# Patient Record
Sex: Female | Born: 1945 | Race: White | Hispanic: No | Marital: Married | Smoking: Former smoker
Health system: Southern US, Community
[De-identification: ages and names within clinical notes are randomized; demographics above are authoritative.]

## PROBLEM LIST (undated history)

## (undated) DIAGNOSIS — R609 Edema, unspecified: Secondary | ICD-10-CM

## (undated) DIAGNOSIS — G629 Polyneuropathy, unspecified: Secondary | ICD-10-CM

## (undated) DIAGNOSIS — T7840XA Allergy, unspecified, initial encounter: Secondary | ICD-10-CM

## (undated) DIAGNOSIS — G600 Hereditary motor and sensory neuropathy: Secondary | ICD-10-CM

## (undated) DIAGNOSIS — R251 Tremor, unspecified: Secondary | ICD-10-CM

## (undated) DIAGNOSIS — R0602 Shortness of breath: Secondary | ICD-10-CM

## (undated) DIAGNOSIS — M1990 Unspecified osteoarthritis, unspecified site: Secondary | ICD-10-CM

## (undated) DIAGNOSIS — S43429A Sprain of unspecified rotator cuff capsule, initial encounter: Principal | ICD-10-CM

## (undated) DIAGNOSIS — E785 Hyperlipidemia, unspecified: Secondary | ICD-10-CM

## (undated) DIAGNOSIS — M549 Dorsalgia, unspecified: Secondary | ICD-10-CM

## (undated) DIAGNOSIS — G8929 Other chronic pain: Secondary | ICD-10-CM

## (undated) DIAGNOSIS — IMO0002 Reserved for concepts with insufficient information to code with codable children: Secondary | ICD-10-CM

## (undated) DIAGNOSIS — K5790 Diverticulosis of intestine, part unspecified, without perforation or abscess without bleeding: Secondary | ICD-10-CM

## (undated) HISTORY — PX: SPINE SURGERY: SHX786

## (undated) HISTORY — PX: ABDOMINAL HYSTERECTOMY: SHX81

## (undated) HISTORY — DX: Hyperlipidemia, unspecified: E78.5

## (undated) HISTORY — PX: KNEE SURGERY: SHX244

## (undated) HISTORY — DX: Unspecified osteoarthritis, unspecified site: M19.90

## (undated) HISTORY — DX: Allergy, unspecified, initial encounter: T78.40XA

## (undated) HISTORY — PX: BACK SURGERY: SHX140

## (undated) HISTORY — DX: Reserved for concepts with insufficient information to code with codable children: IMO0002

## (undated) HISTORY — PX: CHOLECYSTECTOMY: SHX55

## (undated) HISTORY — PX: TONSILLECTOMY: SHX5217

## (undated) HISTORY — PX: FRACTURE SURGERY: SHX138

## (undated) HISTORY — PX: FOOT SURGERY: SHX648

---

## 1999-11-21 ENCOUNTER — Encounter: Payer: Self-pay | Admitting: Orthopedic Surgery

## 1999-11-21 ENCOUNTER — Encounter: Admission: RE | Admit: 1999-11-21 | Discharge: 1999-11-21 | Payer: Self-pay | Admitting: Orthopedic Surgery

## 1999-12-25 ENCOUNTER — Encounter: Payer: Self-pay | Admitting: Orthopedic Surgery

## 1999-12-25 ENCOUNTER — Ambulatory Visit (HOSPITAL_COMMUNITY): Admission: RE | Admit: 1999-12-25 | Discharge: 1999-12-25 | Payer: Self-pay | Admitting: Orthopedic Surgery

## 2000-01-08 ENCOUNTER — Ambulatory Visit (HOSPITAL_COMMUNITY): Admission: RE | Admit: 2000-01-08 | Discharge: 2000-01-08 | Payer: Self-pay | Admitting: Orthopedic Surgery

## 2000-01-08 ENCOUNTER — Encounter: Payer: Self-pay | Admitting: Orthopedic Surgery

## 2000-01-22 ENCOUNTER — Encounter: Payer: Self-pay | Admitting: Orthopedic Surgery

## 2000-01-22 ENCOUNTER — Ambulatory Visit (HOSPITAL_COMMUNITY): Admission: RE | Admit: 2000-01-22 | Discharge: 2000-01-22 | Payer: Self-pay | Admitting: Orthopedic Surgery

## 2000-01-25 ENCOUNTER — Encounter: Payer: Self-pay | Admitting: Specialist

## 2000-01-25 ENCOUNTER — Encounter: Admission: RE | Admit: 2000-01-25 | Discharge: 2000-01-25 | Payer: Self-pay | Admitting: Specialist

## 2000-09-17 ENCOUNTER — Encounter: Payer: Self-pay | Admitting: Orthopedic Surgery

## 2000-09-17 ENCOUNTER — Ambulatory Visit (HOSPITAL_COMMUNITY): Admission: RE | Admit: 2000-09-17 | Discharge: 2000-09-17 | Payer: Self-pay | Admitting: Orthopedic Surgery

## 2001-02-11 ENCOUNTER — Encounter: Admission: RE | Admit: 2001-02-11 | Discharge: 2001-02-11 | Payer: Self-pay | Admitting: Obstetrics and Gynecology

## 2001-02-11 ENCOUNTER — Encounter: Payer: Self-pay | Admitting: Obstetrics and Gynecology

## 2001-05-05 ENCOUNTER — Encounter: Payer: Self-pay | Admitting: Neurosurgery

## 2001-05-07 ENCOUNTER — Inpatient Hospital Stay (HOSPITAL_COMMUNITY): Admission: RE | Admit: 2001-05-07 | Discharge: 2001-05-10 | Payer: Self-pay | Admitting: Neurosurgery

## 2001-05-07 ENCOUNTER — Encounter: Payer: Self-pay | Admitting: Neurosurgery

## 2001-06-06 ENCOUNTER — Encounter: Payer: Self-pay | Admitting: Neurosurgery

## 2001-06-06 ENCOUNTER — Ambulatory Visit (HOSPITAL_COMMUNITY): Admission: RE | Admit: 2001-06-06 | Discharge: 2001-06-06 | Payer: Self-pay | Admitting: Neurosurgery

## 2001-08-06 ENCOUNTER — Encounter: Payer: Self-pay | Admitting: Neurosurgery

## 2001-08-06 ENCOUNTER — Ambulatory Visit (HOSPITAL_COMMUNITY): Admission: RE | Admit: 2001-08-06 | Discharge: 2001-08-06 | Payer: Self-pay | Admitting: Neurosurgery

## 2001-08-11 ENCOUNTER — Encounter: Admission: RE | Admit: 2001-08-11 | Discharge: 2001-11-09 | Payer: Self-pay | Admitting: Neurosurgery

## 2002-02-19 ENCOUNTER — Encounter: Admission: RE | Admit: 2002-02-19 | Discharge: 2002-02-19 | Payer: Self-pay | Admitting: Specialist

## 2002-02-19 ENCOUNTER — Encounter: Payer: Self-pay | Admitting: Specialist

## 2002-03-11 ENCOUNTER — Encounter: Payer: Self-pay | Admitting: Neurosurgery

## 2002-03-11 ENCOUNTER — Encounter: Admission: RE | Admit: 2002-03-11 | Discharge: 2002-03-11 | Payer: Self-pay | Admitting: Neurosurgery

## 2002-11-17 ENCOUNTER — Encounter: Payer: Self-pay | Admitting: Family Medicine

## 2002-11-17 ENCOUNTER — Ambulatory Visit (HOSPITAL_COMMUNITY): Admission: RE | Admit: 2002-11-17 | Discharge: 2002-11-17 | Payer: Self-pay | Admitting: Family Medicine

## 2003-04-23 ENCOUNTER — Encounter: Admission: RE | Admit: 2003-04-23 | Discharge: 2003-04-23 | Payer: Self-pay | Admitting: Specialist

## 2003-04-23 ENCOUNTER — Encounter: Payer: Self-pay | Admitting: Specialist

## 2006-06-15 ENCOUNTER — Emergency Department (HOSPITAL_COMMUNITY): Admission: EM | Admit: 2006-06-15 | Discharge: 2006-06-15 | Payer: Self-pay | Admitting: Emergency Medicine

## 2007-11-26 ENCOUNTER — Encounter: Admission: RE | Admit: 2007-11-26 | Discharge: 2007-11-26 | Payer: Self-pay | Admitting: Neurosurgery

## 2007-12-09 ENCOUNTER — Inpatient Hospital Stay (HOSPITAL_COMMUNITY): Admission: RE | Admit: 2007-12-09 | Discharge: 2007-12-12 | Payer: Self-pay | Admitting: Neurosurgery

## 2008-02-15 ENCOUNTER — Encounter: Admission: RE | Admit: 2008-02-15 | Discharge: 2008-02-15 | Payer: Self-pay | Admitting: Neurosurgery

## 2008-06-10 ENCOUNTER — Encounter: Admission: RE | Admit: 2008-06-10 | Discharge: 2008-06-10 | Payer: Self-pay | Admitting: Neurosurgery

## 2008-07-05 ENCOUNTER — Inpatient Hospital Stay (HOSPITAL_COMMUNITY): Admission: RE | Admit: 2008-07-05 | Discharge: 2008-07-07 | Payer: Self-pay | Admitting: Neurosurgery

## 2008-10-12 ENCOUNTER — Encounter: Admission: RE | Admit: 2008-10-12 | Discharge: 2008-10-12 | Payer: Self-pay | Admitting: Neurosurgery

## 2010-12-25 ENCOUNTER — Ambulatory Visit: Payer: Medicare Other | Attending: Rehabilitation | Admitting: *Deleted

## 2010-12-25 DIAGNOSIS — M6281 Muscle weakness (generalized): Secondary | ICD-10-CM | POA: Insufficient documentation

## 2010-12-25 DIAGNOSIS — IMO0001 Reserved for inherently not codable concepts without codable children: Secondary | ICD-10-CM | POA: Insufficient documentation

## 2010-12-25 DIAGNOSIS — R269 Unspecified abnormalities of gait and mobility: Secondary | ICD-10-CM | POA: Insufficient documentation

## 2010-12-29 ENCOUNTER — Encounter: Payer: Self-pay | Admitting: Occupational Therapy

## 2011-01-03 ENCOUNTER — Ambulatory Visit: Payer: Medicare Other | Attending: Rehabilitation | Admitting: Occupational Therapy

## 2011-01-03 DIAGNOSIS — M6281 Muscle weakness (generalized): Secondary | ICD-10-CM | POA: Insufficient documentation

## 2011-01-03 DIAGNOSIS — R269 Unspecified abnormalities of gait and mobility: Secondary | ICD-10-CM | POA: Insufficient documentation

## 2011-01-03 DIAGNOSIS — IMO0001 Reserved for inherently not codable concepts without codable children: Secondary | ICD-10-CM | POA: Insufficient documentation

## 2011-01-08 ENCOUNTER — Ambulatory Visit: Payer: Medicare Other | Admitting: *Deleted

## 2011-01-09 NOTE — Discharge Summary (Signed)
NAME:  Haley Kennedy, Haley Kennedy                 ACCOUNT NO.:  [id number]   MEDICAL RECORD NO.:  [id number]          PATIENT TYPE:  INP   LOCATION:  3015                         FACILITY:  [facility]   PHYSICIAN:  Sherilyn Cooter A. Pool, M.D.    DATE OF BIRTH:  May 28, 1946   DATE OF ADMISSION:  12/09/2007  DATE OF DISCHARGE:  12/12/2007                               DISCHARGE SUMMARY   FINAL DIAGNOSES:  1. L3-L4 degenerative disk disease with stenosis and persistent back      and lower extremity pain.  2. Unspecified neuromuscular disorder, chronic.   HISTORY OF PRESENT ILLNESS:  Ms. 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  The patient is status post previous L4-L5  fusion with reasonably good results.  She presents now with worsening  back and bilateral lower extremity symptoms.  Workup demonstrates  evidence of breakdown and stenosis at the L3-L4 level.  The patient  presents now for L3-4 decompression and fusion.   OPERATIVE NOTE:  The patient was taken to the operating room where an  uncomplicated L3-L4 decompression and fusion instrumentation was  performed.   HOSPITAL COURSE:  Postoperatively, the patient did well.  Her lower  extremity pain was much improved.  Her back pain was significantly  better.  Her neuromuscular disease did make mobilization somewhat more  problematic.  This was achieved with physical and occupational therapy.  The patient was gradually able to become independent again.  At the time  of discharge, she was ambulating with minimal assistance.  Her wound was  healing well.  Her neurological exam was stable.   CONDITION ON DISCHARGE:  Improved.   DISCHARGE DISPOSITION:  The patient will be discharged home.  She will  follow up in my office in 1 week.           ______________________________  Kathaleen Maser. Pool,  M.D.     HAP/MEDQ  D:  01/27/2008  T:  01/27/2008  Job:  [id number]

## 2011-01-09 NOTE — Op Note (Signed)
NAME:  Haley Kennedy, Haley Kennedy                 ACCOUNT NO.:  [id number]   MEDICAL RECORD NO.:  [id number]          PATIENT TYPE:  INP   LOCATION:  3015                         FACILITY:  [facility]   PHYSICIAN:  Sherilyn Cooter A. Pool, M.D.    DATE OF BIRTH:  [birth year]   DATE OF PROCEDURE:  12/09/2007  DATE OF DISCHARGE:                               OPERATIVE REPORT   PREOPERATIVE DIAGNOSES:  1. L3-L4 stenosis with disk degeneration and facet arthropathy and      evidence of segmental instability.  2. Status post L4-L5 decompression and fusion with instrumentation.   POSTOPERATIVE DIAGNOSES:  1. L3-L4 stenosis with disk degeneration and facet arthropathy and      evidence of segmental instability.  2. Status post L4-L5 decompression and fusion with instrumentation.   PROCEDURE:  1. Redo decompressive laminectomy of L3-L4 nerve roots bilaterally,      more than what we required for a interbody fusion alone.  2. L3-L4 posterior lumbar interbody fusion of the tangent interbody      allograft wedge, interbody PEEK cage, and local autografting.  3. L3-4 posterolateral arthrodesis utilizing nonsegmental pedicle      screw fixation and local autografting.  4. Re-exploration of L4-L5 fusion with removal of hardware.   SURGEON:  Kathaleen Maser. Pool, MD   ASSISTANT:  Reinaldo Meeker, MD   ANESTHESIA:  General.   INDICATIONS FOR PROCEDURE:  Ms. Irby is a 65 year old female with a  history of previous L4-L5 decompression and fusion done for a  degenerative spondylolisthesis.  The patient had progressively worsening  back and bilateral lower extremity pain over the past year.  Workup  demonstrates evidence of segmental breakdown at both the level of her  fusion with marked facet arthropathy and stenosis.  The patient was  counseled as to her options.  She decided proceed with L3-L4  decompression and fusion.  Preop workup suggests solid fusion at L4-L5.   OPERATION:  The patient placed on the operative table in a  supine  position.  After a level of anesthesia was achieved, the patient was  prone on Wilson frame firmly padded.  The patient's lumbar regions  prepped and draped.  A #10 blade was used to make a curvilinear skin  incision overlying the L2, L3, L4, and L5 levels.  This was carried down  sharply in the midline.  A subperiosteal dissection was then performed  exposing the lamina and facet joints of L3 as well as the facet complex  and instrumentation of L4-L5.  Deep self-retaining retractor was placed.  Intraoperative fluoroscopy was used and levels were confirmed.  The  pedicle screw fixation at L4-L5 was disassembled and removed.  The  fusion at L4-L5 was inspected and found to be quite solid.  A 6.75 x 40  mm radius screws were then placed into L4 using the preexisting holes  with good purchase.  The previous laminectomy defect extending up to the  inferior aspect of the lamina of L3 was dissected free.  A complete  laminectomy of L3 was then performed using Leksell rongeurs, Kerrison  rongeurs,  and high-speed drill.  Inferior facetectomies of L3 were  performed bilaterally.  Superior facetectomies of L4 were performed  bilaterally.  Ligamentum flavum and epidural scar were then elevated and  resected in usual fashion using Kerrison rongeurs in the thecal sac and  exiting L3-L4 nerve roots were identified bilaterally and wide  decompressive foraminotomies were then performed along the course of the  exiting nerve roots.  Wound was then irrigated.  Hemostasis was then  achieved with bipolar electrocautery.  Starting first at the patient's  left side, thecal sac nerve was gently mobilized and tracked towards the  midline.  Disk space then incised with #15 blade in a rectangular  fashion.  Wide disk space was achieved using pituitary rongeurs,  up and  downbiting micropituitary rongeurs, and Epstein curettes.  Disk space  then sequentially dilated up to 10 mm with a 10 mm distractor left  in  place.  Thecal sac nerve was reflected on the right side.  Diskectomy  was then performed.  Subsequently, the disk was then reamed and cut with  10 mm tangent instrument.  Soft tissues were then removed from the  interspace.  A 10 x 26 mm tangent wedge was then packed into place and  recessed roughly 1 mm from posterior cortical margin of L3-L4.  Distractors were removed from the patient's left side.  Thecal sac nerve  was reflected on the left side.  Disk space was once again reamed and  then cut with 10 mm tangent instruments.  Soft tissues were removed from  the interspace.  Disk space further curettaged.  Morselized autograft  was then packed into interspace.  A 10 x 26 mm Telamon cage packed with  morselized autograft and DuraGen matrix putty was then packed into place  and recessed roughly 1 mm from the posterior cortical margin of L3-L4.  Pedicles of L3 were then identified using surface landmarks and  intraoperative fluoroscopy bilaterally.  Superficial bone around the  pedicle was then removed using high-speed drill.  Each pedicle was then  probed using pedicle awl.  Pedicle awl tract was then tapped with a 5.25  mm screw tapper.  Each screw tap hole was probed and found to be solid  with bone.  A 6.75 x 45 mm radius screws were placed bilaterally at L3.  Transverse processes of L3-L4 were then decorticated using high-speed  drill.  Morselized autograft was packed posterolaterally for later  fusion.  Short segment titanium rod was placed over the screw heads at  L3-L4.  Locking caps were then placed over the screws.  Locking caps  were then engaged to construct under compression.  Final images revealed  good position of bone grafts and hardware with proper operative level  and normal alignment of spine.  Wound was then irrigated with antibiotic  solution.  Gelfoam was placed topically.  Hemostasis was found to be  good.  Retractor system was removed.  Hemostasis achieved  with  electrocautery.  The wound was then closed in layers of Vicryl sutures.  Steri-Strips and sterile dressing were applied.  There were no  complications.  She tolerated the procedure well and she returns to  recovery room postoperatively.           ______________________________  Kathaleen Maser Pool, M.D.     HAP/MEDQ  D:  12/09/2007  T:  12/10/2007  Job:  [id number]

## 2011-01-09 NOTE — Op Note (Signed)
NAME:  Haley Kennedy, Haley Kennedy                 ACCOUNT NO.:  [id number]   MEDICAL RECORD NO.:  [id number]          PATIENT TYPE:  INP   LOCATION:  3005                         FACILITY:  [facility]   PHYSICIAN:  Sherilyn Cooter A. Pool, M.D.    DATE OF BIRTH:  02/27/1946   DATE OF PROCEDURE:  07/05/2008  DATE OF DISCHARGE:                               OPERATIVE REPORT   PREOPERATIVE DIAGNOSIS:  L2-3 instability with stenosis.   POSTOPERATIVE DIAGNOSIS:  L2-3 instability with stenosis.   PROCEDURE NOTE:  Re-exploration of L3-4 fusion with removal of hardware.  L2-3 redo decompressive laminectomy and bilateral L2 and L3  foraminotomies, more than would be required for simple interbody fusion  alone.  L2-3 posterior lumbar interbody fusion utilizing tangent  interbody allograft wedge, Telamon interbody PEEK cage and local  autografting.  L2, L3, L4 posterolateral arthrodesis utilizing segmental  pedicle screw fixation and local autografting.   SURGEON:  Kathaleen Maser. Pool, MD   ASSISTANT:  Donalee Citrin, MD   ANESTHESIA:  General endotracheal.   INDICATIONS:  Haley Kennedy is a 65 year old female status post previous L3-4  and L4-5 decompression and fusion and instrumentation.  The patient has  remaining hardware in place at L3-4.  The patient has evidence of  instability with stenosis at the L2-3 level secondary to hyperlordosis.  The patient presents now for decompression and fusion at L2-3 in hopes  of improving her symptoms.   OPERATIVE NOTE:  The patient was brought to the operating room and  placed on the operating table in a supine position.  After adequate  level of anesthesia was achieved, the patient was placed prone onto a  Wilson frame.  Appropriately padded, the patient's lumbar regions were  prepped and draped sterilely.  A #10 blade was used to make a  curvilinear skin incision overlying the L2, L3, and L4 levels.  This was  carried down sharply in the midline.  A subperiosteal dissection was  then  performed exposing the lamina and facet joints of L2, L3, and L4 as  well as the pedicle screw fixation at L3-4.  Deep self-retaining  retractor was placed.  Intraoperative fluoroscopy was used and the  levels were confirmed.  Pedicle screw fixation at L3-4 was disassembled  and the rod was removed.  The fusion at L3-4 was explored and found to  be solid but as the fusion was less than 1 year in age it was felt that  it probably have not matured to the point where all instrumentation  could be completely removed.  Attention then placed to L2-3 level.  Decompressive laminectomy at L2 was then performed by first resecting  the epidural scar.  The entire lamina of L2 was then removed.  Inferior  facetectomies of L2 were performed bilaterally.  Superior facetectomies  of L3 performed bilaterally.  Epidural venous plexus was coagulated and  cut.  The ligamentum flavum and epidural scar were resected along the  course of exiting nerve roots.  Wide decompressive foraminotomies were  then performed along the course of exiting L2 and L3 nerve roots  bilaterally.  Bilateral diskectomies were then performed at L2-3.  Disk  space then subsequently distracted up to 12 mm using a 12-mm retractor  from the patient's right side.  Thecal sac and nerve root were protected  on the left side.  Disk space then reamed and then cut with 10-mm  tangent instrument.  Soft tissues then removed from the interspace.  A  10 x 22 mm Telamon cage packed with morselized autograft was then packed  into interspace and then recessed roughly 2 mm from posterior cortical  margin of L2.  Distractor was removed from the patient's right side.  Thecal sac and nerve roots were protected on the right side.  Disk space  once again reamed and then cut with a 12-mm tangent instrument.  Soft  tissues then removed from the interspace.  Soft tissues then further  removed from the interspace.  Morselized autograft was then impacted  into  interspace.  A 12 x 26 mm tangent wedge then impacted into place  and recessed roughly 1-2 mm from posterior cortical margin of L2.  Pedicles of L2 were then identified using surface landmarks using  intraoperative fluoroscopy.  The superficial bone overlying the pedicle  was then removed using high-speed drill.  Each pedicle was then probed  using pedicle awl.  Each pedicle awl track was then tapped with a 5.25  screw tapper.  Each screw tap hole was then probed and found to be solid  bone.  A 6.75 x 45  mm radius screws were placed bilaterally at L2.  Transverse processes of L2 and L3 were then decorticated using high  speed drill.  Morselized autograft was packed posterolaterally for later  fusion.  The short segment titanium rod was then contoured and placed  through the screw heads at L2, L3 and L4.  Locking caps were placed over  the screw heads and the locking caps were then engaged with the  construct under compression.  Final images revealed good position of  bone grafts, hardware with proper operative level and  normalized spine.  Transverse connector was placed.  A medium Hemovac drain was left in  interspace.  The wound was then irrigated one final time and then closed  in a typical fashion.  Steri-Strips and sterile dressings were applied.  There were no complications.  The patient tolerated the procedure well  and she returns to recovery room postoperatively.           ______________________________  Kathaleen Maser Pool, M.D.     HAP/MEDQ  D:  07/05/2008  T:  07/06/2008  Job:  [id number]

## 2011-01-11 ENCOUNTER — Ambulatory Visit: Payer: Medicare Other | Admitting: Occupational Therapy

## 2011-01-12 NOTE — Consult Note (Signed)
NAME:  Haley Kennedy, COLLA NO.:  [id number]   MEDICAL RECORD NO.:  [id number]          PATIENT TYPE:  EMS   LOCATION:  ED                           FACILITY:  [facility]   PHYSICIAN:  Erasmo Leventhal, M.D.DATE OF BIRTH:  February 22, 1946   DATE OF CONSULTATION:  06/15/2006  DATE OF DISCHARGE:                                   CONSULTATION   DATE OF CONSULTATION:  June 15, 2006.   TIME SEEN:  7 p.m.   HISTORY OF PRESENT ILLNESS:  Haley Kennedy is a very pleasant, 65 year old,  Caucasian female patient of Dr. Jeannetta Ellis.  She fell today sustaining an  injury to her left foot.  She presented to [facility] ED, was evaluated and  felt to have a possible fracture.  I was in the area and I was asked to see  the patient.   PAST MEDICAL HISTORY:  1. Polio.  2. She had surgery of her left foot.   PHYSICAL EXAMINATION:  GENERAL:  She is awake and alert.  She is a very  pleasant lady in moderate discomfort.  LEFT LOWER EXTREMITY:  Her left foot shows a congenital shortage.  She has a  2+ dorsal ecchymosis and swelling.  Compartments are soft.  She is tender in  her mid foot, and neurovascular examination is intact as far as color,  sensation, and capillary refill.  The ankle was unremarkable.   Plain x-rays were reviewed, and it looks like she has had the fusion; it  appears be a triple arthrodesis on plain x-ray.  There is no apparent acute  fracture, but again difficult to ascertain secondary to the old fusion.  CT  scan was recommended and obtained.  It shows the old fusion, what appears to  be a triple arthrodesis, and probably an acute fracture off the cuneiform.   IMPRESSION:  Left foot pain.  She has an old triple arthrodesis with an  acute cuneiform fracture.   RECOMMENDATIONS:  I discussed with patient and her husband and recommend  home, elevation to level of heart, ice, Jones dressing, CAM walker, non-  weightbearing.  Follow up in the office in about a week for  compare to our x-  rays in the office.  A prescription was given for Dilaudid.  All questions  were encouraged and answered by the patient and her husband.           ______________________________  Erasmo Leventhal, M.D.     RAC/MEDQ  D:  06/15/2006  T:  06/16/2006  Job:  [id number]

## 2011-01-12 NOTE — Discharge Summary (Signed)
[facility]  Patient:    Haley Kennedy, Haley Kennedy Visit Number: [id number] MRN: [id number]          Service Type: SUR Location: 3000 3004 01 Attending Physician:  Donn Pierini Dictated by:   Julio Sicks, M.D. Admit Date:  05/07/2001 Discharge Date: 05/10/2001                             Discharge Summary  DATE OF PROCEDURE:  May 07, 2001  SERVICE:  Neurosurgery.  PREOPERATIVE DIAGNOSIS:  L4-5 stenosis with instability and chronic pain.  POSTOPERATIVE DIAGNOSIS:  L4-5 stenosis with instability and chronic pain.  OPERATIONS/TREATMENTS: 1. L4-5 decompressive laminectomy with foraminotomy. 2. L4-5 posterior autobody fusion with tangent wedges and local autograft. 3. L4-5 posterior lateral fusion with pedicle screw instrumentation and local    autograft.  HISTORY OF PRESENT ILLNESS:  The patient is a 65 year old female with a history of chronic back and bilateral lower extremity pain.  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  She was walks with somewhat of a Trendelenburg gait and hyperlordotic posture.  MRI scanning demonstrates severe facet arthropathy with moderately severe spinal stenosis at L4-5.  We discussed options now for management.  She has failed conservative management.  She has decided to proceed with an L4-5 decompression and fusion in hopes of improving her symptoms.  HOSPITAL COURSE:  The patient was taken to the operating room where an uncomplicated L4-5 decompression and fusion procedure was performed.  Postoperatively, the patient awakened neurologically intact.  She had moderate severe back pain, but her lower extremity pain was improved.  Neurological function was stable from her preoperative exam.  Her wound was healing well. She was gradually mobilized with the assistance of therapy.  At the time of discharge, the patients pain was  well-controlled on oral medications.  She states that her legs feel improved.  Her wound was healing well.  CONDITION ON DISCHARGE:  Improved.  FOLLOW-UP APPOINTMENT:  Follow up is in one week in my office. Dictated by:   Julio Sicks, M.D. Attending Physician:  Donn Pierini DD:  06/04/01 TD:  06/05/01 Job: [id number]

## 2011-01-12 NOTE — Op Note (Signed)
[facility]  Patient:    Haley Kennedy, Haley Kennedy Visit Number: [id number] MRN: [id number]          Service Type: SUR Location: [id number] Attending Physician:  Donn Pierini Dictated by:   Julio Sicks, M.D. Proc. Date: 05/07/01 Admit Date:  05/07/2001                             Operative Report  PREOPERATIVE DIAGNOSES: 1. 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 2. 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  PROCEDURES: 1. L4-5 decompressive lumbar laminectomy with foraminotomies. 2. L4-5 posterior lumbar interbody fusion using Tangent wedges and local    autograft. 3. L4-5 posterolateral fusion utilizing pedicle screw fixation and local    autograft.  SURGEON:  Julio Sicks, M.D.  ASSISTANT:  Donalee Citrin, Montez Hageman., M.D.  ANESTHESIA:  General endotracheal.  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 She presents with worsening back and bilateral lower extremity pain.  The patient has MRI scan which demonstrates rather marked stenosis at L4-5.  She has some hyperlordosis at this region causing a rather marked facet arthropathy.  We discussed options available for management.  She has failed all efforts at conservative care.  We discussed the possibility of undergoing an L4-5 decompression and fusion procedure for hopes of alleviating some of her symptoms.  The patient is aware of the risks and benefits and wishes to proceed.  DESCRIPTION OF PROCEDURE:  Patient taken to the operating room, placed on the operating table in the supine position.  After an adequate level of anesthesia was achieved, the patient was positioned prone onto a Wilson frame, appropriately padded.  The patients lumbar region was prepped and draped sterilely.  A 10 blade was used to make a linear skin incision overlying L3, L4, and L5.  This was carried down sharply to the midline.  A  subperiosteal dissection was then performed, exposing the laminae and facet joints at L3, L4, and L5, as well as the transverse processes of L4 and L5.  Deep self-retaining retractor was placed.  Intraoperative fluoroscopy was used, and the L4-5 level was confirmed.  A wide decompressive laminectomy was then performed using the high-speed drill, Kerrison rongeurs, and Leksell rongeurs to completely remove the lamina of L4, completely remove the inferior facets of L4, and mostly remove the superior facets of L5.  All bone was cleaned and used for later autografting.  The ligamentum flavum was then elevated and resected in a piecemeal fashion using Kerrison rongeurs.  The underlying thecal sac and exiting L4 and L5 nerve roots were identified and widely decompressed along their course.  Epidural venous plexus was coagulated and cut.  Thecal sac was mobilized toward the midline.  Starting first on the patients left side, the disk space was isolated, incised with a 15 blade in a rectangular fashion.  A wide disk space clean-out was then achieved using pituitary rongeurs, upward-angled pituitary rongeurs, and Epstein curettes. After an aggressive diskectomy was performed on this side, the procedure was repeated on the contralateral side.  The disk space was then sequentially distracted to 10 mm.  Starting first in the patients left side with a distractor on the patients right side, the nerve roots were protected, disk space was entered with a reamer, and the disk space was reamed with a 10 mm reamer.  The disk space was then cleaned.  It was then cut with a 10 mm chisel.  A 10 x 26 mm Tangent wedge was then impacted into place, recessed approximately 2 mm from the posterior cortical surface.  The distractor was removed from the contralateral side.  There was no evidence of injury to thecal sac or nerve root.  The procedure was then repeated on the contralateral side, again without complication.   Prior to installation of the second bone graft, morcellized autograft was packed into the interspace for later use in fusion.  The second wedge was well-positioned and confirmed by fluoroscopy to be in a good place.  Attention was then placed to placing pedicle screw instrumentation.  The pedicles at L4 and L5 were isolated by both surface landmarks and fluoroscopic guidance.  Superficial bone overlying the pedicle was removed using the high-speed drill.  The pedicle was then entered using a pedicle awl under fluoroscopic guidance.  Each pedicle awl track was found to be solidly within bone.  Each pedicle awl track was then tapped with a 5.25 mm tap.  Each pedicle tapped hole was probed and found to be solidly within bone.  STRS variable-angled 6.75 x 40 mm pedicle screws were then placed at L4 and L5 bilaterally.  All screws were found to be well-positioned.  Transverse processes of L4 and L5 were then decorticated using high-speed drill.  A short segment of titanium rod was then placed over the screw heads at L4 and L5 and contoured appropriately.  Locking caps were placed in the screw heads.  The locking caps were then engaged in a sequential fashion to place the construct under compression.  Final images revealed good position of the bone grafts and hardware, with the proper operative level and normal alignment of the spine.  A blunt probe was passed easily along the course of the L4 and L5 nerve roots bilaterally.  There was no evidence of any residual stenosis.  The wound was irrigated with antibiotic solution. Morcellized autograft was packed posterolaterally for fusion.  Gelfoam was left in the epidural space.  A medium Hemovac drain was left in the epidural space.  The wound was then closed in layers with Vicryl sutures.  Steri-Strips and a sterile dressing were applied.  There were no complications.  The patient tolerated the procedure well, and she returns to the recovery  room postop. Dictated by:   Julio Sicks, M.D. Attending Physician:  Donn Pierini DD:  05/07/01 TD:  05/07/01 Job: [id number]

## 2011-01-15 ENCOUNTER — Ambulatory Visit: Payer: Medicare Other | Admitting: *Deleted

## 2011-01-17 ENCOUNTER — Ambulatory Visit: Payer: Medicare Other | Admitting: Occupational Therapy

## 2011-01-17 ENCOUNTER — Ambulatory Visit: Payer: Medicare Other | Admitting: *Deleted

## 2011-01-18 ENCOUNTER — Ambulatory Visit: Payer: Medicare Other | Admitting: Occupational Therapy

## 2011-01-18 ENCOUNTER — Ambulatory Visit: Payer: Medicare Other | Admitting: Physical Therapy

## 2011-01-24 ENCOUNTER — Ambulatory Visit: Payer: Medicare Other | Admitting: Rehabilitative and Restorative Service Providers"

## 2011-01-24 ENCOUNTER — Ambulatory Visit: Payer: Medicare Other | Admitting: Occupational Therapy

## 2011-01-24 ENCOUNTER — Ambulatory Visit: Payer: Medicare Other | Admitting: *Deleted

## 2011-01-25 ENCOUNTER — Ambulatory Visit: Payer: Medicare Other | Admitting: Occupational Therapy

## 2011-01-29 ENCOUNTER — Ambulatory Visit: Payer: Medicare Other | Admitting: Occupational Therapy

## 2011-01-29 ENCOUNTER — Ambulatory Visit: Payer: Medicare Other | Attending: Rehabilitation | Admitting: *Deleted

## 2011-01-29 DIAGNOSIS — R269 Unspecified abnormalities of gait and mobility: Secondary | ICD-10-CM | POA: Insufficient documentation

## 2011-01-29 DIAGNOSIS — M6281 Muscle weakness (generalized): Secondary | ICD-10-CM | POA: Insufficient documentation

## 2011-01-29 DIAGNOSIS — IMO0001 Reserved for inherently not codable concepts without codable children: Secondary | ICD-10-CM | POA: Insufficient documentation

## 2011-01-31 ENCOUNTER — Ambulatory Visit: Payer: Medicare Other | Admitting: Occupational Therapy

## 2011-01-31 ENCOUNTER — Ambulatory Visit: Payer: Medicare Other | Admitting: *Deleted

## 2011-02-05 ENCOUNTER — Ambulatory Visit: Payer: Medicare Other | Admitting: Physical Therapy

## 2011-02-05 ENCOUNTER — Ambulatory Visit: Payer: Medicare Other | Admitting: Occupational Therapy

## 2011-02-07 ENCOUNTER — Ambulatory Visit: Payer: Medicare Other | Admitting: Occupational Therapy

## 2011-02-07 ENCOUNTER — Ambulatory Visit: Payer: Medicare Other | Admitting: Physical Therapy

## 2011-02-12 ENCOUNTER — Ambulatory Visit: Payer: Medicare Other | Admitting: Occupational Therapy

## 2011-02-12 ENCOUNTER — Ambulatory Visit: Payer: Medicare Other | Admitting: Physical Therapy

## 2011-02-15 ENCOUNTER — Ambulatory Visit: Payer: Medicare Other | Admitting: Occupational Therapy

## 2011-02-15 ENCOUNTER — Ambulatory Visit: Payer: Medicare Other | Admitting: Physical Therapy

## 2011-02-19 ENCOUNTER — Encounter: Payer: Medicare Other | Admitting: Occupational Therapy

## 2011-02-21 ENCOUNTER — Ambulatory Visit: Payer: Medicare Other | Admitting: Occupational Therapy

## 2011-02-22 ENCOUNTER — Ambulatory Visit: Payer: Medicare Other | Admitting: Internal Medicine

## 2011-02-26 ENCOUNTER — Encounter: Payer: Medicare Other | Admitting: Occupational Therapy

## 2011-03-01 ENCOUNTER — Encounter: Payer: Medicare Other | Admitting: Occupational Therapy

## 2011-03-15 ENCOUNTER — Encounter: Payer: Self-pay | Admitting: Internal Medicine

## 2011-03-15 ENCOUNTER — Ambulatory Visit (INDEPENDENT_AMBULATORY_CARE_PROVIDER_SITE_OTHER): Payer: Medicare Other | Admitting: Internal Medicine

## 2011-03-15 VITALS — BP 120/80 | HR 88 | Temp 98.1°F | Resp 18 | Ht 66.0 in | Wt 192.0 lb

## 2011-03-15 DIAGNOSIS — M6281 Muscle weakness (generalized): Secondary | ICD-10-CM

## 2011-03-15 DIAGNOSIS — R29898 Other symptoms and signs involving the musculoskeletal system: Secondary | ICD-10-CM

## 2011-03-15 MED ORDER — PROPRANOLOL HCL 10 MG PO TABS: 10.0000 mg | ORAL_TABLET | Freq: Three times a day (TID) | ORAL | Status: DC

## 2011-03-15 MED ORDER — FUROSEMIDE 40 MG PO TABS: 40.0000 mg | ORAL_TABLET | Freq: Two times a day (BID) | ORAL | Status: DC

## 2011-03-15 NOTE — Patient Instructions (Signed)
Limit your sodium (Salt) intake  Return in 3 months for follow-up   

## 2011-03-15 NOTE — Progress Notes (Signed)
Subjective:    Patient ID: Haley Kennedy, female    DOB: May 28, 1946, 65 y.o.   MRN: [id number]  HPI  65 year old patient who is in today for followup. She is from [place] and is here today to establish with our practice. She has a complicated past medical history that includes lumbar disc disease she has done poorly since a third operation in November of 2009. She apparently sustained some nerve damage and now is ambulatory with a 4. walker. She continues to have lower extremity weakness. She also has developed lower extremity edema distal to the knees. She has gained 30 pounds since October of 2010 when she had knee surgery. She has been evaluated at [facility] at the spine Center by Dr. Marikay Alar. She has completed physical therapy at home rehabilitation. She is requesting a referral for additional therapy in [place].  She has been evaluated by Dr. love in the past and has a diagnosis of Charcot-Marie-Tooth with Karrie Meres syndrome. She apparently takes propranolol as needed for this condition.  Surgical procedures have included a cholecystectomy in 1997 tonsillectomy and hysterectomy she has had 3 back surgeries as well as knee surgery and remote ankle surgery in 1963   Review of Systems  Constitutional: Positive for unexpected weight change.  HENT: Negative for hearing loss, congestion, sore throat, rhinorrhea, dental problem, sinus pressure and tinnitus.   Eyes: Negative for pain, discharge and visual disturbance.  Respiratory: Negative for cough and shortness of breath.   Cardiovascular: Positive for leg swelling. Negative for chest pain and palpitations.  Gastrointestinal: Negative for nausea, vomiting, abdominal pain, diarrhea, constipation, blood in stool and abdominal distention.  Genitourinary: Negative for dysuria, urgency, frequency, hematuria, flank pain, vaginal bleeding, vaginal discharge, difficulty urinating, vaginal pain and pelvic pain.  Musculoskeletal: Positive for back  pain and gait problem. Negative for joint swelling and arthralgias.  Skin: Negative for rash.  Neurological: Negative for dizziness, syncope, speech difficulty, weakness, numbness and headaches.  Hematological: Negative for adenopathy.  Psychiatric/Behavioral: Negative for behavioral problems, dysphoric mood and agitation. The patient is not nervous/anxious.        Objective:   Physical Exam  Constitutional: She is oriented to person, place, and time. She appears well-developed and well-nourished.  HENT:  Head: Normocephalic and atraumatic.  Right Ear: External ear normal.  Left Ear: External ear normal.  Mouth/Throat: Oropharynx is clear and moist.  Eyes: Conjunctivae and EOM are normal.  Neck: Normal range of motion. Neck supple. No JVD present. No thyromegaly present.  Cardiovascular: Normal rate, regular rhythm, normal heart sounds and intact distal pulses.   No murmur heard. Pulmonary/Chest: Effort normal and breath sounds normal. She has no wheezes. She has no rales.  Abdominal: Soft. Bowel sounds are normal. She exhibits no distension and no mass. There is no tenderness. There is no rebound and no guarding.  Musculoskeletal: Normal range of motion. She exhibits edema. She exhibits no tenderness.       +2 edema distal to the knees Orthotic devices in place involving the lower extremities bilaterally  Walks with a walker with lower extremity weakness and footdrop  Neurological: She is alert and oriented to person, place, and time. She has normal reflexes. No cranial nerve deficit. She exhibits normal muscle tone. Coordination normal.  Skin: Skin is warm and dry. No rash noted.  Psychiatric: She has a normal mood and affect. Her behavior is normal.          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

## 2011-03-19 ENCOUNTER — Telehealth: Payer: Self-pay | Admitting: *Deleted

## 2011-03-19 NOTE — Telephone Encounter (Signed)
Pt cannot take Lasix 40 mg. Due to numbness in both legs.  Has gone back on HCTZ., and wants Dr. Kirtland Bouchard to know.

## 2011-05-07 ENCOUNTER — Encounter: Payer: Self-pay | Admitting: Internal Medicine

## 2011-05-07 ENCOUNTER — Ambulatory Visit (INDEPENDENT_AMBULATORY_CARE_PROVIDER_SITE_OTHER): Payer: Medicare Other | Admitting: Internal Medicine

## 2011-05-07 VITALS — BP 132/80 | Temp 97.7°F

## 2011-05-07 DIAGNOSIS — M25519 Pain in unspecified shoulder: Secondary | ICD-10-CM

## 2011-05-07 DIAGNOSIS — M25511 Pain in right shoulder: Secondary | ICD-10-CM

## 2011-05-07 NOTE — Progress Notes (Signed)
  Subjective:    Patient ID: Haley Kennedy, female    DOB: 1946-07-03, 65 y.o.   MRN: [id number]  HPI  65 year old patient who is seen today in followup. She has a history of chronic lower extremity weakness and is followed at [facility] neurology. She has also been referred to their MS clinic due to her chronic lower extremity weakness. Approximately 2 weeks ago she fell sustaining trauma to her right shoulder she has had persistent pain and some limitation of motion due to the pain. She feels the shoulder is slowly improving    Review of Systems  HENT: Negative for hearing loss, congestion, sore throat, rhinorrhea, dental problem, sinus pressure and tinnitus.   Eyes: Negative for pain, discharge and visual disturbance.  Respiratory: Negative for cough and shortness of breath.   Cardiovascular: Negative for chest pain, palpitations and leg swelling.  Gastrointestinal: Negative for nausea, vomiting, abdominal pain, diarrhea, constipation, blood in stool and abdominal distention.  Genitourinary: Negative for dysuria, urgency, frequency, hematuria, flank pain, vaginal bleeding, vaginal discharge, difficulty urinating, vaginal pain and pelvic pain.  Musculoskeletal: Positive for gait problem. Negative for joint swelling and arthralgias.       Right shoulder pain  Skin: Negative for rash.  Neurological: Positive for weakness. Negative for dizziness, syncope, speech difficulty, numbness and headaches.  Hematological: Negative for adenopathy.  Psychiatric/Behavioral: Negative for behavioral problems, dysphoric mood and agitation. The patient is not nervous/anxious.        Objective:   Physical Exam  Constitutional: She is oriented to person, place, and time. She appears well-developed and well-nourished. No distress.       Alert and oriented  HENT:  Head: Normocephalic.  Right Ear: External ear normal.  Left Ear: External ear normal.  Mouth/Throat: Oropharynx is clear and moist.  Eyes:  Conjunctivae and EOM are normal. Pupils are equal, round, and reactive to light.  Neck: Normal range of motion. Neck supple. No thyromegaly present.  Cardiovascular: Normal rate, regular rhythm, normal heart sounds and intact distal pulses.   Pulmonary/Chest: Effort normal and breath sounds normal.  Abdominal: Soft. Bowel sounds are normal. She exhibits no mass. There is no tenderness.  Musculoskeletal: Normal range of motion.       Patient could abduct her right arm to 90 only without pain  Lymphadenopathy:    She has no cervical adenopathy.  Neurological: She is alert and oriented to person, place, and time.       Lower extremity weakness. Requires assistance to stand from a sitting position  Skin: Skin is warm and dry. No rash noted.  Psychiatric: She has a normal mood and affect. Her behavior is normal.          Assessment & Plan:   Right shoulder pain- traumatic.  Options were discussed. The patient seems to be improving slowly she wishes to defer orthopedic referral at this time but will call the office if she continues to have pain and limitation of motion

## 2011-05-07 NOTE — Patient Instructions (Signed)
Limit your sodium (Salt) intake  Call for orthopedic referral if your right shoulder pain persists

## 2011-05-22 ENCOUNTER — Telehealth: Payer: Self-pay | Admitting: Internal Medicine

## 2011-05-22 LAB — CBC
HCT: 26.4 — ABNORMAL LOW
Hemoglobin: 13.5
MCHC: 34.1
MCV: 89.3
MCV: 90.5
RBC: 4.42
RBC: 2.92 — ABNORMAL LOW
WBC: 9.5

## 2011-05-22 LAB — BASIC METABOLIC PANEL
CO2: 30
Chloride: 104
Creatinine, Ser: 0.52
GFR calc Af Amer: >60
Sodium: 141

## 2011-05-22 NOTE — Telephone Encounter (Signed)
[facility] is req 2-3 of pts main dx codes re: pts wheel cheel req. Pls call will code info. Order will be faxed to pcp, when dx codes have been rcvd.

## 2011-05-23 NOTE — Telephone Encounter (Signed)
781.2 unsteady gait 716.90 lower exterm. Weakness 728.87 arthritis  Attempt to call [facility] - got ans mach at equipment dept - left msg with all 3 code

## 2011-05-28 ENCOUNTER — Telehealth: Payer: Self-pay | Admitting: *Deleted

## 2011-05-28 DIAGNOSIS — M25511 Pain in right shoulder: Secondary | ICD-10-CM

## 2011-05-28 NOTE — Telephone Encounter (Signed)
Ortho referral  

## 2011-05-28 NOTE — Telephone Encounter (Signed)
Pt would like a referral to a specialist for her right shoulder pain.  Dr. Kirtland Bouchard advised her to call if her arm did not get better, and he would refer her.

## 2011-05-29 ENCOUNTER — Other Ambulatory Visit: Payer: Self-pay | Admitting: *Deleted

## 2011-05-29 DIAGNOSIS — M25511 Pain in right shoulder: Secondary | ICD-10-CM

## 2011-05-29 LAB — CBC
HCT: 39.6
Hemoglobin: 13.4
Hemoglobin: 8.9 — ABNORMAL LOW
MCHC: 33.9
MCHC: 34
RBC: 4.37
RDW: 12.8
RDW: 13

## 2011-05-29 LAB — TYPE AND SCREEN

## 2011-05-29 LAB — PREPARE RBC (CROSSMATCH)

## 2011-05-29 LAB — DIFFERENTIAL
Basophils Absolute: 0
Basophils Relative: 1
Eosinophils Relative: 2
Lymphocytes Relative: 37
Monocytes Absolute: 0.5
Monocytes Relative: 6

## 2011-05-29 NOTE — Telephone Encounter (Signed)
Please schedule orthopedic referral 

## 2011-06-14 ENCOUNTER — Telehealth: Payer: Self-pay | Admitting: Internal Medicine

## 2011-06-14 NOTE — Telephone Encounter (Signed)
Pt would like kim to return her call personally

## 2011-06-14 NOTE — Telephone Encounter (Signed)
Spoke with pt - she is to be eval by ortho for rotator cuff tear in shoulder - really doesn't really want because of her physical condition. I explained to go anyway - ask doctor what could be done to assist her during recovery.  Also would like metoprolol 50mg  rx'd - not taking propranolol - other help tremors in the past.  Also thinks she has a rectal teat that has been there for several months but has not had evaled because we dont have a table she can get up on. She is embarrassed but feels that something needs to be done. What to do?  Please call or advise

## 2011-06-14 NOTE — Telephone Encounter (Signed)
Generic Toprol-XL 50 mg one daily as needed for tremor #60 okay to refill We'll be glad to see her to evaluate possible rectal pathology as needed. Does she need referral for colonoscopy. This can be evaluated at that time if she is due for this procedure

## 2011-06-15 ENCOUNTER — Telehealth: Payer: Self-pay | Admitting: Internal Medicine

## 2011-06-15 MED ORDER — METOPROLOL SUCCINATE ER 50 MG PO TB24: 50.0000 mg | ORAL_TABLET | Freq: Every day | ORAL | Status: DC

## 2011-06-15 NOTE — Telephone Encounter (Signed)
Spoke with pt - saw surgeon today - needs to move forward with cuff surg. , needs clearance letter.

## 2011-06-15 NOTE — Telephone Encounter (Signed)
Spoke with pt- instructions given - med added and ordered. Will call if need to eval rectal irratation

## 2011-06-15 NOTE — Telephone Encounter (Signed)
Requesting a letter of clearance for surgery on her rotator cuff. Please fax letter of clearance to [phone] attn: Natasha Mead @ [facility]. Thanks.

## 2011-06-15 NOTE — Telephone Encounter (Signed)
All okay Office visit next week if patient desires

## 2011-06-18 NOTE — Telephone Encounter (Signed)
dictated

## 2011-06-18 NOTE — Telephone Encounter (Signed)
faxed

## 2011-06-27 ENCOUNTER — Encounter (HOSPITAL_COMMUNITY): Payer: Self-pay | Admitting: Certified Registered"

## 2011-06-27 ENCOUNTER — Encounter (HOSPITAL_COMMUNITY): Payer: Self-pay | Admitting: Pharmacy Technician

## 2011-06-28 ENCOUNTER — Encounter (HOSPITAL_COMMUNITY)
Admission: RE | Admit: 2011-06-28 | Discharge: 2011-06-28 | Disposition: A | Discharge status: Home / Self Care | Payer: Medicare Other | Source: Ambulatory Visit | Attending: Orthopedic Surgery | Admitting: Orthopedic Surgery

## 2011-06-28 ENCOUNTER — Encounter (HOSPITAL_COMMUNITY): Payer: Self-pay

## 2011-06-28 HISTORY — DX: Polyneuropathy, unspecified: G62.9

## 2011-06-28 HISTORY — DX: Shortness of breath: R06.02

## 2011-06-28 HISTORY — DX: Edema, unspecified: R60.9

## 2011-06-28 HISTORY — DX: Tremor, unspecified: R25.1

## 2011-06-28 HISTORY — DX: Diverticulosis of intestine, part unspecified, without perforation or abscess without bleeding: K57.90

## 2011-06-28 HISTORY — DX: Other chronic pain: G89.29

## 2011-06-28 HISTORY — DX: Dorsalgia, unspecified: M54.9

## 2011-06-28 LAB — URINALYSIS, ROUTINE W REFLEX MICROSCOPIC
Ketones, ur: NEGATIVE mg/dL
Nitrite: NEGATIVE
Protein, ur: NEGATIVE mg/dL
Urobilinogen, UA: 1 mg/dL (ref 0.0–1.0)

## 2011-06-28 LAB — DIFFERENTIAL
Basophils Absolute: 0 10*3/uL (ref 0.0–0.1)
Basophils Relative: 0 % (ref 0–1)
Eosinophils Absolute: 0.2 10*3/uL (ref 0.0–0.7)
Eosinophils Relative: 3 % (ref 0–5)
Neutrophils Relative %: 54 % (ref 43–77)

## 2011-06-28 LAB — URINE MICROSCOPIC-ADD ON

## 2011-06-28 LAB — BASIC METABOLIC PANEL
CO2: 28 mEq/L (ref 19–32)
Calcium: 10 mg/dL (ref 8.4–10.5)
Creatinine, Ser: 0.4 mg/dL — ABNORMAL LOW (ref 0.50–1.10)
GFR calc Af Amer: >90 mL/min (ref >90)
Sodium: 142 mEq/L (ref 135–145)

## 2011-06-28 LAB — CBC
Platelets: 218 10*3/uL (ref 150–400)
RBC: 4.5 MIL/uL (ref 3.87–5.11)
RDW: 13.3 % (ref 11.5–15.5)
WBC: 7.1 10*3/uL (ref 4.0–10.5)

## 2011-06-28 LAB — PROTIME-INR: INR: 0.97 (ref 0.00–1.49)

## 2011-06-28 LAB — APTT: aPTT: 27 seconds (ref 24–37)

## 2011-06-28 NOTE — Pre-Procedure Instructions (Signed)
20 Haley Kennedy  06/28/2011   Your procedure is scheduled on: Fri,Nov 9th at 10:45  Report to [facility] at 0845 AM.  Call this number if you have problems the morning of surgery: [phone]   Remember:   Do not eat food:After Midnight.  Do not drink clear liquids: 4 Hours before arrival.  Take these medicines the morning of surgery with A SIP OF WATER: Metoprolol   Do not wear jewelry, make-up or nail polish.  Do not wear lotions, powders, or perfumes. You may wear deodorant.  Do not shave 48 hours prior to surgery.  Do not bring valuables to the hospital.  Contacts, dentures or bridgework may not be worn into surgery.  Leave suitcase in the car. After surgery it may be brought to your room.  For patients admitted to the hospital, checkout time is 11:00 AM the day of discharge.   Patients discharged the day of surgery will not be allowed to drive home.  Name and phone number of your driver: spouse [phone]  Special Instructions: CHG Shower Use Special Wash: 1/2 bottle night before surgery and 1/2 bottle morning of surgery.   Please read over the following fact sheets that you were given: Pain Booklet, Coughing and Deep Breathing, MRSA Information and Surgical Site Infection Prevention

## 2011-06-28 NOTE — Pre-Procedure Instructions (Signed)
20 Haley Kennedy  06/28/2011   Your procedure is scheduled on: Fri,Nov 9th at 1045 Report to [facility] Stay [facility] at 0845  Call this number if you have problems the morning of surgery: [phone]   Remember:   Do not eat food:After Midnight.  Do not drink clear liquids: 4 Hours before arrival.  Take these medicines the morning of surgery with A SIP OF WATER: Metoprolol   Do not wear jewelry, make-up or nail polish.  Do not wear lotions, powders, or perfumes. You may wear deodorant.  Do not shave 48 hours prior to surgery.  Do not bring valuables to the hospital.  Contacts, dentures or bridgework may not be worn into surgery.  Leave suitcase in the car. After surgery it may be brought to your room.  For patients admitted to the hospital, checkout time is 11:00 AM the day of discharge.   Patients discharged the day of surgery will not be allowed to drive home.  Name and phone number of your driver: spouse [phone]  Special Instructions: CHG Shower Use Special Wash: 1/2 bottle night before surgery and 1/2 bottle morning of surgery.   Please read over the following fact sheets that you were given: Pain Booklet, Coughing and Deep Breathing, MRSA Information and Surgical Site Infection Prevention

## 2011-07-05 MED ORDER — POTASSIUM CHLORIDE IN NACL 20-0.9 MEQ/L-% IV SOLN
INTRAVENOUS | Status: DC
  Filled 2011-07-05: qty 1000

## 2011-07-05 MED ORDER — VANCOMYCIN HCL IN DEXTROSE 1-5 GM/200ML-% IV SOLN
1000.0000 mg | INTRAVENOUS | Status: DC
  Filled 2011-07-05: qty 200

## 2011-07-05 NOTE — H&P (Signed)
NAME:  Haley Kennedy, Haley Kennedy                 ACCOUNT NO.:  [id number]  MEDICAL RECORD NO.:  [id number]  LOCATION:                                 FACILITY:  PHYSICIAN:  Almedia Balls. Ranell Patrick, M.D. DATE OF BIRTH:  Sep 17, 1945  DATE OF ADMISSION: DATE OF DISCHARGE:                             HISTORY & PHYSICAL   CHIEF COMPLAINT:  Right shoulder pain.  HISTORY OF PRESENT ILLNESS:  The patient is a 65 year old female with worsening right shoulder pain status post a fall with a rotator cuff injury about 2 months ago.  The patient did notice on MRI to have rotator cuff tear.  She has been consulted in regards to surgical management for that.  The patient states she is still having some increased weakness and pains and elected to have surgery to decrease pain and increase function in right upper extremity.  PAST MEDICAL HISTORY:  Hyperlipidemia, hypertension, and anxiety.  The patient also has known lung nodule on plain chest x-ray but will need workup with chest CT while in the hospital.  FAMILY MEDICAL HISTORY:  Negative.  SOCIAL HISTORY:  Patient of Dr. Amador Cunas.  Does not smoke or use alcohol.  DRUG ALLERGIES:  CIPRO, PENICILLIN, STEROIDS, and "MULTIPLE PAIN MEDICINES."  CURRENT MEDICATIONS: 1. Metoprolol 50 mg 1-1/2 tab daily. 2. Lasix 40 mg 1-1/2 tab daily. 3. Over-the-counter vitamins.  REVIEW OF SYSTEMS:  She has pain on range of motion.  ACTIVITY:  Right upper extremity with moderate weakness.  The patient also uses a walker, wheelchair only for any type of motivation or movement.  PHYSICAL EXAMINATION:  VITAL SIGNS:  Pulse is 60, respirations 16, blood pressure 110/62. GENERAL:  The patient is healthy-appearing 65 year old female, in no acute distress.  Pleasant mood and affect.  Alert and oriented x3. HEAD AND NECK:  Cranial nerves II-XII grossly intact. CHEST:  Active breath sounds bilaterally.  No wheezes, rhonchi, or rales. HEART:  Regular rate and rhythm.  No  murmur. ABDOMEN:  Nontender, nondistended with active bowel sounds. EXTREMITIES:  Right shoulder shows moderate tenderness with range of motion and also weakness with external rotation 3.5 to 5/5. Neurovascularly, she is intact distally.  Capillary refill was 2 seconds. SKIN:  She has no rashes.  X-RAYS: 1. Right shoulder show (supraspinatus rotator cuff tear with some mild     retraction along with AC arthrosis). 2. Chest nodule would need further workup by chest CT.  IMPRESSION:  Right shoulder pain secondary to rotator cuff tear.  PLAN:  To have a rotator cuff repair by Dr. Malon Kindle and also workup of the lung nodule seen in preop x-rays.     Luisantonio Adinolfi B. Adrien Dietzman, P.A.   ______________________________ Almedia Balls. Ranell Patrick, M.D.    TBD/MEDQ  D:  07/04/2011  T:  07/04/2011  Job:  [id number]

## 2011-07-06 ENCOUNTER — Encounter (HOSPITAL_COMMUNITY): Payer: Self-pay | Admitting: *Deleted

## 2011-07-06 ENCOUNTER — Inpatient Hospital Stay (HOSPITAL_COMMUNITY): Payer: Medicare Other | Admitting: Certified Registered Nurse Anesthetist

## 2011-07-06 ENCOUNTER — Encounter (HOSPITAL_COMMUNITY)
Admission: RE | Disposition: A | Discharge status: Rehabilitation Facility | Payer: Self-pay | Source: Ambulatory Visit | Attending: Orthopedic Surgery

## 2011-07-06 ENCOUNTER — Inpatient Hospital Stay (HOSPITAL_COMMUNITY)
Admission: RE | Admit: 2011-07-06 | Discharge: 2011-07-11 | DRG: 512 | Disposition: A | Discharge status: Rehabilitation Facility | Payer: Medicare Other | Source: Ambulatory Visit | Attending: Orthopedic Surgery | Admitting: Orthopedic Surgery

## 2011-07-06 ENCOUNTER — Encounter (HOSPITAL_COMMUNITY): Payer: Self-pay | Admitting: Orthopedic Surgery

## 2011-07-06 ENCOUNTER — Encounter (HOSPITAL_COMMUNITY): Payer: Self-pay | Admitting: Certified Registered Nurse Anesthetist

## 2011-07-06 DIAGNOSIS — W19XXXA Unspecified fall, initial encounter: Secondary | ICD-10-CM | POA: Diagnosis present

## 2011-07-06 DIAGNOSIS — I10 Essential (primary) hypertension: Secondary | ICD-10-CM | POA: Diagnosis present

## 2011-07-06 DIAGNOSIS — R911 Solitary pulmonary nodule: Secondary | ICD-10-CM | POA: Diagnosis present

## 2011-07-06 DIAGNOSIS — E785 Hyperlipidemia, unspecified: Secondary | ICD-10-CM | POA: Diagnosis present

## 2011-07-06 DIAGNOSIS — F411 Generalized anxiety disorder: Secondary | ICD-10-CM | POA: Diagnosis present

## 2011-07-06 DIAGNOSIS — S43429A Sprain of unspecified rotator cuff capsule, initial encounter: Principal | ICD-10-CM | POA: Diagnosis present

## 2011-07-06 HISTORY — DX: Sprain of unspecified rotator cuff capsule, initial encounter: S43.429A

## 2011-07-06 SURGERY — SHOULDER ARTHROSCOPY WITH OPEN ROTATOR CUFF REPAIR AND DISTAL CLAVICLE ACROMINECTOMY
Anesthesia: General | Laterality: Right | Wound class: Clean

## 2011-07-06 MED ORDER — LIDOCAINE HCL 4 % IJ SOLN
INTRAMUSCULAR | Status: DC | PRN
  Administered 2011-07-06: 5 mL

## 2011-07-06 MED ORDER — NEOSTIGMINE METHYLSULFATE 1 MG/ML IJ SOLN
INTRAMUSCULAR | Status: DC | PRN
  Administered 2011-07-06: 2 mg via INTRAVENOUS

## 2011-07-06 MED ORDER — ONDANSETRON HCL 4 MG/2ML IJ SOLN: 4.0000 mg | Freq: Once | INTRAMUSCULAR | Status: DC | PRN

## 2011-07-06 MED ORDER — BUPIVACAINE-EPINEPHRINE PF 0.5-1:200000 % IJ SOLN
INTRAMUSCULAR | Status: DC | PRN
  Administered 2011-07-06: 25 mL

## 2011-07-06 MED ORDER — MENTHOL 3 MG MT LOZG: 1.0000 | LOZENGE | OROMUCOSAL | Status: DC | PRN

## 2011-07-06 MED ORDER — VANCOMYCIN HCL 1000 MG IV SOLR
1000.0000 mg | INTRAVENOUS | Status: DC | PRN
  Administered 2011-07-06: 1 g via INTRAVENOUS

## 2011-07-06 MED ORDER — BISACODYL 10 MG RE SUPP: 10.0000 mg | Freq: Every day | RECTAL | Status: DC | PRN

## 2011-07-06 MED ORDER — ONDANSETRON HCL 4 MG/2ML IJ SOLN
INTRAMUSCULAR | Status: DC | PRN
  Administered 2011-07-06: 4 mg via INTRAVENOUS

## 2011-07-06 MED ORDER — ACETAMINOPHEN 325 MG PO TABS
650.0000 mg | ORAL_TABLET | Freq: Four times a day (QID) | ORAL | Status: DC | PRN
  Administered 2011-07-09: 325 mg via ORAL
  Administered 2011-07-11: 650 mg via ORAL
  Filled 2011-07-06: qty 1
  Filled 2011-07-06: qty 2

## 2011-07-06 MED ORDER — MIDAZOLAM HCL 5 MG/5ML IJ SOLN
INTRAMUSCULAR | Status: DC | PRN
  Administered 2011-07-06: 2 mg via INTRAVENOUS

## 2011-07-06 MED ORDER — PHENOL 1.4 % MT LIQD
1.0000 | OROMUCOSAL | Status: DC | PRN
  Filled 2011-07-06: qty 177

## 2011-07-06 MED ORDER — METHOCARBAMOL 500 MG PO TABS
500.0000 mg | ORAL_TABLET | Freq: Four times a day (QID) | ORAL | Status: DC | PRN
  Administered 2011-07-06 – 2011-07-08 (×2): 500 mg via ORAL
  Filled 2011-07-06 (×2): qty 1

## 2011-07-06 MED ORDER — GRAPE SEED 60 MG PO CAPS: 1.0000 | ORAL_CAPSULE | Freq: Every day | ORAL | Status: DC

## 2011-07-06 MED ORDER — ENOXAPARIN SODIUM 40 MG/0.4ML ~~LOC~~ SOLN
40.0000 mg | SUBCUTANEOUS | Status: DC
  Administered 2011-07-07 – 2011-07-11 (×4): 40 mg via SUBCUTANEOUS
  Filled 2011-07-06 (×5): qty 0.4

## 2011-07-06 MED ORDER — ONDANSETRON HCL 4 MG PO TABS: 4.0000 mg | ORAL_TABLET | Freq: Four times a day (QID) | ORAL | Status: DC | PRN

## 2011-07-06 MED ORDER — LACTATED RINGERS IV SOLN
INTRAVENOUS | Status: DC | PRN
  Administered 2011-07-06 (×2): via INTRAVENOUS

## 2011-07-06 MED ORDER — POLYETHYLENE GLYCOL 3350 17 G PO PACK
17.0000 g | PACK | Freq: Every day | ORAL | Status: DC | PRN
  Filled 2011-07-06: qty 1

## 2011-07-06 MED ORDER — ACETAMINOPHEN 650 MG RE SUPP: 650.0000 mg | Freq: Four times a day (QID) | RECTAL | Status: DC | PRN

## 2011-07-06 MED ORDER — VANCOMYCIN HCL IN DEXTROSE 1-5 GM/200ML-% IV SOLN
1000.0000 mg | Freq: Once | INTRAVENOUS | Status: AC
  Administered 2011-07-07: 1000 mg via INTRAVENOUS
  Filled 2011-07-06: qty 200

## 2011-07-06 MED ORDER — SODIUM CHLORIDE 0.9 % IR SOLN
Status: DC | PRN
  Administered 2011-07-06: 3000 mL

## 2011-07-06 MED ORDER — ROCURONIUM BROMIDE 100 MG/10ML IV SOLN
INTRAVENOUS | Status: DC | PRN
  Administered 2011-07-06: 30 mg via INTRAVENOUS

## 2011-07-06 MED ORDER — MORPHINE SULFATE 2 MG/ML IJ SOLN: 0.0500 mg/kg | INTRAMUSCULAR | Status: DC | PRN

## 2011-07-06 MED ORDER — KETOROLAC TROMETHAMINE 15 MG/ML IJ SOLN
INTRAMUSCULAR | Status: DC | PRN
  Administered 2011-07-06: 15 mg via INTRAVENOUS

## 2011-07-06 MED ORDER — FLEET ENEMA 7-19 GM/118ML RE ENEM: 1.0000 | ENEMA | Freq: Every day | RECTAL | Status: DC | PRN

## 2011-07-06 MED ORDER — MEPERIDINE HCL 25 MG/ML IJ SOLN: 6.2500 mg | INTRAMUSCULAR | Status: DC | PRN

## 2011-07-06 MED ORDER — HYDROMORPHONE HCL PF 1 MG/ML IJ SOLN: 0.2500 mg | INTRAMUSCULAR | Status: DC | PRN

## 2011-07-06 MED ORDER — HYDROCODONE-ACETAMINOPHEN 5-325 MG PO TABS
1.0000 | ORAL_TABLET | ORAL | Status: DC | PRN
  Administered 2011-07-06: 2 via ORAL
  Administered 2011-07-08 – 2011-07-10 (×5): 1 via ORAL
  Filled 2011-07-06: qty 1
  Filled 2011-07-06: qty 2
  Filled 2011-07-06: qty 1
  Filled 2011-07-06: qty 2
  Filled 2011-07-06 (×2): qty 1
  Filled 2011-07-06: qty 2

## 2011-07-06 MED ORDER — ONDANSETRON HCL 4 MG/2ML IJ SOLN
4.0000 mg | Freq: Four times a day (QID) | INTRAMUSCULAR | Status: DC | PRN
  Administered 2011-07-06 – 2011-07-07 (×2): 4 mg via INTRAVENOUS
  Filled 2011-07-06 (×2): qty 2

## 2011-07-06 MED ORDER — METOPROLOL SUCCINATE ER 25 MG PO TB24
25.0000 mg | ORAL_TABLET | Freq: Every day | ORAL | Status: DC
  Administered 2011-07-07 – 2011-07-11 (×5): 25 mg via ORAL
  Filled 2011-07-06 (×5): qty 1

## 2011-07-06 MED ORDER — EPHEDRINE SULFATE 50 MG/ML IJ SOLN
INTRAMUSCULAR | Status: DC | PRN
  Administered 2011-07-06 (×2): 10 mg via INTRAVENOUS
  Administered 2011-07-06: 5 mg via INTRAVENOUS
  Administered 2011-07-06 (×3): 10 mg via INTRAVENOUS
  Administered 2011-07-06: 5 mg via INTRAVENOUS

## 2011-07-06 MED ORDER — CEFAZOLIN SODIUM-DEXTROSE 2-3 GM-% IV SOLR: 2.0000 g | INTRAVENOUS | Status: DC

## 2011-07-06 MED ORDER — DOCUSATE SODIUM 100 MG PO CAPS
100.0000 mg | ORAL_CAPSULE | Freq: Two times a day (BID) | ORAL | Status: DC
  Administered 2011-07-06 – 2011-07-11 (×8): 100 mg via ORAL
  Filled 2011-07-06 (×11): qty 1

## 2011-07-06 MED ORDER — METOCLOPRAMIDE HCL 10 MG PO TABS
5.0000 mg | ORAL_TABLET | Freq: Three times a day (TID) | ORAL | Status: DC | PRN
  Administered 2011-07-07 – 2011-07-08 (×2): 10 mg via ORAL
  Filled 2011-07-06 (×2): qty 1

## 2011-07-06 MED ORDER — PROPOFOL 10 MG/ML IV EMUL
INTRAVENOUS | Status: DC | PRN
  Administered 2011-07-06: 200 mg via INTRAVENOUS

## 2011-07-06 MED ORDER — METOCLOPRAMIDE HCL 5 MG/ML IJ SOLN
5.0000 mg | Freq: Three times a day (TID) | INTRAMUSCULAR | Status: DC | PRN
  Filled 2011-07-06: qty 2

## 2011-07-06 MED ORDER — MAGNESIUM HYDROXIDE 400 MG/5ML PO SUSP: 30.0000 mL | Freq: Two times a day (BID) | ORAL | Status: DC | PRN

## 2011-07-06 MED ORDER — GLYCOPYRROLATE 0.2 MG/ML IJ SOLN
INTRAMUSCULAR | Status: DC | PRN
  Administered 2011-07-06: .2 mg via INTRAVENOUS
  Administered 2011-07-06: .4 mg via INTRAVENOUS

## 2011-07-06 MED ORDER — HYDROMORPHONE HCL PF 1 MG/ML IJ SOLN
0.5000 mg | INTRAMUSCULAR | Status: DC | PRN
  Administered 2011-07-06 – 2011-07-07 (×4): 1 mg via INTRAVENOUS
  Filled 2011-07-06 (×4): qty 1

## 2011-07-06 MED ORDER — METHOCARBAMOL 100 MG/ML IJ SOLN
500.0000 mg | Freq: Four times a day (QID) | INTRAVENOUS | Status: DC | PRN
  Filled 2011-07-06: qty 5

## 2011-07-06 MED ORDER — FENTANYL CITRATE 0.05 MG/ML IJ SOLN
INTRAMUSCULAR | Status: DC | PRN
  Administered 2011-07-06 (×3): 50 ug via INTRAVENOUS

## 2011-07-06 MED ORDER — BUPIVACAINE-EPINEPHRINE 0.25% -1:200000 IJ SOLN
INTRAMUSCULAR | Status: DC | PRN
  Administered 2011-07-06: 8 mL

## 2011-07-06 MED ORDER — BISACODYL 5 MG PO TBEC
10.0000 mg | DELAYED_RELEASE_TABLET | Freq: Every day | ORAL | Status: DC | PRN
  Administered 2011-07-08: 10 mg via ORAL
  Administered 2011-07-10: 5 mg via ORAL
  Filled 2011-07-06: qty 2
  Filled 2011-07-06: qty 1

## 2011-07-06 MED ORDER — FUROSEMIDE 20 MG PO TABS
20.0000 mg | ORAL_TABLET | Freq: Every day | ORAL | Status: DC
  Administered 2011-07-07 – 2011-07-08 (×2): 20 mg via ORAL
  Filled 2011-07-06 (×6): qty 1

## 2011-07-06 SURGICAL SUPPLY — 78 items
ANCH SUT 1.4 1 LD SFT TIS (Anchor) ×1 IMPLANT
ANCH SUT 2 2.9 2 LD BLU (Anchor) ×1 IMPLANT
ANCHOR JUGGERKNOT 2.9 (Anchor) ×1 IMPLANT
ANCHOR JUGGERKNOT SZ1 (Anchor) ×1 IMPLANT
BLADE LONG MED 31X9 (MISCELLANEOUS) IMPLANT
BLADE SURG 11 STRL SS (BLADE) ×2 IMPLANT
BUR OVAL 4.0 (BURR) ×1 IMPLANT
CLOSURE STERI STRIP 1/2 X4 (GAUZE/BANDAGES/DRESSINGS) ×1 IMPLANT
CLOTH BEACON ORANGE TIMEOUT ST (SAFETY) ×2 IMPLANT
COVER SURGICAL LIGHT HANDLE (MISCELLANEOUS) ×2 IMPLANT
CrossFT -BC suture anchor w/ 2 (Anchor) ×1 IMPLANT
DRAPE INCISE IOBAN 66X45 STRL (DRAPES) ×2 IMPLANT
DRAPE STERI 35X30 U-POUCH (DRAPES) ×2 IMPLANT
DRAPE U-SHAPE 47X51 STRL (DRAPES) ×2 IMPLANT
DRILL BIT 5/64 (BIT) ×2 IMPLANT
DRSG EMULSION OIL 3X3 NADH (GAUZE/BANDAGES/DRESSINGS) ×3 IMPLANT
DRSG PAD ABDOMINAL 8X10 ST (GAUZE/BANDAGES/DRESSINGS) ×3 IMPLANT
DURAPREP 26ML APPLICATOR (WOUND CARE) ×2 IMPLANT
ELECT NDL TIP 2.8 STRL (NEEDLE) ×1 IMPLANT
ELECT NEEDLE TIP 2.8 STRL (NEEDLE) ×2 IMPLANT
ELECT REM PT RETURN 9FT ADLT (ELECTROSURGICAL)
ELECTRODE REM PT RTRN 9FT ADLT (ELECTROSURGICAL) IMPLANT
GAUZE SPONGE 4X4 12PLY STRL LF (GAUZE/BANDAGES/DRESSINGS) ×1 IMPLANT
GLOVE BIOGEL PI IND STRL 8.5 (GLOVE) IMPLANT
GLOVE BIOGEL PI INDICATOR 8.5 (GLOVE) ×1
GLOVE BIOGEL PI ORTHO PRO 7.5 (GLOVE) ×1
GLOVE BIOGEL PI ORTHO PRO SZ7 (GLOVE) ×1
GLOVE BIOGEL PI ORTHO PRO SZ8 (GLOVE) ×1
GLOVE ECLIPSE 8.5 STRL (GLOVE) ×1 IMPLANT
GLOVE ORTHO TXT STRL SZ7.5 (GLOVE) ×2 IMPLANT
GLOVE PI ORTHO PRO STRL 7.5 (GLOVE) ×1 IMPLANT
GLOVE PI ORTHO PRO STRL SZ7 (GLOVE) IMPLANT
GLOVE PI ORTHO PRO STRL SZ8 (GLOVE) ×1 IMPLANT
GLOVE SURG ORTHO 8.5 STRL (GLOVE) ×2 IMPLANT
GLOVE SURG SS PI 7.0 STRL IVOR (GLOVE) ×1 IMPLANT
GOWN STRL NON-REIN LRG LVL3 (GOWN DISPOSABLE) ×4 IMPLANT
KIT BASIN OR (CUSTOM PROCEDURE TRAY) ×2 IMPLANT
KIT JUGGERKNOT DISP 2.9MM (KITS) ×1 IMPLANT
KIT ROOM TURNOVER OR (KITS) ×2 IMPLANT
MANIFOLD NEPTUNE II (INSTRUMENTS) ×2 IMPLANT
NDL HYPO 25GX1X1/2 BEV (NEEDLE) ×1 IMPLANT
NDL SPNL 18GX3.5 QUINCKE PK (NEEDLE) ×1 IMPLANT
NDL SUT 6 .5 CRC .975X.05 MAYO (NEEDLE) IMPLANT
NEEDLE HYPO 25GX1X1/2 BEV (NEEDLE) ×2 IMPLANT
NEEDLE MAYO TAPER (NEEDLE) ×2
NEEDLE SPNL 18GX3.5 QUINCKE PK (NEEDLE) ×2 IMPLANT
NS IRRIG 1000ML POUR BTL (IV SOLUTION) ×2 IMPLANT
PACK SHOULDER (CUSTOM PROCEDURE TRAY) ×2 IMPLANT
PAD ARMBOARD 7.5X6 YLW CONV (MISCELLANEOUS) ×2 IMPLANT
PENCIL BUTTON HOLSTER BLD 10FT (ELECTRODE) ×1 IMPLANT
RESECTOR FULL RADIUS 4.2MM (BLADE) ×1 IMPLANT
SET ARTHROSCOPY TUBING (MISCELLANEOUS) ×2
SET ARTHROSCOPY TUBING LN (MISCELLANEOUS) ×1 IMPLANT
SET JUGGERKNOT DISP 1.4MM ×1 IMPLANT
SLEEVE SURGEON STRL (DRAPES) ×1 IMPLANT
SLING ARM FOAM STRAP LRG (SOFTGOODS) ×2 IMPLANT
SLING ARM FOAM STRAP MED (SOFTGOODS) IMPLANT
SPONGE GAUZE 4X4 12PLY (GAUZE/BANDAGES/DRESSINGS) ×2 IMPLANT
SPONGE LAP 4X18 X RAY DECT (DISPOSABLE) ×2 IMPLANT
STRIP CLOSURE SKIN 1/2X4 (GAUZE/BANDAGES/DRESSINGS) ×3 IMPLANT
SUCTION FRAZIER TIP 10 FR DISP (SUCTIONS) ×2 IMPLANT
SUT BONE WAX W31G (SUTURE) ×1 IMPLANT
SUT FIBERWIRE #2 38 T-5 BLUE (SUTURE) ×4
SUT MNCRL AB 4-0 PS2 18 (SUTURE) ×2 IMPLANT
SUT VIC AB 0 CT1 27 (SUTURE) ×2
SUT VIC AB 0 CT1 27XBRD ANBCTR (SUTURE) IMPLANT
SUT VIC AB 0 CT2 27 (SUTURE) ×1 IMPLANT
SUT VIC AB 2-0 CT1 27 (SUTURE) ×2
SUT VIC AB 2-0 CT1 TAPERPNT 27 (SUTURE) IMPLANT
SUT VICRYL 0 CT 1 36IN (SUTURE) ×6 IMPLANT
SUTURE FIBERWR #2 38 T-5 BLUE (SUTURE) IMPLANT
SYR CONTROL 10ML LL (SYRINGE) ×2 IMPLANT
TOWEL OR 17X24 6PK STRL BLUE (TOWEL DISPOSABLE) ×2 IMPLANT
TOWEL OR 17X26 10 PK STRL BLUE (TOWEL DISPOSABLE) ×2 IMPLANT
TUBE CONNECTING 12X1/4 (SUCTIONS) ×1 IMPLANT
WAND 90 DEG TURBOVAC W/CORD (SURGICAL WAND) ×2 IMPLANT
WATER STERILE IRR 1000ML POUR (IV SOLUTION) ×2 IMPLANT
juggerknot disposable kit ×1 IMPLANT

## 2011-07-06 NOTE — Anesthesia Preprocedure Evaluation (Addendum)
Anesthesia Evaluation  Patient identified by MRN, date of birth, ID band Patient awake    Reviewed: Allergy & Precautions, H&P , NPO status , Patient's Chart, lab work & pertinent test results, reviewed documented beta blocker date and time   Airway Mallampati: I TM Distance: >3 FB Neck ROM: Full    Dental  (+) Teeth Intact, Caps and Dental Advisory Given   Pulmonary shortness of breath, asthma ,  Asthma as a child no recent problems as an adult   Pulmonary exam normal       Cardiovascular neg cardio ROS     Neuro/Psych Charcot Marie Tooth Disorder  Neuromuscular disease    GI/Hepatic PUD,   Endo/Other  Negative Endocrine ROS  Renal/GU      Musculoskeletal   Abdominal   Peds  Hematology   Anesthesia Other Findings   Reproductive/Obstetrics                         Anesthesia Physical Anesthesia Plan  ASA: III  Anesthesia Plan: General   Post-op Pain Management:    Induction: Intravenous  Airway Management Planned: Oral ETT  Additional Equipment:   Intra-op Plan:   Post-operative Plan: Extubation in OR  Informed Consent: I have reviewed the patients History and Physical, chart, labs and discussed the procedure including the risks, benefits and alternatives for the proposed anesthesia with the patient or authorized representative who has indicated his/her understanding and acceptance.   Dental advisory given  Plan Discussed with: CRNA and Surgeon  Anesthesia Plan Comments:         Anesthesia Quick Evaluation

## 2011-07-06 NOTE — Anesthesia Postprocedure Evaluation (Signed)
  Anesthesia Post-op Note  Patient: Haley Kennedy  Procedure(s) Performed:  SHOULDER ARTHROSCOPY WITH OPEN ROTATOR CUFF REPAIR AND DISTAL CLAVICLE ACROMINECTOMY - ARTHROSCOPY RIGHT SHOULDER WITH SUBACROMINAL DECOMPRESSION AND MINI OPEN ROTATOR CUFF REPAIR/OPEN DISTAL CLAVICAL RESECTION ; SHOULDER ARTHROSCOPY WITH DISTAL CLAVICLE RESECTION  Patient Location: PACU  Anesthesia Type: GA combined with regional for post-op pain  Level of Consciousness: awake, alert  and oriented  Airway and Oxygen Therapy: Patient Spontanous Breathing  Post-op Pain: none  Post-op Assessment: Post-op Vital signs reviewed, Patient's Cardiovascular Status Stable, Respiratory Function Stable, Patent Airway and No signs of Nausea or vomiting  Post-op Vital Signs: Reviewed and stable  Complications: No apparent anesthesia complications

## 2011-07-06 NOTE — Preoperative (Signed)
Beta Blockers   Reason not to administer Beta Blockers:Not Applicable 

## 2011-07-06 NOTE — Interval H&P Note (Signed)
History and Physical Interval Note:   07/06/2011   7:47 AM   Haley Kennedy  has presented today for surgery, with the diagnosis of RT SHOULDER ROTATOR CUFF TEAR  The various methods of treatment have been discussed with the patient and family. After consideration of risks, benefits and other options for treatment, the patient has consented to  Procedure(s): SHOULDER ARTHROSCOPY WITH OPEN ROTATOR CUFF REPAIR AND DISTAL CLAVICLE ACROMINECTOMY SHOULDER ARTHROSCOPY WITH DISTAL CLAVICLE RESECTION as a surgical intervention .  The patients' history has been reviewed, patient examined, no change in status, stable for surgery.  I have reviewed the patients' chart and labs.  Questions were answered to the patient's satisfaction.     Verlee Rossetti  MD

## 2011-07-06 NOTE — Anesthesia Procedure Notes (Addendum)
Anesthesia Regional Block:  Interscalene brachial plexus blockInterscalene brachial plexus block Narrative:    Anesthesia Regional Block:   Narrative:    Procedure Name: Intubation Performed by: Delbert Harness Pre-anesthesia Checklist: Patient identified, Emergency Drugs available, Suction available and Patient being monitored Patient Re-evaluated:Patient Re-evaluated prior to inductionOxygen Delivery Method: Circle System Utilized Preoxygenation: Pre-oxygenation with 100% oxygen Intubation Type: IV induction Ventilation: Mask ventilation without difficulty Laryngoscope Size: Mac and 3 Grade View: Grade I Tube type: Oral Number of attempts: 1 Placement Confirmation: ETT inserted through vocal cords under direct vision,  breath sounds checked- equal and bilateral and positive ETCO2 Secured at: 22 cm Tube secured with: Tape Dental Injury: Teeth and Oropharynx as per pre-operative assessment  Comments: LTA utilized    Anesthesia Regional Block:  Interscalene brachial plexus block  Pre-Anesthetic Checklist: ,, timeout performed, Correct Patient, Correct Site, Correct Laterality, Correct Procedure, Correct Position, site marked, Risks and benefits discussed,  Surgical consent,  Pre-op evaluation,  At surgeon's request and post-op pain management  Laterality: Right  Prep: chloraprep       Needles:  Injection technique: Single-shot  Needle Type: Echogenic Needle     Needle Length: 5cm 5 cm Needle Gauge: 22 and 22 G    Additional Needles:  Procedures: ultrasound guided  Motor weakness within 3 minutes. Interscalene brachial plexus block Narrative:  Start time: 07/06/2011 7:15 AM End time: 07/06/2011 7:30 AM Injection made incrementally with aspirations every 5 mL.  Performed by: Personally  Anesthesiologist: Sheldon Silvan  Additional Notes: 25 ml of Marcaine 0.5% w/1:200000 EPI used for injection.

## 2011-07-06 NOTE — Op Note (Signed)
NAME:  Haley Kennedy, Haley Kennedy                 ACCOUNT NO.:  [id number]  MEDICAL RECORD NO.:  [id number]  LOCATION:  [facility]                         FACILITY:  [facility]  PHYSICIAN:  Almedia Balls. Ranell Patrick, M.D. DATE OF BIRTH:  01/26/1946  DATE OF PROCEDURE:  07/06/2011 DATE OF DISCHARGE:                              OPERATIVE REPORT   PREOPERATIVE DIAGNOSIS:  Right shoulder rotator cuff tear as well as acromioclavicular joint arthritis.  POSTOPERATIVE DIAGNOSES: 1. Right shoulder rotator cuff tear. 2. Right shoulder superior labral anterior posterior lesion. 3. Right shoulder acromioclavicular joint arthritis, symptomatic.  PROCEDURE PERFORMED:  Right shoulder arthroscopy with extensive intra- articular debridement including debridement of torn superior labrum anterior-posterior, arthroscopic biceps tenotomy, arthroscopic subacromial decompression followed by open distal clavicle resection and mini open rotator cuff repair and biceps tenodesis in the groove.  ATTENDING SURGEON:  Almedia Balls. Ranell Patrick, MD  ASSISTANT:  Donnie Coffin. Dixon, PA-C who scrubbed in entirety of the procedure and necessary for appropriate completion of procedure including visualization, appropriate shoulder positioning for adequate repair.  ANESTHESIA:  General plus interscalene block anesthesia was used.  ESTIMATED BLOOD LOSS:  Minimal.  FLUID REPLACEMENT:  1200 mL of crystalloids.  INSTRUMENT COUNT:  Correct.  COMPLICATIONS:  No complications.  Perioperative antibiotics were given.  INDICATIONS:  The patient is a 65 year old female with worsening right shoulder pain secondary to a torn rotator cuff and advanced AC arthropathy.  The patient has failed all measures of conservative management and presents for operative treatment to restore function and limit pain to her shoulder.  Informed consent was obtained.  DESCRIPTION OF PROCEDURE:  After adequate level of anesthesia was achieved, the patient was positioned in a  modified beach-chair position. Right shoulder was sterilely prepped and draped in usual manner.  Time- out was called.  We then initiated surgery with arthroscopic procedure. We used standard arthroscopic portals including anterior, posterior, and lateral portals created in usual fashion.  Identified a torn superior labrum anterior-posterior with an unstable biceps anchor.  We performed results of tearing of the biceps tendon intra-articularly and performed a biceps tenotomy and labral debridement back to a stable labral rim. We then identified a large retracted rotator cuff tear near the level of the glenoid labrum.  The subscapularis appeared intact anterior and inferiorly.  Good articular cartilage noted and posterior superiorly intact labrum.  The teres minor appeared intact as well posteriorly. Following completion of the intra-articular portion of the procedure, placed scope in subacromial space.  Thorough bursectomy and acromioplasty performed crying type 1 acromial shape with a butcher block technique using a high-speed bur.  We did not release the CA ligament for fear of creating a situation where she could have anterior superior escape, but we did have a nice decompression all the way over to the [facility] joint where there was a large spur at that joint.  At this point, we also identified that large rotator cuff tear again fairly far medially retracted.  At this point, we concluded the arthroscopic portion of surgery and then made a Saber incision overlying the [facility] joint.  Dissection down to subcutaneous tissues using the Bovie.  We identified the deltotrapezial fascia, divided that in  line with distal clavicle.  We excised the distal 3 mm of distal clavicle using an oscillating saw.  We thoroughly irrigated the AC interval.  We applied bone wax to cut into the clavicle.  We omitted the spurs off the dorsal aspect of the acromion at the joint margin and then we went ahead and repaired  deltotrapezial fascia with 0-Vicryl suture followed by 2-0 Vicryl subcutaneous closure and 4-0 Monocryl for skin.  We then made a mini open incision starting at the anterolateral border of the acromion staying distally about 3-4 cm.  Dissection down through subcutaneous tissues using needle-tipped Bovie.  Identified the deltoid raphe between the anterior and lateral heads of the deltoid.  We divided that raphe using the needle-tipped Bovie, placed our Arthrex retractor.  We then identified the bicipital groove.  We divided the tissue over the top of the bicipital groove and then delivered the biceps tendon out of the wound, which was extensively torn, we whip stitched that with #2 hi-fi suture by ConMed to reinforce the tendon and placed a single Biomet juggernaut suture anchor through the floor of the bicipital groove bringing that suture up through the reinforced portion of tendon to tenodese the tendon.  We then approached the rotator cuff tear.  This was a large retracted tear that we felt that we could only treat with margin convergence.  We were able to mobilize posteriorly and anteriorly using a Cobb elevator, and then using hi-fi suture #2 nonabsorbable, we were able to do a margin convergence medial to lateral placing a single 4.5 bio composite anchor by ConMed towards the posterior aspect of the repair and then a single large juggernaut suture anchored that more anteriorly.  We were able to get a good repair back to the footprint and not under tension.  We had reinforced with front to back sutures of #2 hi-fi.  She is going to be placed in abducted position and kept in that position during her rehab and I think she should do extremely well.  We are pleased we were able to get that repair as far retracted as well as the tendon quality looked decent.  We then checked to make sure there is no impingement.  There was none.  After thorough irrigation, subdeltoid interval repair of the  deltoid to itself with 0-Vicryl suture followed by 2-0 Vicryl subcutaneous closure, 4-0 Monocryl for skin.  Steri-Strips applied followed by a sterile dressing.  The patient tolerated the surgery well.     Almedia Balls. Ranell Patrick, M.D.     SRN/MEDQ  D:  07/06/2011  T:  07/06/2011  Job:  [id number]

## 2011-07-06 NOTE — Transfer of Care (Signed)
Immediate Anesthesia Transfer of Care Note  Patient: Haley Kennedy  Procedure(s) Performed:  SHOULDER ARTHROSCOPY WITH OPEN ROTATOR CUFF REPAIR AND DISTAL CLAVICLE ACROMINECTOMY - ARTHROSCOPY RIGHT SHOULDER WITH SUBACROMINAL DECOMPRESSION AND MINI OPEN ROTATOR CUFF REPAIR/OPEN DISTAL CLAVICAL RESECTION ; SHOULDER ARTHROSCOPY WITH DISTAL CLAVICLE RESECTION  Patient Location: PACU  Anesthesia Type: General  Level of Consciousness: awake, alert  and oriented  Airway & Oxygen Therapy: Patient Spontanous Breathing and Patient connected to nasal cannula oxygen  Post-op Assessment: Report given to PACU RN and Post -op Vital signs reviewed and stable  Post vital signs: Reviewed and stable  Complications: No apparent anesthesia complications

## 2011-07-06 NOTE — Brief Op Note (Signed)
07/06/2011  10:25 AM  PATIENT:  Haley Kennedy  65 y.o. female  PRE-OPERATIVE DIAGNOSIS:  RT SHOULDER ROTATOR CUFF TEAR, SLAP lesion, AC joint arthritis  POST-OPERATIVE DIAGNOSIS:  RT SHOULDER ROTATOR CUFF TEAR,SLAP lesion, AC joint arthritis  PROCEDURE:  Procedure(s): SHOULDER ARTHROSCOPY WITH EXTENSIVE INTRA-ARTICULAR DEBRIDEMENT OF TORN SUPERIOR LABRUM, ANTERIOR TO POSTERIOR, A-SAD, MINI-OPEN ROTATOR CUFF REPAIR AND BICEPS TENODESIS,  DISTAL CLAVICLE RESECTION  SURGEON:  Surgeon(s): Verlee Rossetti  PHYSICIAN ASSISTANT:   ASSISTANTS: Donnie Coffin. Dixon, PA-C   ANESTHESIA:   regional and general  EBL:  Total I/O In: 1500 [I.V.:1300; IV Piggyback:200] Out: 20 [Blood:20]  BLOOD ADMINISTERED:none  DRAINS: none   LOCAL MEDICATIONS USED:  MARCAINE 10 CC  SPECIMEN:  No Specimen  DISPOSITION OF SPECIMEN:  N/A  COUNTS:  YES  TOURNIQUET:  * No tourniquets in log *  DICTATION: .Other Dictation: Dictation Number   PLAN OF CARE: Admit to inpatient   PATIENT DISPOSITION:  PACU - hemodynamically stable.   Delay start of Pharmacological VTE agent (>24hrs) due to surgical blood loss or risk of bleeding:  {YES/NO/NOT APPLICABLE:20182

## 2011-07-07 ENCOUNTER — Inpatient Hospital Stay (HOSPITAL_COMMUNITY): Payer: Medicare Other

## 2011-07-07 MED ORDER — HYDROMORPHONE HCL 2 MG PO TABS
2.0000 mg | ORAL_TABLET | ORAL | Status: DC | PRN
  Administered 2011-07-07 – 2011-07-08 (×3): 2 mg via ORAL
  Filled 2011-07-07 (×3): qty 1

## 2011-07-07 NOTE — Progress Notes (Signed)
Occupational Therapy Evaluation Patient Details Name: Haley Kennedy MRN: [id number] DOB: 03/30/1946 Today's Date: 07/07/2011  Problem List: There is no problem list on file for this patient.   Past Medical History:  Past Medical History  Diagnosis Date  . Ulcer   . Hyperlipidemia   . 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 Hyperlipidemia   . Shortness of breath     with exertion;pt states its bc shes not in shape   . Asthma     as a child  . Peripheral neuropathy   . Swelling     from knee down;takes furosemide daily  . Chronic back pain   . Arthritis     low back and neck  . Gastric ulcer   . Diverticulosis    Past Surgical History:  Past Surgical History  Procedure Date  . Tonsillectomy   . Knee surgery   . Abdominal hysterectomy     8yrs ago  . Cholecystectomy     1997  . Foot surgery     1963  . Back surgery     after back surgery had to have iron infusion  . Back surgery     2009    OT Assessment/Plan/Recommendation OT Assessment Clinical Impression Statement: This 65 y.o. female presents to OT s/p rotator cuff repair and h/o multiple medical problems including Charcot-Marie Tooth, s/p multiple back surgeries.  Pt. demonstrates a significant decline in functional mobility and independence with BADLs dut to inabliity to use Rt. UE which she used to compensate for other areas of weakness.  Pt. is very motivated, has very good family support, and has aquired most DME to assist her during this time of recovery.   However, pt. now unable to perform basic transfers, and is max-total A with all BADLS.  Recommend OT to maximize safety and indepdence with ADLs, and recommend CIR consult to allow pt. to achieve mod A - min A level to allow her to return home with family  OT Recommendation/Assessment: Patient will need skilled OT in the acute care venue OT Problem List: Decreased strength;Impaired balance (sitting and/or standing);Decreased knowledge of  precautions;Impaired UE functional use;Pain Barriers to Discharge: None OT Therapy Diagnosis : Generalized weakness;Acute pain OT Plan OT Frequency: Min 2X/week OT Treatment/Interventions: Self-care/ADL training;Therapeutic exercise;DME and/or AE instruction;Therapeutic activities;Patient/family education;Balance training OT Recommendation Recommendations for Other Services: Rehab consult;PT consult Follow Up Recommendations: Inpatient Rehab Equipment Recommended: Defer to next venue Individuals Consulted Consulted and Agree with Results and Recommendations: Patient OT Goals Acute Rehab OT Goals OT Goal Formulation: With patient Time For Goal Achievement: 7 days ADL Goals Pt Will Perform Upper Body Bathing: with mod assist;Sitting, edge of bed (with assist from family and abiding by prec. without cues) Pt Will Perform Lower Body Bathing: with mod assist;Sit to stand from bed Pt Will Perform Upper Body Dressing: with max assist;Sitting, bed;Unsupported (Family will safely assist) Pt Will Perform Lower Body Dressing: with max assist;Sit to stand from bed (with family safely assisting) Pt Will Transfer to Toilet: with mod assist;3-in-1;Stand pivot transfer Pt Will Perform Toileting - Clothing Manipulation: with mod assist;with caregiver independent in assisting;Standing Pt Will Perform Toileting - Hygiene: with caregiver independent in assisting;with mod assist;Sit to stand from 3-in-1/toilet Additional ADL Goal #1: Pt/family will be independent with all precautions, and sling wear and care. Arm Goals Pt Will Perform AROM: with minimal assist (from family.  IR/ER with dowel, AROM elbow, wist, hand)  OT Evaluation  Precautions/Restrictions  Precautions Precautions: Shoulder Type of Shoulder Precautions: maintain Rt. shoulder in abducted position Precaution Comments: Upon therapist's enterance, pt. with sling on, but waist strap not fastened, and abduction pillow not in correct place  subsequently allowing Rt. UE to rest against her chest in adduction.  Sling and UE were repositioned correctly,  RN notified of correct position for sling. Required Braces or Orthoses: Yes Other Brace/Splint: Irena Cords sling Prior Functioning Home Living Lives With: Spouse Receives Help From: Family Type of Home: House Home Layout: One level Home Access: Ramped entrance Bathroom Shower/Tub: Walk-in shower (barrier free shower, accessible to w/c) Bathroom Toilet: Handicapped height Bathroom Accessibility: Yes How Accessible: Accessible via wheelchair Home Adaptive Equipment: Wheelchair - manual;Other (comment);Walker - rolling (lift chair) Prior Function Level of Independence: Needs assistance with ADLs;Needs assistance with homemaking Bath: Moderate (Mod I prior to fall 9/12) Toileting: Minimal (modified I prior to fall 9/12) Dressing: Moderate (Modified I prior to fall 9/12) Meal Prep: Maximal (Modified I prior to fall 9/12) Light Housekeeping: Maximal (Modified I prior to fall 9/12) Driving: Yes (prior to fall 9/12) Vocation: Retired Comments: Pt. has an Mining engineer w/c with elevating seat on order, and will be getting a loaner chair next week, to aid in mobility due to inability to use RW;  She plans to acquire a hospital bed, and shower seat.  Pt reports she was  modified independent prior to fall  9/12 when she injursed her shoulder.  Pt.'s husband is very supportive and able to assist at D/C  as is pt''s dtr.  Pt. Has bil. AFOs  ADL ADL Eating/Feeding: Set up Where Assessed - Eating/Feeding: Bed level Grooming: Teeth care;Brushing hair;Minimal assistance Where Assessed - Grooming: Sitting, bed;Unsupported Upper Body Bathing: Simulated;Maximal assistance Where Assessed - Upper Body Bathing: Sitting, bed;Unsupported Lower Body Bathing: Maximal assistance Lower Body Bathing Details (indicate cue type and reason): Pt. best able to access feet in supine or longsitting Where Assessed -  Lower Body Bathing: Supine, head of bed up;Sit to stand from bed Upper Body Dressing: Maximal assistance Where Assessed - Upper Body Dressing: Sitting, bed;Unsupported Lower Body Dressing: +1 Total assistance;Performed;Simulated Lower Body Dressing Details (indicate cue type and reason): Total assist to don bil. AFOs, socks, and shoes Where Assessed - Lower Body Dressing: Sit to stand from bed Toilet Transfer: Performed (Attempted, unable to perform safely) Toilet Transfer Equipment: Bedside commode Toileting - Clothing Manipulation: Maximal assistance Where Assessed - Toileting Clothing Manipulation: Supine, head of bed flat Toileting - Hygiene: Maximal assistance Where Assessed - Toileting Hygiene: Supine, head of bed flat;Rolling right and/or left Tub/Shower Transfer: Not assessed Equipment Used:  (Steady assist; BSC) ADL Comments: Attempted to transfer to [facility], however, despite multiple attempts, and problem solving through options, pt. unable to perform with max A.  Using steady A lift, was able to move pt. from sit to stand with max A, with bed height significantly elevated.  Pt. With significant edema bil. LE's - RN aware.  Vision/Perception    Cognition Cognition Arousal/Alertness: Awake/alert Overall Cognitive Status: Appears within functional limits for tasks assessed Sensation/Coordination   Extremity Assessment RUE Assessment RUE Assessment: Exceptions to [facility] RUE PROM (degrees) Right Elbow Flexion/Extension 0-135-150: 150  Right Forearm Pronation  0-80-90: 80 Degrees Right Wrist Extension 0-70:  (WFL) Right Composite Finger Extension:  ( hyperextension of MCPS and flexion of PIPS) Right Composite Finger Flexion:  (WFL) LUE Assessment LUE Assessment:  (intrinsic weakness noted) Mobility  Bed Mobility Bed Mobility: Yes Rolling Right: 4: Min  assist Rolling Left: 4: Min assist Supine to Sit: 4: Min assist Sitting - Scoot to Edge of Bed: 4: Min assist Sit to Supine -  Right: 3: Mod assist Exercises Shoulder Exercises Elbow Flexion: AAROM;Right;10 reps;Supine Elbow Extension: AAROM;Right;10 reps;Supine Hand Exercises Forearm Supination: AAROM;Right;10 reps;Supine Forearm Pronation: AAROM;Right;Supine;10 reps Wrist Flexion: AAROM;Right;10 reps;Supine Wrist Extension: AAROM;Right;10 reps;Supine Digit Composite Flexion: AAROM;Right;10 reps;Supine Composite Extension: AAROM;Right;10 reps;Supine End of Session OT - End of Session Equipment Utilized During Treatment:  (Steady lift) Activity Tolerance: Patient tolerated treatment well Patient left: in bed;with family/visitor present Nurse Communication: Mobility status for transfers;Other (comment) (proper position for sling and Rt. UE) General Behavior During Session:  (Anxious and tearful at times) Cognition:  (mildly delayed processing likely due to meds)   Porchia Sinkler, Ursula Alert M 07/07/2011, 3:01 PM

## 2011-07-07 NOTE — Plan of Care (Signed)
Problem: Phase II Progression Outcomes Goal: Activity appropriate for discharge plan Outcome: Progressing Pt currently has mobility issues, due to spinal cord compression previous to admission

## 2011-07-07 NOTE — Progress Notes (Signed)
Haley Kennedy  MRN: [id number] DOB/Age: [birth year] 65 y.o. Physician: Jacquelyne Balint Procedure: Procedure(s) (LRB): SHOULDER ARTHROSCOPY WITH OPEN ROTATOR CUFF REPAIR AND DISTAL CLAVICLE ACROMINECTOMY (Right) SHOULDER ARTHROSCOPY WITH DISTAL CLAVICLE RESECTION (Right)     Subjective: Legs are very weak, more weak than usual making it difficult to transfer to commode. Also unable to use right arm because of surgery, this combined with her neuromuscular disorder and urinary retention is making bathroom issues difficult. Also nausea and vomiting with iv pain meds.   Vital Signs Temp:  [97.5 F (36.4 C)-97.9 F (36.6 C)] 97.5 F (36.4 C) (11/10 0702) Pulse Rate:  [70-84] 70  (11/10 0702) Resp:  [18] 18  (11/10 0702) BP: (122-128)/(69-78) 122/78 mmHg (11/10 0702) SpO2:  [99 %-100 %] 99 % (11/10 0702)  Lab Results No results found for this basename: WBC:2,HGB:2,HCT:2,PLT:2 in the last 72 hours BMET No results found for this basename: NA:2,K:2,CL:2,CO2:2,GLUCOSE:2,BUN:2,CREATININE:2,CALCIUM:2 in the last 72 hours INR  Date Value Range Status  06/28/2011 0.97  0.00-1.49 (no units) Final     Exam Right shoulder dressing is dry and RUE NVI Having a local reaction to ted hose on skin of thighs.        Plan Foley placement until legs get a little stronger. Case mgr for disposition options.  Starlynn Klinkner for Dr.Kevin Supple 07/07/2011, 1:07 PM

## 2011-07-07 NOTE — Plan of Care (Signed)
Problem: Phase I Progression Outcomes Goal: OOB as tolerated unless otherwise ordered Outcome: Not Progressing Pt. Unable to transfer safely today with lift equipment.  Was able to move sit to stand with steady A lift, and max A from therapist with bed height significantly elevated

## 2011-07-07 NOTE — Patient Instructions (Signed)
Pt. And daughter were instructed in proper positioning for Rt. UE, sling wear and position - no adduction of UE.  Also discussed safe technique for bathing and dressing UE, but unable to practice this date due to pt. With increased pain, and focused on wanting/needing to use BSC.

## 2011-07-08 MED ORDER — DIPHENHYDRAMINE HCL 25 MG PO CAPS
25.0000 mg | ORAL_CAPSULE | Freq: Four times a day (QID) | ORAL | Status: DC | PRN
  Administered 2011-07-09 – 2011-07-10 (×2): 25 mg via ORAL
  Filled 2011-07-08 (×2): qty 1

## 2011-07-08 MED ORDER — DIPHENHYDRAMINE HCL 50 MG PO CAPS
50.0000 mg | ORAL_CAPSULE | Freq: Once | ORAL | Status: AC
  Administered 2011-07-08: 50 mg via ORAL
  Filled 2011-07-08: qty 1

## 2011-07-08 NOTE — Progress Notes (Addendum)
Haley Kennedy 65 y.o. 07/06/2011   Lab. Results: No results found for this basename: WBC:2,HGB:2,HCT:2,PLT:2 in the last 72 hours BMET No results found for this basename: NA:2,K:2,CL:2,CO2:2,GLUCOSE:2,BUN:2,CREATININE:2,CALCIUM:2,CBG:2 in the last 72 hours  INR  Date Value Range Status  06/28/2011 0.97  0.00-1.49 (no units) Final   VITALS Filed Vitals:   07/08/11 0603  BP: 91/36  Pulse: 77  Temp: 98.4 F (36.9 C)  Resp: 18     Subjective Patient has no significant complaints this morning she still has some difficulty with walking because of the generalized weakness. There Objective Dressings are clean dry and intact.  Neurologically intact.  No shortness of breath or chest pain.  Abdomen is soft and nontender.   Assessment/ Plan  patient is stable.  We'll continue the Foley.  Per Dr. Alda Berthold we will order a rehabilitation consult.  Continue current treatment plan.   Haley Kennedy D 11/11/201210:56 AM    CT scan of chest:  IMPRESSION:  1. No abnormality on the CT to correspond to the plain film  nodularity.  2. There is however, a small 3 mm nodule in the left upper lobe.  If the patient is at high risk for bronchogenic carcinoma, follow-  up chest CT at 1 year is recommended. If the patient is at low  risk, no follow-up is needed. This recommendation follows the  consensus statement: Guidelines for Management of Small Pulmonary  Nodules Detected on CT Scans:

## 2011-07-08 NOTE — Progress Notes (Addendum)
Physical Therapy Evaluation Patient Details Name: Haley Kennedy MRN: [id number] DOB: 07/09/1946 Today's Date: 07/08/2011  Problem List: There is no problem list on file for this patient.   Past Medical History:  Past Medical History  Diagnosis Date  . Ulcer   . Hyperlipidemia   . 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 Hyperlipidemia   . Shortness of breath     with exertion;pt states its bc shes not in shape   . Asthma     as a child  . Peripheral neuropathy   . Swelling     from knee down;takes furosemide daily  . Chronic back pain   . Arthritis     low back and neck  . Gastric ulcer   . Diverticulosis    Past Surgical History:  Past Surgical History  Procedure Date  . Tonsillectomy   . Knee surgery   . Abdominal hysterectomy     52yrs ago  . Cholecystectomy     1997  . Foot surgery     1963  . Back surgery     after back surgery had to have iron infusion  . Back surgery     2009    PT Assessment/Plan/Recommendation PT Assessment Clinical Impression Statement: 65yo female s/p Rotator cuff surgery presents with decreased I and safety with functional mobility secondary to decr. use of R UE and gross weakness due to premorbid status; will benefit from acute PT to maximize I and safety with mobility, transfers, activity tol, in prep for dc to next rehab venue PT Recommendation/Assessment: Patient will need skilled PT in the acute care venue PT Problem List: Decreased strength;Decreased range of motion;Decreased activity tolerance;Decreased balance;Decreased mobility;Decreased coordination;Decreased knowledge of use of DME Barriers to Discharge: None PT Therapy Diagnosis : Generalized weakness;Acute pain;Abnormality of gait;Difficulty walking PT Plan PT Frequency: Min 4X/week PT Treatment/Interventions: DME instruction;Gait training;Functional mobility training;Therapeutic exercise;Balance training;Neuromuscular re-education;Patient/family  education PT Recommendation Recommendations for Other Services: Rehab consult (PA made aware of request fro rehab consult) Follow Up Recommendations: Inpatient Rehab Equipment Recommended: Defer to next venue PT Goals  Acute Rehab PT Goals PT Goal Formulation: With patient Time For Goal Achievement: 2 weeks Pt will go Supine/Side to Sit: with modified independence;with HOB 0 degrees PT Goal: Supine/Side to Sit - Progress: Other (comment) Pt will go Sit to Supine/Side: with modified independence;with HOB 0 degrees PT Goal: Sit to Supine/Side - Progress: Other (comment) Pt will Transfer Bed to Chair/Chair to Bed: with min assist PT Transfer Goal: Bed to Chair/Chair to Bed - Progress: Other (comment) Pt will Ambulate: 1 - 15 feet;with min assist;with least restrictive assistive device;Other (comment) (possibly also with handheld A and no knee buckling) PT Goal: Ambulate - Progress: Other (comment) Pt will Perform Home Exercise Program: with supervision, verbal cues required/provided PT Goal: Perform Home Exercise Program - Progress: Other (comment)  PT Evaluation Precautions/Restrictions  Precautions Precautions: Shoulder Type of Shoulder Precautions: maintain Rt. shoulder in abducted position Required Braces or Orthoses: Yes Other Brace/Splint: Don Joy sling (Bilateral AFOs) NWB RUE Prior Functioning  Home Living Lives With: Spouse Receives Help From: Family Type of Home: House Home Layout: One level Home Access: Ramped entrance Bathroom Shower/Tub: Health visitor: Handicapped height How Accessible: Accessible via wheelchair Home Adaptive Equipment: Wheelchair - manual;Other (comment);Walker - rolling Prior Function Level of Independence: Needs assistance with ADLs;Needs assistance with homemaking Driving: Yes (prior to fall) Vocation: Retired Comments: see last filed by  OT Cognition Cognition Arousal/Alertness: Awake/alert Overall Cognitive Status:  Appears within functional limits for tasks assessed Orientation Level: Oriented X4 Sensation/Coordination Coordination Gross Motor Movements are Fluid and Coordinated: No (weakness, dependent on momentum) Coordination and Movement Description: dependent on momentum for transfers/gross movement Extremity Assessment RUE Assessment RUE Assessment: Not tested (In don joy sling) LUE Assessment LUE Assessment:  (weak) RLE Assessment RLE Assessment: Exceptions to [facility] RLE Strength RLE Overall Strength: Deficits;Due to premorbid status RLE Overall Strength Comments: grossly 3/5 quad; negligible ankle active rom; trace hip flexion LLE Assessment LLE Assessment: Exceptions to [facility] LLE Strength LLE Overall Strength: Deficits;Due to premorbid status LLE Overall Strength Comments: grossly 3/5 quad; negligible ankle active rom; trace hip flexion Mobility (including Balance) Bed Mobility Supine to Sit: 4: Min assist;HOB elevated (Comment degrees);With rails Supine to Sit Details (indicate cue type and reason): cues to scoot fully to eob; short reciprocal scoots Sitting - Scoot to Edge of Bed:  (cues to scoot fully to eob; short reciprocal scoots) Transfers Transfers: Yes Sit to Stand: 1: +2 Total assist;Patient percentage (comment) (pt=60%) Sit to Stand Details (indicate cue type and reason): dep on momentum/rocking; block/guard  knees; good push up from bed with L UE Stand to Sit: 1: +2 Total assist;Patient percentage (comment) (60%) Stand to Sit Details: cues for optimal positioning to seated surface; safety, hand placement Stand Pivot Transfers: Patient percentage (comment) (60%) Stand Pivot Transfer Details (indicate cue type and reason): toward Left side; close guard of bil knees in case of buckling; dependent on knee hyperext for stance stability; difficulty maintaining R UE NWB Ambulation/Gait Ambulation/Gait: Yes Ambulation/Gait Assistance: 1: +2 Total assist;Patient percentage (comment)  (pt=60%) Ambulation/Gait Assistance Details (indicate cue type and reason): toward Left side; close guard of bil knees in case of buckling; dependent on knee hyperext for stance stability Ambulation Distance (Feet): 3 Feet Assistive device: 2 person hand held assist Gait Pattern: Right foot flat;Left foot flat (very short sidesteps)    Exercise    End of Session PT - End of Session Equipment Utilized During Treatment: Gait belt (sling) Activity Tolerance: Patient tolerated treatment well Patient left: in chair;with call bell in reach Nurse Communication: Mobility status for transfers General Behavior During Session: [facility] for tasks performed (very forthcoming with ways she has managed at home) Cognition: [facility] for tasks performed  Van Clines [facility] 07/08/2011, 12:57 PM

## 2011-07-08 NOTE — Progress Notes (Signed)
Occupational Therapy Evaluation Patient Details Name: Haley Kennedy MRN: [id number] DOB: 1945/10/04 Today's Date: 07/08/2011  OT Assessment/Plan OT Assessment/Plan Comments on Treatment Session: Pt. is discouraged.  Pt. says she could not work so much with therapy yesterday because she had so much medicine.  PT worked with pt. prior to OT and pt. sitting in recliner chair when OT arrived.  Pt. refused sit>stand, but agreed to work with OT in a seated position.  OT educated patient about post-surgical pain and encourage pt. to do exercises with OT and to work with OT/PT as much as is offered.  Pt. said several times during exercises "I can't do this exercise, it hurts".  Pt. completed all exercises and pain level reported as "being okay" at end of OT session.  Pt. pain sites were appropriate for exercises (ex. pain in biceps when performing elbow flexion, pain in triceps when performing elbow extension).   Pt. also has redness on her arm where the sling is covering her.  OT slipped pillow case around pt. arm inside sling to help with skin integrity and notified nursing Haley Kennedy).  Pt. also has redness all over her back and on her thighs.  Nursing is aware .  Overall, pt. needs moderate encouragement to participate with OT and also needs moderate emotional support. OT Plan: Discharge plan remains appropriate OT Frequency: Min 2X/week Follow Up Recommendations: Inpatient Rehab Equipment Recommended: Defer to next venue OT Goals ADL Goals ADL Goal: Upper Body Bathing - Progress: Not addressed ADL Goal: Lower Body Bathing - Progress: Not addressed ADL Goal: Upper Body Dressing - Progress: Not addressed ADL Goal: Lower Body Dressing - Progress: Not addressed ADL Goal: Toilet Transfer - Progress: Not addressed ADL Goal: Toileting - Clothing Manipulation - Progress: Not addressed ADL Goal: Toileting - Hygiene - Progress: Not addressed ADL Goal: Additional Goal #1 - Progress: Not met Arm Goals Arm Goal:  AROM - Progress: Progressing toward goal  OT Treatment Precautions/Restrictions  Precautions Precautions: Shoulder Type of Shoulder Precautions: maintain right shoulder in abducted position and NWB Right UE Precaution Comments: Sling and RUE positioned correctly in don joy sling with abductor pillow  when OT arrived.   Required Braces or Orthoses: Yes Other Brace/Splint: Don joy sling with Abductor Pillow Restrictions Weight Bearing Restrictions: Yes Other Position/Activity Restrictions: NWB Right UE Coordination Gross Motor Movements are Fluid and Coordinated: No (weakness, dependent on momentum) Coordination and Movement Description: dependent on momentum for transfers/gross movement ADL ADL Eating/Feeding: Set up Eating/Feeding Details (indicate cue type and reason): husband setting up pt's meal when OT left Where Assessed - Eating/Feeding: Chair Grooming: Not assessed Upper Body Bathing: Not assessed Lower Body Bathing: Not assessed Upper Body Dressing: Not assessed Lower Body Dressing: Not assessed Toilet Transfer: Not assessed Toilet Transfer Method: Not assessed Toileting - Clothing Manipulation: Not assessed Toileting - Hygiene: Not assessed Tub/Shower Transfer: Not assessed Mobility  Bed Mobility Bed Mobility: No Rolling Right: Not tested (comment) Rolling Left: Not tested (comment) Supine to Sit: Not tested (comment) Supine to Sit Details (indicate cue type and reason): cues to scoot fully to eob; short reciprocal scoots Sitting - Scoot to Edge of Bed: 5: Supervision;Other (comment) (scooting to edge of chair for exercises) Sit to Supine - Right: Not Tested (comment) Transfers Sit to Stand: Not tested (comment) Sit to Stand Details (indicate cue type and reason): dep on momentum/rocking; block/guard  knees; good push up from bed with L UE Stand to Sit: Not tested (comment) Stand to Sit Details: cues  for optimal positioning to seated surface; safety, hand  placement Exercises Shoulder Exercises Elbow Flexion: 10 reps;Right;Seated;AROM;Other (comment) (max encouragement and manual facilitation) Elbow Extension: AROM;Right;10 reps;Seated;Other (comment) (max encouragement and manual facilitation) Wrist Flexion: AROM;Seated;Right;10 reps Wrist Extension: AROM;10 reps;Seated Digit Composite Flexion: AROM;10 reps;Seated;Right Composite Extension: AROM;Seated;Right;10 reps  End of Session OT - End of Session Activity Tolerance: Patient limited by pain;Other (comment) (pt. states "I wish I never had this surgery" "I'm miserable") Patient left: in chair;with call bell in reach;with family/visitor present Nurse Communication: Other (comment) (notified RN: redness on skin underneath sling) General Behavior During Session: [facility] for tasks performed Cognition: [facility] for tasks performed "07/08/2011 Haley Mccarrick K. Beatrice-Mitchell, MS, OTR/L Occupational Therapist Acute Rehabilitation [place] Phone: [phone] Pager: [phone] Summar [email or web address]   Haley Kennedy  07/08/2011, 1:45 PM

## 2011-07-09 ENCOUNTER — Encounter (HOSPITAL_COMMUNITY): Payer: Self-pay | Admitting: Orthopedic Surgery

## 2011-07-09 DIAGNOSIS — M7530 Calcific tendinitis of unspecified shoulder: Secondary | ICD-10-CM

## 2011-07-09 DIAGNOSIS — S43429A Sprain of unspecified rotator cuff capsule, initial encounter: Principal | ICD-10-CM

## 2011-07-09 HISTORY — DX: Sprain of unspecified rotator cuff capsule, initial encounter: S43.429A

## 2011-07-09 NOTE — Progress Notes (Signed)
Physical Therapy Treatment Patient Details Name: SIMYA TERCERO MRN: [id number] DOB: 1945-09-30 Today's Date: 07/09/2011  PT Assessment/Plan  PT - Assessment/Plan Comments on Treatment Session: improving activity tolerance and confidence with transfers; husband present during session; discussed possibility of an inpt rehab stay, pt/husband agreeable PT Plan: Discharge plan remains appropriate PT Frequency: Min 4X/week Follow Up Recommendations: Inpatient Rehab Equipment Recommended: Defer to next venue PT Goals  Acute Rehab PT Goals PT Goal: Supine/Side to Sit - Progress: Other (comment) PT Goal: Sit to Supine/Side - Progress: Other (comment) PT Transfer Goal: Bed to Chair/Chair to Bed - Progress: Progressing toward goal PT Goal: Ambulate - Progress: Progressing toward goal PT Goal: Perform Home Exercise Program - Progress: Other (comment)  PT Treatment Precautions/Restrictions  Precautions Precautions: Shoulder Type of Shoulder Precautions: maintain right shoulder in abducted position and NWB Right UE Precaution Comments: Sling and RUE positioned correctly in don joy sling with abductor pillow  when OT arrived.   Required Braces or Orthoses: Yes Other Brace/Splint: Don joy sling with Abductor Pillow Restrictions Weight Bearing Restrictions: Yes Other Position/Activity Restrictions: NWB Right UE Mobility (including Balance) Transfers Sit to Stand: 1: +2 Total assist;Patient percentage (comment);From elevated surface;From chair/3-in-1 (pt=60%) Sit to Stand Details (indicate cue type and reason): continued dependence on momentum Stand to Sit: 1: +2 Total assist;Patient percentage (comment);To chair/3-in-1 (seat height elevated; pt=60%) Stand to Sit Details: difficulty controlling descent Stand Pivot Transfers: 1: +2 Total assist Stand Pivot Transfer Details (indicate cue type and reason): pt=65%; chair to 3in1 on patient's L side Ambulation/Gait Ambulation/Gait Assistance: 1: +2  Total assist;Patient percentage (comment) (pt=65%) Ambulation/Gait Assistance Details (indicate cue type and reason): mod physical A to fully extend hips for upright standing prior to taking steps from bedside commode back to recliner; heavy dependence on UE support; noted some WBing through R forearm (on therapist's arm, which was holding gait belt) Ambulation Distance (Feet): 3 Feet Assistive device: 2 person hand held assist (except support given at trunk/gait belt on Right) Gait Pattern: Decreased step length - right;Decreased step length - left (dependent on knee hyperext. for stance stability) Stairs: No Wheelchair Mobility Wheelchair Mobility: No    Exercise    End of Session PT - End of Session Equipment Utilized During Treatment: Gait belt Activity Tolerance: Patient tolerated treatment well Patient left: in chair;with call bell in reach;with family/visitor present Nurse Communication: Mobility status for transfers General Behavior During Session: [facility] for tasks performed Cognition: [facility] for tasks performed  [place], [employer] [phone] 07/09/2011, 3:41 PM

## 2011-07-09 NOTE — Progress Notes (Signed)
Pt doing well this morning sitting up in chair in no acute distress. Right shoulder with well healing wound. nv intact distally. Sling in place.  Will await news from inpatient rehab in regards to admission or not. CIR would be the best place for this patient in regards to her social status. Continue gentle rom and PT/OT as tolerated. Pt is ready for discharge to inpatient rehab when a bed is available.

## 2011-07-09 NOTE — Consult Note (Signed)
Physical Medicine and Rehabilitation Consult Reason for Consult:right shoulder arthroscopy Referring Phsyician: dr Eduardo Osier Haley Kennedy is an 65 y.o. female.   HPI: This is a 65 year old white female with a history of Charcot-Marie-Tooth. She has had multiple bilateral foot surgeries as well as back surgery in the past limiting her to a rolling walker. Present 07/04/2011 after recent fall with right shoulder pain. X-ray of right shoulder showed a supraspinatus rotator cuff tear with some mild retraction along with a.c. arthrosis. Patient underwent right shoulder arthroscopy with open rotator cuff repair and distal clavicle acrominectomy  per Dr. Devonne Doughty on 11 9. She was placed in a shoulder sling with limited weight bearing precautions and pendulum exercises. She currently requires total assistance to ambulate  3 feet with an assistive device. She does have bilateral AFO braces for her history of Charcot-Marie-Tooth. She is currently total assist for activities of daily living due to her right shoulder surgery. She was seen by inpatient rehabilitation services to be evaluated for comprehensive rehabilitation program.  Review of Systems  Constitutional: Negative.   HENT: Negative for congestion.   Eyes: Negative.   Respiratory: Negative for cough.   Cardiovascular: Negative for chest pain and orthopnea.  Gastrointestinal: Positive for constipation. Negative for nausea.  Genitourinary: Negative.   Musculoskeletal: Positive for myalgias and joint pain.  Skin: Negative.   Neurological: Negative for dizziness and headaches.  Endo/Heme/Allergies: Negative.   Psychiatric/Behavioral: Negative.    Past Medical History  Diagnosis Date  . Ulcer   . Hyperlipidemia   . 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 Hyperlipidemia   . Shortness of breath     with exertion;pt states its bc shes not in shape   . Asthma     as a child  . Peripheral neuropathy   . Swelling     from knee  down;takes furosemide daily  . Chronic back pain   . Arthritis     low back and neck  . Gastric ulcer   . Diverticulosis    Past Surgical History  Procedure Date  . Tonsillectomy   . Knee surgery   . Abdominal hysterectomy     27yrs ago  . Cholecystectomy     1997  . Foot surgery     1963  . Back surgery     after back surgery had to have iron infusion  . Back surgery     2009   History reviewed. No pertinent family history. Social History:  reports that she quit smoking about 32 years ago. She has never used smokeless tobacco. She reports that she does not drink alcohol or use illicit drugs. Allergies:  Allergies  Allergen Reactions  . Corticosteroids     CMT Disease  . Penicillins Other (See Comments)    Increased body temp  . 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 Vincristine     CMT disease  . Ciprofloxacin Rash   Medications Prior to Admission  Medication Dose Route Frequency Provider Last Rate Last Dose  . acetaminophen (TYLENOL) tablet 650 mg  650 mg Oral Q6H PRN Verlee Rossetti       Or  . acetaminophen (TYLENOL) suppository 650 mg  650 mg Rectal Q6H PRN Verlee Rossetti      . bisacodyl (DULCOLAX) EC tablet 10 mg  10 mg Oral Daily PRN Verlee Rossetti   10 mg at 07/08/11 1610  Or  . bisacodyl (DULCOLAX) suppository 10 mg  10 mg Rectal Daily PRN Verlee Rossetti      . diphenhydrAMINE (BENADRYL) capsule 25 mg  25 mg Oral Q6H PRN Gwinda Maine      . diphenhydrAMINE (BENADRYL) capsule 50 mg  50 mg Oral Once Gwinda Maine   50 mg at 07/08/11 2043  . docusate sodium (COLACE) capsule 100 mg  100 mg Oral BID Verlee Rossetti   100 mg at 07/08/11 0957  . enoxaparin (LOVENOX) injection 40 mg  40 mg Subcutaneous Q24H Verlee Rossetti   40 mg at 07/07/11 1304  . furosemide (LASIX) tablet 20 mg  20 mg Oral Daily Verlee Rossetti   20 mg at 07/08/11 0957  . HYDROcodone-acetaminophen (NORCO) 5-325 MG per tablet 1-2 tablet  1-2  tablet Oral Q4H PRN Verlee Rossetti   1 tablet at 07/08/11 1938  . HYDROmorphone (DILAUDID) injection 0.5-1 mg  0.5-1 mg Intravenous Q2H PRN Verlee Rossetti   1 mg at 07/07/11 1610  . HYDROmorphone (DILAUDID) tablet 2 mg  2 mg Oral Q3H PRN Ralene Bathe, PA   2 mg at 07/08/11 0957  . magnesium hydroxide (MILK OF MAGNESIA) suspension 30 mL  30 mL Oral Q12H PRN Verlee Rossetti      . menthol-cetylpyridinium (CEPACOL) lozenge 3 mg  1 lozenge Oral PRN Verlee Rossetti       Or  . phenol (CHLORASEPTIC) mouth spray 1 spray  1 spray Mouth/Throat PRN Verlee Rossetti      . methocarbamol (ROBAXIN) tablet 500 mg  500 mg Oral Q6H PRN Verlee Rossetti   500 mg at 07/08/11 1938   Or  . methocarbamol (ROBAXIN) 500 mg in dextrose 5 % 50 mL IVPB  500 mg Intravenous Q6H PRN Verlee Rossetti      . metoCLOPramide (REGLAN) tablet 5-10 mg  5-10 mg Oral Q8H PRN Verlee Rossetti   10 mg at 07/08/11 1343   Or  . metoCLOPramide (REGLAN) injection 5-10 mg  5-10 mg Intravenous Q8H PRN Verlee Rossetti      . metoprolol succinate (TOPROL-XL) 24 hr tablet 25 mg  25 mg Oral Daily Verlee Rossetti   25 mg at 07/08/11 0957  . ondansetron (ZOFRAN) tablet 4 mg  4 mg Oral Q6H PRN Verlee Rossetti       Or  . ondansetron [facility]) injection 4 mg  4 mg Intravenous Q6H PRN Verlee Rossetti   4 mg at 07/07/11 0004  . polyethylene glycol (MIRALAX / GLYCOLAX) packet 17 g  17 g Oral Daily PRN Verlee Rossetti      . sodium phosphate (FLEET) 7-19 GM/118ML enema 1 enema  1 enema Rectal Daily PRN Verlee Rossetti      . vancomycin (VANCOCIN) IVPB 1000 mg/200 mL premix  1,000 mg Intravenous Once Verlee Rossetti   1,000 mg at 07/07/11 9604  . DISCONTD: 0.9 % NaCl with KCl 20 mEq/ L  infusion   Intravenous Continuous Verlee Rossetti      . DISCONTD: bupivacaine-EPINEPHrine (MARCAINE W/ EPI) 0.25 % (with pres) injection    PRN Verlee Rossetti   8 mL at 07/06/11 5409  . DISCONTD: ceFAZolin (ANCEF) IVPB 2 g/50 mL premix  2 g Intravenous 60 min Pre-Op  Verlee Rossetti      . DISCONTD: Grape Seed CAPS 60 mg  1 capsule Oral Daily Verlee Rossetti      .  DISCONTD: HYDROmorphone (DILAUDID) injection 0.25-0.5 mg  0.25-0.5 mg Intravenous Q5 min PRN Kerby Nora, MD      . DISCONTD: meperidine (DEMEROL) injection 6.25-12.5 mg  6.25-12.5 mg Intravenous PRN Kerby Nora, MD      . DISCONTD: morphine 2 MG/ML injection 4.8 mg  0.05 mg/kg Intravenous Q10 min PRN Kerby Nora, MD      . DISCONTD: ondansetron [facility]) injection 4 mg  4 mg Intravenous Once PRN Kerby Nora, MD      . DISCONTD: sodium chloride 0.9 % irrigation    PRN Verlee Rossetti   3,000 mL at 07/06/11 1610  . DISCONTD: vancomycin (VANCOCIN) IVPB 1000 mg/200 mL premix  1,000 mg Intravenous 60 min Pre-Op Verlee Rossetti       Medications Prior to Admission  Medication Sig Dispense Refill  . furosemide (LASIX) 40 MG tablet Take 20 mg by mouth daily as needed. Takes at lunchtime; For lower extremity edema      . metoprolol (TOPROL-XL) 50 MG 24 hr tablet Take 25 mg by mouth daily.          Home: Home Living Lives With: Spouse Receives Help From: Family Type of Home: House Home Layout: One level Home Access: Ramped entrance Bathroom Shower/Tub: Health visitor: Handicapped height Bathroom Accessibility: Yes How Accessible: Accessible via wheelchair Home Adaptive Equipment: Wheelchair - manual;Other (comment);Walker - rolling  Functional History: Prior Function Level of Independence: Needs assistance with ADLs;Needs assistance with homemaking Bath: Moderate (Mod I prior to fall 9/12) Toileting: Minimal (modified I prior to fall 9/12) Dressing: Moderate (Modified I prior to fall 9/12) Meal Prep: Maximal (Modified I prior to fall 9/12) Light Housekeeping: Maximal (Modified I prior to fall 9/12) Driving: Yes (prior to fall) Vocation: Retired Comments: see last filed by OT Functional Status:  Mobility: Bed Mobility Bed Mobility: No Rolling Right: Not tested  (comment) Rolling Left: Not tested (comment) Supine to Sit: Not tested (comment) Supine to Sit Details (indicate cue type and reason): cues to scoot fully to eob; short reciprocal scoots Sitting - Scoot to Edge of Bed: 5: Supervision;Other (comment) (scooting to edge of chair for exercises) Sit to Supine - Right: Not Tested (comment) Transfers Transfers: Yes Sit to Stand: Not tested (comment) Sit to Stand Details (indicate cue type and reason): dep on momentum/rocking; block/guard  knees; good push up from bed with L UE Stand to Sit: Not tested (comment) Stand to Sit Details: cues for optimal positioning to seated surface; safety, hand placement Stand Pivot Transfers: Patient percentage (comment) (60%) Stand Pivot Transfer Details (indicate cue type and reason): toward Left side; close guard of bil knees in case of buckling; dependent on knee hyperext for stance stability Ambulation/Gait Ambulation/Gait: Yes Ambulation/Gait Assistance: 1: +2 Total assist;Patient percentage (comment) (pt=60%) Ambulation/Gait Assistance Details (indicate cue type and reason): toward Left side; close guard of bil knees in case of buckling; dependent on knee hyperext for stance stability Ambulation Distance (Feet): 3 Feet Assistive device: 2 person hand held assist Gait Pattern: Right foot flat;Left foot flat (very short sidesteps)    ADL: ADL Eating/Feeding: Set up Eating/Feeding Details (indicate cue type and reason): husband setting up pt's meal when OT left Where Assessed - Eating/Feeding: Chair Grooming: Not assessed Where Assessed - Grooming: Sitting, bed;Unsupported Upper Body Bathing: Not assessed Where Assessed - Upper Body Bathing: Sitting, bed;Unsupported Lower Body Bathing: Not assessed Lower Body Bathing Details (indicate cue type and reason): Pt. best able to access feet in  supine or longsitting Where Assessed - Lower Body Bathing: Supine, head of bed up;Sit to stand from bed Upper Body  Dressing: Not assessed Where Assessed - Upper Body Dressing: Sitting, bed;Unsupported Lower Body Dressing: Not assessed Lower Body Dressing Details (indicate cue type and reason): Total assist to don bil. AFOs, socks, and shoes Where Assessed - Lower Body Dressing: Sit to stand from bed Toilet Transfer: Not assessed Toilet Transfer Method: Not assessed Toilet Transfer Equipment: Bedside commode Toileting - Clothing Manipulation: Not assessed Where Assessed - Toileting Clothing Manipulation: Supine, head of bed flat Toileting - Hygiene: Not assessed Where Assessed - Toileting Hygiene: Supine, head of bed flat;Rolling right and/or left Tub/Shower Transfer: Not assessed Equipment Used:  (Steady assist; BSC) ADL Comments: Attempted to transfer to [facility], however, despite multiple attempts, and problem solving through options, pt. unable to perform with max A.  Using steady A lift, was able to move pt. from sit to stand with max A, with bed height significantly elevated  Cognition: Cognition Arousal/Alertness: Awake/alert Orientation Level: Oriented X4 Cognition Arousal/Alertness: Awake/alert Overall Cognitive Status: Appears within functional limits for tasks assessed Orientation Level: Oriented X4  Blood pressure 91/55, pulse 76, temperature 98 F (36.7 C), temperature source Oral, resp. rate 18, height 5\' 6"  (1.676 m), weight 96.1 kg (211 lb 13.8 oz), SpO2 92.00%. Physical Exam  Constitutional: She is oriented to person, place, and time. She appears well-developed.  HENT:  Head: Normocephalic.  Eyes: Right eye exhibits no discharge.  Neck: Neck supple. No thyromegaly present.  Cardiovascular: Normal rate.   Pulmonary/Chest: Effort normal.  Abdominal: She exhibits no distension. There is no tenderness.  Musculoskeletal:       Shoulder sling to right shoulder  Neurological: She is alert and oriented to person, place, and time. A sensory deficit is present.  Reflex Scores:      Tricep  reflexes are 1+ on the right side and 1+ on the left side.      Bicep reflexes are 1+ on the right side and 1+ on the left side.      Brachioradialis reflexes are 1+ on the right side and 1+ on the left side.      Patellar reflexes are 0 on the right side and 0 on the left side.      Achilles reflexes are 0 on the right side and 0 on the left side.      Patient with bilateral sensory loss to pinprick and light touch growing increasingly significant from the thighs to the toes. Patient with bilateral atrophy of the distal musculature. She bilateral foot drop. Quadriceps hamstring strength is approximately 2-3/5. Hip strength is grossly 2/5. Upper shimmy strength on the left side is within functional limits. Right-sided handgrip is 3-4/5. I wasn'Haley able to test her proximal musculature due to her splint.  Skin: Skin is warm and dry.       Incision dressed    No results found for this or any previous visit (from the past 24 hour(s)). Ct Chest Wo Contrast  07/07/2011  *RADIOLOGY REPORT*  Clinical Data: Abnormal chest radiograph revealed.  No evidence  CT CHEST WITHOUT CONTRAST  Technique:  Multidetector CT imaging of the chest was performed following the standard protocol without IV contrast.  Comparison: Plain film 06/28/2011  Findings: There is a tiny 3 mm nodule in  the left upper lobe (image 11).  It is unlikely that this is the plain film abnormality due to its small size.  Tiny 2 mm nodule  in the right lung,  middle lobe (image 27).  There is linear atelectasis at the right lung base.  No axillary or supraclavicular lymphadenopathy.  No mediastinal or hilar adenopathy.  No pericardial fluid.  Limited view of the upper abdomen demonstrates diffuse fatty infiltration the liver.  Adrenal glands are normal.  Limited view of the skeleton demonstrates gas within the soft tissues surrounding the right shoulder consistent with recent surgery.  IMPRESSION:  1.  No abnormality on the CT to correspond to the  plain film nodularity. 2.  There is however, a small 3 mm nodule in the left upper lobe. If the patient is at high risk for bronchogenic carcinoma, follow- up chest CT at 1 year is recommended.  If the patient is at low risk, no follow-up is needed.  This recommendation follows the consensus statement: Guidelines for Management of Small Pulmonary Nodules Detected on CT Scans:  A Statement from the Fleischner Society as published in Radiology 2005; 237:395-400.  Available online at:  [email or web address].cz.  Original Report Authenticated By: Genevive Bi, M.D.    Assessment/Plan: Diagnosis:  Rotator cuff tear s/p open rotator cuff repair and distal clavicle acrominectomy.  Hx of CMT 1. Does the need for close, 24 hr/day medical supervision in concert with the patient's rehab needs make it unreasonable for this patient to be served in a less intensive setting? Yes 2. Co-Morbidities requiring supervision/potential complications: CMT, wound care issues, pain mgt 3. Due to bladder management, bowel management, safety, skin/wound care, disease management, medication administration, pain management and patient education, does the patient require 24 hr/day rehab nursing? Yes 4. Does the patient require coordinated care of a physician, rehab nurse, PT (1-2 hrs/day, 5 days/week) and OT (1-2 hrs/day, 5 days/week) to address physical and functional deficits in the context of the above medical diagnosis(es)? Yes Addressing deficits in the following areas: balance, bathing, bowel/bladder control, endurance, grooming, psychosocial adjustment, strength, toileting and transferring 5. Can the patient actively participate in an intensive therapy program of at least 3 hrs of therapy per day at least 5 days per week? Yes 6. The potential for patient to make measurable gains while on inpatient rehab is excellent 7. Anticipated functional outcomes upon discharge from inpatients are w/c min  assist  PT, w/c min to mod assist OT, n/a SLP 8. Estimated rehab length of stay to reach the above functional goals is: 2  Weeks 9. Does the patient have adequate social supports to accommodate these discharge functional goals? Yes 10. Anticipated D/C setting: Home 11. Anticipated post D/C treatments: HH therapy 12. Overall Rehab/Functional Prognosis: excellent  RECOMMENDATIONS: This patient's condition is appropriate for continued rehabilitative care in the following setting: CIR Patient has agreed to participate in recommended program. Yes Note that insurance prior authorization may be required for reimbursement for recommended care.  Comment: Rehab RN to f/u.   Husband needs substantial education based upon history and patient's account today.   ANGIULLI,DANIEL J. 07/09/2011

## 2011-07-09 NOTE — Plan of Care (Signed)
Problem: Phase I Progression Outcomes Goal: Voiding-avoid urinary catheter unless indicated Outcome: Completed/Met Date Met:  07/09/11 Foley d/c'd today

## 2011-07-10 LAB — CBC
Hemoglobin: 12.4 g/dL (ref 12.0–15.0)
MCH: 30.8 pg (ref 26.0–34.0)
MCV: 89.1 fL (ref 78.0–100.0)
Platelets: 188 10*3/uL (ref 150–400)
RBC: 4.02 MIL/uL (ref 3.87–5.11)
WBC: 9.1 10*3/uL (ref 4.0–10.5)

## 2011-07-10 LAB — CREATININE, SERUM
Creatinine, Ser: 0.52 mg/dL (ref 0.50–1.10)
GFR calc Af Amer: >90 mL/min (ref >90)

## 2011-07-10 MED ORDER — PROMETHAZINE HCL 12.5 MG PO TABS: 12.5000 mg | ORAL_TABLET | Freq: Four times a day (QID) | ORAL | Status: DC | PRN

## 2011-07-10 MED ORDER — ACETAMINOPHEN 650 MG RE SUPP: 650.0000 mg | Freq: Four times a day (QID) | RECTAL | Status: DC | PRN

## 2011-07-10 MED ORDER — ACETAMINOPHEN 325 MG PO TABS: 650.0000 mg | ORAL_TABLET | Freq: Four times a day (QID) | ORAL | Status: DC | PRN

## 2011-07-10 MED ORDER — PROMETHAZINE HCL 25 MG RE SUPP: 12.5000 mg | Freq: Four times a day (QID) | RECTAL | Status: DC | PRN

## 2011-07-10 MED ORDER — FUROSEMIDE 20 MG PO TABS: 20.0000 mg | ORAL_TABLET | Freq: Every day | ORAL | Status: DC

## 2011-07-10 MED ORDER — PROMETHAZINE HCL 25 MG/ML IJ SOLN: 12.5000 mg | Freq: Four times a day (QID) | INTRAMUSCULAR | Status: DC | PRN

## 2011-07-10 MED ORDER — POLYETHYLENE GLYCOL 3350 17 G PO PACK: 17.0000 g | PACK | Freq: Every day | ORAL | Status: DC | PRN

## 2011-07-10 MED ORDER — HYDROCODONE-ACETAMINOPHEN 5-325 MG PO TABS: 1.0000 | ORAL_TABLET | ORAL | Status: DC | PRN

## 2011-07-10 MED ORDER — ENOXAPARIN SODIUM 40 MG/0.4ML ~~LOC~~ SOLN: 40.0000 mg | SUBCUTANEOUS | Status: DC

## 2011-07-10 MED ORDER — METHOCARBAMOL 500 MG PO TABS: 500.0000 mg | ORAL_TABLET | Freq: Four times a day (QID) | ORAL | Status: DC | PRN

## 2011-07-10 MED ORDER — SENNOSIDES-DOCUSATE SODIUM 8.6-50 MG PO TABS: 2.0000 | ORAL_TABLET | Freq: Every day | ORAL | Status: DC

## 2011-07-10 MED ORDER — GUAIFENESIN-DM 100-10 MG/5ML PO SYRP
5.0000 mL | ORAL_SOLUTION | Freq: Four times a day (QID) | ORAL | Status: DC | PRN
  Filled 2011-07-10: qty 10

## 2011-07-10 MED ORDER — ALUM & MAG HYDROXIDE-SIMETH 400-400-40 MG/5ML PO SUSP
30.0000 mL | ORAL | Status: DC | PRN
  Filled 2011-07-10: qty 30

## 2011-07-10 MED ORDER — METHOCARBAMOL 100 MG/ML IJ SOLN: 500.0000 mg | Freq: Four times a day (QID) | INTRAVENOUS | Status: DC | PRN

## 2011-07-10 MED ORDER — SORBITOL 70 % SOLN
30.0000 mL | Freq: Two times a day (BID) | Status: DC | PRN
  Filled 2011-07-10: qty 30

## 2011-07-10 NOTE — H&P (Signed)
Physical Medicine and Rehabilitation Admission H&P    HPI; Haley Kennedy is an 65 y.o. female with a history of Charcot-Marie-Tooth. She has had multiple bilateral foot surgeries as well as back surgery in the past limiting her to a rolling walker. Present 07/04/2011 after recent fall with right shoulder pain. X-ray of right shoulder showed a supraspinatus rotator cuff tear with some mild retraction along with a.c. arthrosis. Patient underwent right shoulder arthroscopy with open rotator cuff repair and distal clavicle acrominectomy per Dr. Devonne Doughty on 11 9. She was placed in a shoulder sling with limited weight bearing precautions and pendulum exercises. She currently requires total assistance to ambulate 3 feet with an assistive device. She does have bilateral AFO braces for her history of Charcot-Marie-Tooth. She is currently total assist for activities of daily living due to her right shoulder surgery. She was seen by inpatient rehabilitation services to be evaluated for comprehensive rehabilitation program    Review of Systems  Constitutional: Negative.  HENT: Negative for congestion.  Eyes: Negative.  Respiratory: Negative for cough.  Cardiovascular: Negative for chest pain and orthopnea.  Gastrointestinal: Positive for constipation. Negative for nausea.  Genitourinary: Negative.  Musculoskeletal: Positive for myalgias and joint pain.  Skin: Negative.  Neurological: Negative for dizziness and headaches.  Endo/Heme/Allergies: Negative.  Psychiatric/Behavioral: Negative    Past Medical History  Diagnosis Date  . Ulcer   . Hyperlipidemia   . 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 Hyperlipidemia   . Shortness of breath     with exertion;pt states its bc shes not in shape   . Asthma     as a child  . Peripheral neuropathy   . Swelling     from knee down;takes furosemide daily  . Chronic back pain   . Arthritis     low back and neck  . Gastric ulcer   .  Diverticulosis   . Rotator cuff (capsule) sprain 07/09/2011   Past Surgical History  Procedure Date  . Tonsillectomy   . Knee surgery   . Abdominal hysterectomy     29yrs ago  . Cholecystectomy     1997  . Foot surgery     1963  . Back surgery     after back surgery had to have iron infusion  . Back surgery     2009   History reviewed. No pertinent family history. Social History:  reports that she quit smoking about 32 years ago. She has never used smokeless tobacco. She reports that she does not drink alcohol or use illicit drugs. Allergies:  Allergies  Allergen Reactions  . Corticosteroids     CMT Disease  . Penicillins Other (See Comments)    Increased body temp  . 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 Vincristine     CMT disease  . Ciprofloxacin Rash   Medications Prior to Admission  Medication Dose Route Frequency Provider Last Rate Last Dose  . acetaminophen (TYLENOL) tablet 650 mg  650 mg Oral Q6H PRN Verlee Rossetti   325 mg at 07/09/11 1359   Or  . acetaminophen (TYLENOL) suppository 650 mg  650 mg Rectal Q6H PRN Verlee Rossetti      . bisacodyl (DULCOLAX) EC tablet 10 mg  10 mg Oral Daily PRN Verlee Rossetti   5 mg at 07/10/11 1019   Or  . bisacodyl (DULCOLAX) suppository 10 mg  10 mg Rectal Daily PRN Verlee Rossetti      . diphenhydrAMINE (BENADRYL) capsule 25 mg  25 mg Oral Q6H PRN Lowry Bowl Kovach   25 mg at 07/09/11 2216  . diphenhydrAMINE (BENADRYL) capsule 50 mg  50 mg Oral Once Gwinda Maine   50 mg at 07/08/11 2043  . docusate sodium (COLACE) capsule 100 mg  100 mg Oral BID Verlee Rossetti   100 mg at 07/10/11 1019  . enoxaparin (LOVENOX) injection 40 mg  40 mg Subcutaneous Q24H Verlee Rossetti   40 mg at 07/09/11 1241  . furosemide (LASIX) tablet 20 mg  20 mg Oral Daily Verlee Rossetti   20 mg at 07/08/11 0957  . HYDROcodone-acetaminophen (NORCO) 5-325 MG per tablet 1-2 tablet  1-2 tablet Oral Q4H PRN  Verlee Rossetti   1 tablet at 07/10/11 1019  . HYDROmorphone (DILAUDID) injection 0.5-1 mg  0.5-1 mg Intravenous Q2H PRN Verlee Rossetti   1 mg at 07/07/11 1610  . HYDROmorphone (DILAUDID) tablet 2 mg  2 mg Oral Q3H PRN Ralene Bathe, PA   2 mg at 07/08/11 0957  . magnesium hydroxide (MILK OF MAGNESIA) suspension 30 mL  30 mL Oral Q12H PRN Verlee Rossetti      . menthol-cetylpyridinium (CEPACOL) lozenge 3 mg  1 lozenge Oral PRN Verlee Rossetti       Or  . phenol (CHLORASEPTIC) mouth spray 1 spray  1 spray Mouth/Throat PRN Verlee Rossetti      . methocarbamol (ROBAXIN) tablet 500 mg  500 mg Oral Q6H PRN Verlee Rossetti   500 mg at 07/08/11 1938   Or  . methocarbamol (ROBAXIN) 500 mg in dextrose 5 % 50 mL IVPB  500 mg Intravenous Q6H PRN Verlee Rossetti      . metoCLOPramide (REGLAN) tablet 5-10 mg  5-10 mg Oral Q8H PRN Verlee Rossetti   10 mg at 07/08/11 1343   Or  . metoCLOPramide (REGLAN) injection 5-10 mg  5-10 mg Intravenous Q8H PRN Verlee Rossetti      . metoprolol succinate (TOPROL-XL) 24 hr tablet 25 mg  25 mg Oral Daily Verlee Rossetti   25 mg at 07/10/11 1019  . ondansetron (ZOFRAN) tablet 4 mg  4 mg Oral Q6H PRN Verlee Rossetti       Or  . ondansetron [facility]) injection 4 mg  4 mg Intravenous Q6H PRN Verlee Rossetti   4 mg at 07/07/11 0004  . polyethylene glycol (MIRALAX / GLYCOLAX) packet 17 g  17 g Oral Daily PRN Verlee Rossetti      . sodium phosphate (FLEET) 7-19 GM/118ML enema 1 enema  1 enema Rectal Daily PRN Verlee Rossetti      . vancomycin (VANCOCIN) IVPB 1000 mg/200 mL premix  1,000 mg Intravenous Once Verlee Rossetti   1,000 mg at 07/07/11 9604  . DISCONTD: 0.9 % NaCl with KCl 20 mEq/ L  infusion   Intravenous Continuous Verlee Rossetti      . DISCONTD: bupivacaine-EPINEPHrine (MARCAINE W/ EPI) 0.25 % (with pres) injection    PRN Verlee Rossetti   8 mL at 07/06/11 5409  . DISCONTD: ceFAZolin (ANCEF) IVPB 2 g/50 mL premix  2 g Intravenous 60 min Pre-Op Verlee Rossetti      .  DISCONTD: Grape Seed CAPS 60 mg  1 capsule Oral Daily Verlee Rossetti      . DISCONTD: HYDROmorphone (  DILAUDID) injection 0.25-0.5 mg  0.25-0.5 mg Intravenous Q5 min PRN Kerby Nora, MD      . DISCONTD: meperidine (DEMEROL) injection 6.25-12.5 mg  6.25-12.5 mg Intravenous PRN Kerby Nora, MD      . DISCONTD: morphine 2 MG/ML injection 4.8 mg  0.05 mg/kg Intravenous Q10 min PRN Kerby Nora, MD      . DISCONTD: ondansetron [facility]) injection 4 mg  4 mg Intravenous Once PRN Kerby Nora, MD      . DISCONTD: sodium chloride 0.9 % irrigation    PRN Verlee Rossetti   3,000 mL at 07/06/11 0981  . DISCONTD: vancomycin (VANCOCIN) IVPB 1000 mg/200 mL premix  1,000 mg Intravenous 60 min Pre-Op Verlee Rossetti       Medications Prior to Admission  Medication Sig Dispense Refill  . furosemide (LASIX) 40 MG tablet Take 20 mg by mouth daily as needed. Takes at lunchtime; For lower extremity edema      . metoprolol (TOPROL-XL) 50 MG 24 hr tablet Take 25 mg by mouth daily.          Home: Home Living Lives With: Spouse Receives Help From: Family Type of Home: House Home Layout: One level Home Access: Ramped entrance Bathroom Shower/Tub: Health visitor: Handicapped height Bathroom Accessibility: Yes How Accessible: Accessible via wheelchair Home Adaptive Equipment: Wheelchair - manual;Other (comment);Walker - rolling   Functional History: Prior Function Level of Independence: Needs assistance with ADLs;Needs assistance with homemaking Bath: Moderate (Mod I prior to fall 9/12) Toileting: Minimal (modified I prior to fall 9/12) Dressing: Moderate (Modified I prior to fall 9/12) Meal Prep: Maximal (Modified I prior to fall 9/12) Light Housekeeping: Maximal (Modified I prior to fall 9/12) Driving: Yes (prior to fall) Vocation: Retired Comments: see last filed by OT  Functional Status:  Mobility: Bed Mobility Bed Mobility: Yes Rolling Right: Not tested (comment) Rolling  Left: Not tested (comment) Supine to Sit: Not tested (comment) Supine to Sit Details (indicate cue type and reason): cues to scoot fully to eob; short reciprocal scoots Sitting - Scoot to Edge of Bed: 5: Supervision;Other (comment) (scooting to edge of chair for exercises) Sit to Supine - Right: 4: Min assist;HOB flat;With rail Sit to Supine - Right Details (indicate cue type and reason): A for correct position and LE placement. Cues to protect right shoulder Transfers Transfers: Yes Sit to Stand: 1: +2 Total assist;Patient percentage (comment);From chair/3-in-1;From elevated surface (40%) Sit to Stand Details (indicate cue type and reason): A to initiated stand as patient had been up in recliner since 2am. Pt stood x2 from recliner and 3n1. Cues to ensure safe technique Stand to Sit: 1: +2 Total assist;Patient percentage (comment);To chair/3-in-1;To bed Stand to Sit Details: A and cues to control descent onto 3n1 and bed. Cues for safe hand placement.  Stand Pivot Transfers: 1: +2 Total assist Stand Pivot Transfer Details (indicate cue type and reason): p=60% from recliner to 3n1 to bed. A for balance and to shift hips towards sitting surface. Cues for safe positioning.  Ambulation/Gait Ambulation/Gait: No Ambulation/Gait Assistance: 1: +2 Total assist;Patient percentage (comment) (pt=65%) Ambulation/Gait Assistance Details (indicate cue type and reason): mod physical A to fully extend hips for upright standing prior to taking steps from bedside commode back to recliner; heavy dependence on UE support; noted some WBing through R forearm (on therapist's arm, which was holding gait belt) Ambulation Distance (Feet): 3 Feet Assistive device: 2 person hand held assist (except support given at  trunk/gait belt on Right) Gait Pattern: Decreased step length - right;Decreased step length - left (dependent on knee hyperext. for stance stability) Stairs: No Wheelchair Mobility Wheelchair Mobility:  No  ADL: ADL Eating/Feeding: Set up Eating/Feeding Details (indicate cue type and reason): husband setting up pt's meal when OT left Where Assessed - Eating/Feeding: Chair Grooming: Not assessed Where Assessed - Grooming: Sitting, bed;Unsupported Upper Body Bathing: Not assessed Where Assessed - Upper Body Bathing: Sitting, bed;Unsupported Lower Body Bathing: Not assessed Lower Body Bathing Details (indicate cue type and reason): Pt. best able to access feet in supine or longsitting Where Assessed - Lower Body Bathing: Supine, head of bed up;Sit to stand from bed Upper Body Dressing: Not assessed Where Assessed - Upper Body Dressing: Sitting, bed;Unsupported Lower Body Dressing: Not assessed Lower Body Dressing Details (indicate cue type and reason): Total assist to don bil. AFOs, socks, and shoes Where Assessed - Lower Body Dressing: Sit to stand from bed Toilet Transfer: Not assessed Toilet Transfer Method: Not assessed Toilet Transfer Equipment: Bedside commode Toileting - Clothing Manipulation: Not assessed Where Assessed - Toileting Clothing Manipulation: Supine, head of bed flat Toileting - Hygiene: Not assessed Where Assessed - Toileting Hygiene: Supine, head of bed flat;Rolling right and/or left Tub/Shower Transfer: Not assessed Equipment Used:  (Steady assist; BSC) ADL Comments: Attempted to transfer to [facility], however, despite multiple attempts, and problem solving through options, pt. unable to perform with max A.  Using steady A lift, was able to move pt. from sit to stand with max A, with bed height significantly elevated  Cognition: Cognition Arousal/Alertness: Awake/alert Orientation Level: Oriented X4 Cognition Arousal/Alertness: Awake/alert Overall Cognitive Status: Appears within functional limits for tasks assessed Orientation Level: Oriented X4   Blood pressure 110/60, pulse 76, temperature 98.9 F (37.2 C), temperature source Oral, resp. rate 20, height 5'  6" (1.676 m), weight 96.1 kg (211 lb 13.8 oz), SpO2 94.00%. .  Physical Exam  Constitutional: She is oriented to person, place, and time. She appears well-developed. She is obese. HENT:  Head: Normocephalic.  Eyes: Right eye exhibits no discharge.  Neck: Neck supple. No thyromegaly present.  Cardiovascular: Normal rate.  Pulmonary/Chest: Effort normal.  Abdominal: She exhibits no distension. There is no tenderness.  Musculoskeletal:  Shoulder sling to right shoulder  Neurological: She is alert and oriented to person, place, and time. A sensory deficit is present.  Reflex Scores:  Tricep reflexes are 1+ on the right side and 1+ on the left side.  Bicep reflexes are 1+ on the right side and 1+ on the left side.  Brachioradialis reflexes are 1+ on the right side and 1+ on the left side.  Patellar reflexes are 0 on the right side and 0 on the left side.  Achilles reflexes are 0 on the right side and 0 on the left side. Patient with bilateral sensory loss to pinprick and light touch growing increasingly significant from the thighs to the toes. Patient with bilateral atrophy of the distal musculature. She has bilateral foot drop. Quadriceps hamstring strength is approximately 2-3/5. Hip strength is grossly 2/5. Upper ext strength on the left side is within functional limits. Right-sided handgrip is 3-4/5. I wasn't able to test her proximal musculature due to her splint and precautions. Skin: Skin is warm and dry.  Incision dressed. Wound is clean dry and intact.   No results found for this or any previous visit (from the past 48 hour(s)). No results found.  Post Admission Physician Evaluation: 1. Functional deficits  secondary  to recent rotator cuff injury and repair. Patient with a history of Charcot-Marie-Tooth. 2. Patient is admitted to receive collaborative, interdisciplinary care between the physiatrist, rehab nursing staff, and therapy team. 3. Patient's level of medical complexity and  substantial therapy needs in context of that medical necessity cannot be provided at a lesser intensity of care such as a SNF. 4. Patient has experienced substantial functional loss from his/her baseline which was documented above under the "Functional History" and "Functional Status" headings.  Judging by the patient's diagnosis, physical exam, and functional history, the patient has potential for functional progress which will result in measurable gains while on inpatient rehab.  These gains will be of substantial and practical use upon discharge  in facilitating mobility and self-care at the household level. 5. Physiatrist will provide 24 hour management of medical needs as well as oversight of the therapy plan/treatment and provide guidance as appropriate regarding the interaction of the two. 6. 24 hour rehab nursing will assist with bladder management, bowel management, safety, skin/wound care, disease management, medication administration, pain management and patient education  and help integrate therapy concepts, techniques,education, etc. 7. PT will assess and treat for: bathing, bowel/bladder control and psychosocial adjustment.functional mobility, transfers, and care giver ed. Goals are: independent with assistive device to min assist 8. OT will assess and treat for: bathing, bowel/bladder control, dressing, toileting and transferring .  Goals are: minimal assist to mod assist. 9. SLP will assess and treat for: not app.  Goals are: N/A. 10. Case Management and Social Worker will assess and treat for psychological issues and discharge planning. 11. Team conference will be held weekly to assess progress toward goals and to determine barriers to discharge. 12.  Patient will receive at least 3 hours of therapy per day at least 5 days per week. 13. ELOS and Prognosis: 10 days  excellent   Medical Problem List and Plan: 1.Rotator cuff tear s/p open rotator cuff repair and distal clavicle  acrominectomy. Shoulder sling. Ok for pendelum exercises. 2.Charcot marie tooth-consertive care. AFO"s prior to admission. Patient may need more supportive/longer AFOs. It's probably not the most ideal time to change these given her rotator cuff injury. We'll observe gait pattern with therapy. A lot of her problems with transfers and mobility are related to her proximal sensory loss. Her strength is reasonable in her proximal legs and they should be strong enough to support her typically in gait or transfers. 3.Dvt prophylaxis-lovenox. Check platelets and follow for any signs or symptoms of bleeding complications. 4.pain management-norco,robaxin.Monitor with activity. She denies any significant pain complaints at present. 5.Htn-lasix , toprol daily.Monitor with increased activity 6.constipation-laxative assist. Followup with nursing in this regard. We'll schedule nighttime stool softener/laxative. 7.mood-ego support    ANGIULLI,DANIEL J. 07/10/2011, 12:56 PM

## 2011-07-10 NOTE — PMR Pre-admission (Signed)
  Evaluated for possible admission.  I have talked with patient and her husband.  They are hopeful for inpatient rehab admission.  I have called and faxed information to [facility].  I will await their response and then provide an update.  Pager [phone]

## 2011-07-10 NOTE — Progress Notes (Signed)
  Ms Mcclafferty is looking better today.  She is not able to sleep in her bed so she slept in the recliner.  She states that [facility] is pending due to insurance.   O: AF VSS Wound CDI, NVI in the right arm  min edema  A/P s/p R shoulder RCR [facility] rehab placement pending, if not will need [facility] SNF

## 2011-07-10 NOTE — Progress Notes (Signed)
Physical Therapy Treatment Patient Details Name: AMAL RENBARGER MRN: [id number] DOB: 1946/07/19 Today's Date: 07/10/2011  PT Assessment/Plan  PT - Assessment/Plan Comments on Treatment Session: Pt. is very motivated to progress with therapy and is hoping to go to CIR to improve mobility in order to go back home with husband. Attempted use of hemiwaker with patient but she was slightly fearful of possible lack of support it could provide her. PT Plan: Discharge plan remains appropriate PT Frequency: Min 4X/week Follow Up Recommendations: Inpatient Rehab Equipment Recommended: Defer to next venue PT Goals  Acute Rehab PT Goals PT Goal: Sit to Supine/Side - Progress: Progressing toward goal PT Transfer Goal: Bed to Chair/Chair to Bed - Progress: Progressing toward goal PT Goal: Ambulate - Progress: Other (comment) (not addressed) PT Goal: Perform Home Exercise Program - Progress: Other (comment) (not addressed)  PT Treatment Precautions/Restrictions  Precautions Precautions: Shoulder Type of Shoulder Precautions: maintain right shoulder in abducted position and NWB Right UE Precaution Comments: Sling and RUE positioned correctly in don joy sling with abductor pillow  when OT arrived.   Required Braces or Orthoses: Yes Other Brace/Splint: Don Joy Shoulder sling with Abductor pillow Restrictions Weight Bearing Restrictions: Yes RUE Weight Bearing: Non weight bearing Other Position/Activity Restrictions: NWB Right UE Mobility (including Balance) Bed Mobility Bed Mobility: Yes Sit to Supine - Right: 4: Min assist;HOB flat;With rail Sit to Supine - Right Details (indicate cue type and reason): A for correct position and LE placement. Cues to protect right shoulder Transfers Transfers: Yes Sit to Stand: 1: +2 Total assist;Patient percentage (comment);From chair/3-in-1;From elevated surface (40%) Sit to Stand Details (indicate cue type and reason): A to initiated stand as patient had been  up in recliner since 2am. Pt stood x2 from recliner and 3n1. Cues to ensure safe technique Stand to Sit: 1: +2 Total assist;Patient percentage (comment);To chair/3-in-1;To bed Stand to Sit Details: A and cues to control descent onto 3n1 and bed. Cues for safe hand placement.  Stand Pivot Transfers: 1: +2 Total assist Stand Pivot Transfer Details (indicate cue type and reason): p=60% from recliner to 3n1 to bed. A for balance and to shift hips towards sitting surface. Cues for safe positioning.  Ambulation/Gait Ambulation/Gait: No Stairs: No Wheelchair Mobility Wheelchair Mobility: No    Exercise    End of Session PT - End of Session Equipment Utilized During Treatment: Gait belt Activity Tolerance: Patient limited by fatigue;Other (comment) (Pt appears to be limited by anxiety/fear) Patient left: in bed;with call bell in reach Nurse Communication: Mobility status for transfers General Behavior During Session: [facility] for tasks performed Cognition: [facility] for tasks performed  Cassity Christian, Adline Potter 07/10/2011, 12:11 PM 07/10/2011 Fredrich Birks PTA [phone] pager [phone] office

## 2011-07-10 NOTE — Progress Notes (Signed)
Utilization review completed. Primitivo Merkey, RN, BSN. 07/10/11 

## 2011-07-10 NOTE — PMR Pre-admission (Signed)
PMR Admission Coordinator Pre-Admission Assessment  Patient:  Haley Kennedy is an 65 y.o., female MRN:  [id number] DOB:  08/09/1946 Height:  Height: 5\' 6"  (167.6 cm) Weight:  Weight: 96.1 kg (211 lb 13.8 oz) S.S.N. # [id number]  Insurance Information:  Precert 11/14 - 11/21 update 07/18/11 HMO:yes  PPO:      PCP:      IPA:      80/20:      OTHER:Medicare Advantage PRIMARY:Blue Medicare      Policy#:[id number]      Subscriber:Haley Kennedy CM Name:Cedric Chestine Spore      Phone#:[phone]     OZD#:[phone] Pre-Cert#:[id number]      Employer:Not employed Benefits:  Phone #:[phone]     Name:Simone J. Eff. Date:08-27-10     Deduct:none    Out of Pocket Max:$3400(met$1719)    Life FIE:PPIRJJOAC CIR:$170 days 1-6 w/precert      SNF:no copay days 1-10, $50 days 11-100 w/precert Outpatient: no visit limit     Co-Pay:$35/ visit Home Health:100% w/precert      Co-Pay:none DME:80% with precert >$600     Co-Pay:20% Providers:in network  Current Medical History:   Patient Admitting Diagnosis:Rotator cuff tear s/p open rotator cuff repair and distal acrominectomy.  Hx of CMT.    History of Present Illness: History of charcot-marie-tooth.  Recent fall.  Underwent right shoulder arthroscopy by Dr. Ranell Patrick 07/06/11.  Has shoulder sling and bilateral AFO braces.  Patients Past Medical History:   Past Medical History  Diagnosis Date  . Ulcer   . Hyperlipidemia   . 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 Hyperlipidemia   . Shortness of breath     with exertion;pt states its bc shes not in shape   . Asthma     as a child  . Peripheral neuropathy   . Swelling     from knee down;takes furosemide daily  . Chronic back pain   . Arthritis     low back and neck  . Gastric ulcer   . Diverticulosis   . Rotator cuff (capsule) sprain 07/09/2011   Family Medical History:  family history is not on file. NIH Stroke scale:  Height and Weight Height: 5\' 6"  (167.6 cm) Weight: 96.1 kg (211  lb 13.8 oz) BSA (Calculated - sq m): 2.12 sq meters BMI (Calculated): 34.3  Weight in (lb) to have BMI = 25: 154.6  Glascow Coma Scale:     Prior Rehab/Hospitalizations: [facility] 06/12  Medications PTA Medications:   Medications Prior to Admission  Medication Dose Route Frequency Provider Last Rate Last Dose  . acetaminophen (TYLENOL) tablet 650 mg  650 mg Oral Q6H PRN Verlee Rossetti   325 mg at 07/09/11 1359   Or  . acetaminophen (TYLENOL) suppository 650 mg  650 mg Rectal Q6H PRN Verlee Rossetti      . alum & mag hydroxide-simeth (MAALOX PLUS) 400-400-40 MG/5ML suspension 30 mL  30 mL Oral Q4H PRN Mcarthur Rossetti. Angiulli, PA      . bisacodyl (DULCOLAX) EC tablet 10 mg  10 mg Oral Daily PRN Verlee Rossetti   5 mg at 07/10/11 1019   Or  . bisacodyl (DULCOLAX) suppository 10 mg  10 mg Rectal Daily PRN Verlee Rossetti      . diphenhydrAMINE (BENADRYL) capsule 25 mg  25 mg Oral Q6H PRN Lowry Bowl Kovach   25 mg at 07/10/11 1821  . diphenhydrAMINE (BENADRYL) capsule 50  mg  50 mg Oral Once Gwinda Maine   50 mg at 07/08/11 2043  . docusate sodium (COLACE) capsule 100 mg  100 mg Oral BID Verlee Rossetti   100 mg at 07/11/11 1610  . enoxaparin (LOVENOX) injection 40 mg  40 mg Subcutaneous Q24H Verlee Rossetti   40 mg at 07/11/11 1130  . furosemide (LASIX) tablet 20 mg  20 mg Oral Daily Verlee Rossetti   20 mg at 07/08/11 0957  . guaiFENesin-dextromethorphan (ROBITUSSIN DM) 100-10 MG/5ML syrup 5-10 mL  5-10 mL Oral Q6H PRN Mcarthur Rossetti. Angiulli, PA      . HYDROcodone-acetaminophen (NORCO) 5-325 MG per tablet 1-2 tablet  1-2 tablet Oral Q4H PRN Verlee Rossetti   1 tablet at 07/10/11 1019  . HYDROmorphone (DILAUDID) injection 0.5-1 mg  0.5-1 mg Intravenous Q2H PRN Verlee Rossetti   1 mg at 07/07/11 9604  . HYDROmorphone (DILAUDID) tablet 2 mg  2 mg Oral Q3H PRN Ralene Bathe, PA   2 mg at 07/08/11 0957  . magnesium hydroxide (MILK OF MAGNESIA) suspension 30 mL  30 mL Oral Q12H PRN Verlee Rossetti       . menthol-cetylpyridinium (CEPACOL) lozenge 3 mg  1 lozenge Oral PRN Verlee Rossetti       Or  . phenol (CHLORASEPTIC) mouth spray 1 spray  1 spray Mouth/Throat PRN Verlee Rossetti      . methocarbamol (ROBAXIN) tablet 500 mg  500 mg Oral Q6H PRN Verlee Rossetti   500 mg at 07/08/11 1938   Or  . methocarbamol (ROBAXIN) 500 mg in dextrose 5 % 50 mL IVPB  500 mg Intravenous Q6H PRN Verlee Rossetti      . metoCLOPramide (REGLAN) tablet 5-10 mg  5-10 mg Oral Q8H PRN Verlee Rossetti   10 mg at 07/08/11 1343   Or  . metoCLOPramide (REGLAN) injection 5-10 mg  5-10 mg Intravenous Q8H PRN Verlee Rossetti      . metoprolol succinate (TOPROL-XL) 24 hr tablet 25 mg  25 mg Oral Daily Verlee Rossetti   25 mg at 07/11/11 5409  . ondansetron (ZOFRAN) tablet 4 mg  4 mg Oral Q6H PRN Verlee Rossetti       Or  . ondansetron [facility]) injection 4 mg  4 mg Intravenous Q6H PRN Verlee Rossetti   4 mg at 07/07/11 0004  . polyethylene glycol (MIRALAX / GLYCOLAX) packet 17 g  17 g Oral Daily PRN Verlee Rossetti      . promethazine (PHENERGAN) tablet 12.5 mg  12.5 mg Oral Q6H PRN Mcarthur Rossetti. Angiulli, PA       Or  . promethazine (PHENERGAN) suppository 12.5 mg  12.5 mg Rectal Q6H PRN Mcarthur Rossetti. Angiulli, PA       Or  . promethazine (PHENERGAN) injection 12.5 mg  12.5 mg Intramuscular Q6H PRN Mcarthur Rossetti. Angiulli, PA      . senna-docusate (Senokot-S) tablet 2 tablet  2 tablet Oral QHS Mcarthur Rossetti. Angiulli, PA      . sodium phosphate (FLEET) 7-19 GM/118ML enema 1 enema  1 enema Rectal Daily PRN Verlee Rossetti      . sorbitol 70 % solution 30 mL  30 mL Oral Q12H PRN Mcarthur Rossetti. Angiulli, PA      . vancomycin (VANCOCIN) IVPB 1000 mg/200 mL premix  1,000 mg Intravenous Once Verlee Rossetti   1,000 mg at 07/07/11 8119  . DISCONTD: 0.9 %  NaCl with KCl 20 mEq/ L  infusion   Intravenous Continuous Verlee Rossetti      . DISCONTD: acetaminophen (TYLENOL) suppository 650 mg  650 mg Rectal Q6H PRN Mcarthur Rossetti. Angiulli, PA      . DISCONTD:  acetaminophen (TYLENOL) tablet 650 mg  650 mg Oral Q6H PRN Mcarthur Rossetti. Angiulli, PA      . DISCONTD: bupivacaine-EPINEPHrine (MARCAINE W/ EPI) 0.25 % (with pres) injection    PRN Verlee Rossetti   8 mL at 07/06/11 1610  . DISCONTD: ceFAZolin (ANCEF) IVPB 2 g/50 mL premix  2 g Intravenous 60 min Pre-Op Verlee Rossetti      . DISCONTD: enoxaparin (LOVENOX) injection 40 mg  40 mg Subcutaneous Q24H Mcarthur Rossetti. Angiulli, PA      . DISCONTD: furosemide (LASIX) tablet 20 mg  20 mg Oral Daily Mcarthur Rossetti. Angiulli, PA      . DISCONTD: Grape Seed CAPS 60 mg  1 capsule Oral Daily Verlee Rossetti      . DISCONTD: HYDROcodone-acetaminophen (NORCO) 5-325 MG per tablet 1-2 tablet  1-2 tablet Oral Q4H PRN Mcarthur Rossetti. Angiulli, PA      . DISCONTD: HYDROmorphone (DILAUDID) injection 0.25-0.5 mg  0.25-0.5 mg Intravenous Q5 min PRN Kerby Nora, MD      . DISCONTD: meperidine (DEMEROL) injection 6.25-12.5 mg  6.25-12.5 mg Intravenous PRN Kerby Nora, MD      . DISCONTD: methocarbamol (ROBAXIN) 500 mg in dextrose 5 % 50 mL IVPB  500 mg Intravenous Q6H PRN Mcarthur Rossetti. Angiulli, PA      . DISCONTD: methocarbamol (ROBAXIN) tablet 500 mg  500 mg Oral Q6H PRN Mcarthur Rossetti. Angiulli, PA      . DISCONTD: morphine 2 MG/ML injection 4.8 mg  0.05 mg/kg Intravenous Q10 min PRN Kerby Nora, MD      . DISCONTD: ondansetron [facility]) injection 4 mg  4 mg Intravenous Once PRN Kerby Nora, MD      . DISCONTD: polyethylene glycol (MIRALAX / GLYCOLAX) packet 17 g  17 g Oral Daily PRN Mcarthur Rossetti. Angiulli, PA      . DISCONTD: sodium chloride 0.9 % irrigation    PRN Verlee Rossetti   3,000 mL at 07/06/11 9604  . DISCONTD: vancomycin (VANCOCIN) IVPB 1000 mg/200 mL premix  1,000 mg Intravenous 60 min Pre-Op Verlee Rossetti       Medications Prior to Admission  Medication Sig Dispense Refill  . furosemide (LASIX) 40 MG tablet Take 20 mg by mouth daily as needed. Takes at lunchtime; For lower extremity edema      . metoprolol (TOPROL-XL) 50 MG 24 hr  tablet Take 25 mg by mouth daily.         Current Medications: Current facility-administered medications:acetaminophen (TYLENOL) suppository 650 mg, 650 mg, Rectal, Q6H PRN, Verlee Rossetti;  acetaminophen (TYLENOL) tablet 650 mg, 650 mg, Oral, Q6H PRN, Verlee Rossetti, 325 mg at 07/09/11 1359;  alum & mag hydroxide-simeth (MAALOX PLUS) 400-400-40 MG/5ML suspension 30 mL, 30 mL, Oral, Q4H PRN, Mcarthur Rossetti. Angiulli, PA bisacodyl (DULCOLAX) EC tablet 10 mg, 10 mg, Oral, Daily PRN, Verlee Rossetti, 5 mg at 07/10/11 1019;  bisacodyl (DULCOLAX) suppository 10 mg, 10 mg, Rectal, Daily PRN, Verlee Rossetti;  diphenhydrAMINE (BENADRYL) capsule 25 mg, 25 mg, Oral, Q6H PRN, Gwinda Maine, 25 mg at 07/10/11 1821;  docusate sodium (COLACE) capsule 100 mg, 100 mg, Oral, BID, Verlee Rossetti, 100 mg at 07/11/11  0956 enoxaparin (LOVENOX) injection 40 mg, 40 mg, Subcutaneous, Q24H, Verlee Rossetti, 40 mg at 07/11/11 1130;  furosemide (LASIX) tablet 20 mg, 20 mg, Oral, Daily, Verlee Rossetti, 20 mg at 07/08/11 0957;  guaiFENesin-dextromethorphan (ROBITUSSIN DM) 100-10 MG/5ML syrup 5-10 mL, 5-10 mL, Oral, Q6H PRN, Mcarthur Rossetti. Angiulli, PA HYDROcodone-acetaminophen (NORCO) 5-325 MG per tablet 1-2 tablet, 1-2 tablet, Oral, Q4H PRN, Verlee Rossetti, 1 tablet at 07/10/11 1019;  HYDROmorphone (DILAUDID) injection 0.5-1 mg, 0.5-1 mg, Intravenous, Q2H PRN, Verlee Rossetti, 1 mg at 07/07/11 1610;  HYDROmorphone (DILAUDID) tablet 2 mg, 2 mg, Oral, Q3H PRN, Ralene Bathe, PA, 2 mg at 07/08/11 0957;  magnesium hydroxide (MILK OF MAGNESIA) suspension 30 mL, 30 mL, Oral, Q12H PRN, Verlee Rossetti menthol-cetylpyridinium (CEPACOL) lozenge 3 mg, 1 lozenge, Oral, PRN, Verlee Rossetti;  methocarbamol (ROBAXIN) 500 mg in dextrose 5 % 50 mL IVPB, 500 mg, Intravenous, Q6H PRN, Verlee Rossetti;  methocarbamol (ROBAXIN) tablet 500 mg, 500 mg, Oral, Q6H PRN, Verlee Rossetti, 500 mg at 07/08/11 1938;  metoCLOPramide (REGLAN) injection 5-10 mg, 5-10 mg,  Intravenous, Q8H PRN, Verlee Rossetti metoCLOPramide (REGLAN) tablet 5-10 mg, 5-10 mg, Oral, Q8H PRN, Verlee Rossetti, 10 mg at 07/08/11 1343;  metoprolol succinate (TOPROL-XL) 24 hr tablet 25 mg, 25 mg, Oral, Daily, Verlee Rossetti, 25 mg at 07/11/11 0956;  ondansetron [facility]) injection 4 mg, 4 mg, Intravenous, Q6H PRN, Verlee Rossetti, 4 mg at 07/07/11 0004;  ondansetron (ZOFRAN) tablet 4 mg, 4 mg, Oral, Q6H PRN, Verlee Rossetti phenol (CHLORASEPTIC) mouth spray 1 spray, 1 spray, Mouth/Throat, PRN, Verlee Rossetti;  polyethylene glycol (MIRALAX / GLYCOLAX) packet 17 g, 17 g, Oral, Daily PRN, Verlee Rossetti;  promethazine (PHENERGAN) injection 12.5 mg, 12.5 mg, Intramuscular, Q6H PRN, Mcarthur Rossetti. Angiulli, PA;  promethazine (PHENERGAN) suppository 12.5 mg, 12.5 mg, Rectal, Q6H PRN, Mcarthur Rossetti. Angiulli, PA promethazine (PHENERGAN) tablet 12.5 mg, 12.5 mg, Oral, Q6H PRN, Mcarthur Rossetti. Angiulli, PA;  senna-docusate (Senokot-S) tablet 2 tablet, 2 tablet, Oral, QHS, Mcarthur Rossetti. Angiulli, PA;  sodium phosphate (FLEET) 7-19 GM/118ML enema 1 enema, 1 enema, Rectal, Daily PRN, Verlee Rossetti;  sorbitol 70 % solution 30 mL, 30 mL, Oral, Q12H PRN, Mcarthur Rossetti. Angiulli, PA DISCONTD: acetaminophen (TYLENOL) suppository 650 mg, 650 mg, Rectal, Q6H PRN, Mcarthur Rossetti. Angiulli, PA;  DISCONTD: acetaminophen (TYLENOL) tablet 650 mg, 650 mg, Oral, Q6H PRN, Mcarthur Rossetti. Angiulli, PA;  DISCONTD: enoxaparin (LOVENOX) injection 40 mg, 40 mg, Subcutaneous, Q24H, Mcarthur Rossetti. Angiulli, PA;  DISCONTD: furosemide (LASIX) tablet 20 mg, 20 mg, Oral, Daily, Mcarthur Rossetti. Angiulli, PA DISCONTD: HYDROcodone-acetaminophen (NORCO) 5-325 MG per tablet 1-2 tablet, 1-2 tablet, Oral, Q4H PRN, Mcarthur Rossetti. Angiulli, PA;  DISCONTD: methocarbamol (ROBAXIN) 500 mg in dextrose 5 % 50 mL IVPB, 500 mg, Intravenous, Q6H PRN, Mcarthur Rossetti. Angiulli, PA;  DISCONTD: methocarbamol (ROBAXIN) tablet 500 mg, 500 mg, Oral, Q6H PRN, Mcarthur Rossetti. Angiulli, PA DISCONTD: polyethylene glycol  (MIRALAX / GLYCOLAX) packet 17 g, 17 g, Oral, Daily PRN, Mcarthur Rossetti. Angiulli, PA  Additional Precautions/Restrictions: Precautions Precautions: Shoulder Type of Shoulder Precautions: maintain right shoulder in abducted position and NWB Right UE Precaution Comments: Sling and RUE positioned correctly in don joy sling with abductor pillow  when OT arrived.   Required Braces or Orthoses: Yes Other Brace/Splint: Don Joy Shoulder sling with Abductor pillow Restrictions Weight Bearing Restrictions: Yes RUE Weight Bearing: Non weight bearing Other Position/Activity Restrictions: NWB Right UE  Therapy Assessments Cognition  Arousal/Alertness: Awake/alert Overall Cognitive Status: Appears within functional limits for tasks assessed Orientation Level: Oriented X4 Cognition Arousal/Alertness: Awake/alert Orientation Level: Oriented X4 Home Living Lives With: Spouse Receives Help From: Family Type of Home: House Home Layout: One level Home Access: Ramped entrance Bathroom Shower/Tub: Health visitor: Handicapped height Bathroom Accessibility: Yes How Accessible: Accessible via wheelchair Home Adaptive Equipment: Wheelchair - manual;Other (comment);Walker - rolling Prior Function Level of Independence: Needs assistance with ADLs;Needs assistance with homemaking Bath: Moderate (Mod I prior to fall 9/12) Toileting: Minimal (modified I prior to fall 9/12) Dressing: Moderate (Modified I prior to fall 9/12) Meal Prep: Maximal (Modified I prior to fall 9/12) Light Housekeeping: Maximal (Modified I prior to fall 9/12) Driving: Yes (prior to fall) Vocation: 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 Movements are Fluid and Coordinated: No (weakness, dependent on momentum) Coordination and Movement Description: dependent on momentum for transfers/gross movement  ADLs/Mobility: ADL Eating/Feeding: Set up Eating/Feeding Details (indicate cue type and  reason): husband setting up pt's meal when OT left Where Assessed - Eating/Feeding: Chair Grooming: Not assessed Where Assessed - Grooming: Sitting, bed;Unsupported Upper Body Bathing: Not assessed Where Assessed - Upper Body Bathing: Sitting, bed;Unsupported Lower Body Bathing: Not assessed Lower Body Bathing Details (indicate cue type and reason): Pt. best able to access feet in supine or longsitting Where Assessed - Lower Body Bathing: Supine, head of bed up;Sit to stand from bed Upper Body Dressing: Not assessed Where Assessed - Upper Body Dressing: Sitting, bed;Unsupported Lower Body Dressing: Not assessed Lower Body Dressing Details (indicate cue type and reason): Total assist to don bil. AFOs, socks, and shoes Where Assessed - Lower Body Dressing: Sit to stand from bed Toilet Transfer: Not assessed Toilet Transfer Method: Not assessed Toilet Transfer Equipment: Bedside commode Toileting - Clothing Manipulation: Not assessed Where Assessed - Toileting Clothing Manipulation: Supine, head of bed flat Toileting - Hygiene: Not assessed Where Assessed - Toileting Hygiene: Supine, head of bed flat;Rolling right and/or left Tub/Shower Transfer: Not assessed Equipment Used:  (Steady assist; BSC) ADL Comments: Attempted to transfer to [facility], however, despite multiple attempts, and problem solving through options, pt. unable to perform with max A.  Using steady A lift, was able to move pt. from sit to stand with max A, with bed height significantly elevated  Bed Mobility Bed Mobility: Yes Rolling Right: Not tested (comment) Rolling Left: Not tested (comment) Supine to Sit: Not tested (comment) Supine to Sit Details (indicate cue type and reason): cues to scoot fully to eob; short reciprocal scoots Sitting - Scoot to Edge of Bed: 5: Supervision;Other (comment) (scooting to edge of chair for exercises) Sit to Supine - Right: 4: Min assist;HOB flat;With rail Sit to Supine - Right Details  (indicate cue type and reason): A for correct position and LE placement. Cues to protect right shoulder Transfers Transfers: Yes Sit to Stand: 1: +2 Total assist;Patient percentage (comment);From chair/3-in-1;From elevated surface (40%) Sit to Stand Details (indicate cue type and reason): A to initiated stand as patient had been up in recliner since 2am. Pt stood x2 from recliner and 3n1. Cues to ensure safe technique Stand to Sit: 1: +2 Total assist;Patient percentage (comment);To chair/3-in-1;To bed Stand to Sit Details: A and cues to control descent onto 3n1 and bed. Cues for safe hand placement.  Stand Pivot Transfers: 1: +2 Total assist Stand Pivot Transfer Details (indicate cue type and reason): p=60% from recliner to 3n1 to bed. A for balance and to shift hips  towards sitting surface. Cues for safe positioning.  Ambulation/Gait Ambulation/Gait: No Ambulation/Gait Assistance: 1: +2 Total assist;Patient percentage (comment) (pt=65%) Ambulation/Gait Assistance Details (indicate cue type and reason): mod physical A to fully extend hips for upright standing prior to taking steps from bedside commode back to recliner; heavy dependence on UE support; noted some WBing through R forearm (on therapist's arm, which was holding gait belt) Ambulation Distance (Feet): 3 Feet Assistive device: 2 person hand held assist (except support given at trunk/gait belt on Right) Gait Pattern: Decreased step length - right;Decreased step length - left (dependent on knee hyperext. for stance stability) Stairs: No Wheelchair Mobility Wheelchair Mobility: No  Social/Family/Support Systems:  Social/Family/Support Systems Patient Roles: Spouse Contact Information:  (Husband cell ( [phone])) Anticipated Caregiver:  (spouse) Anticipated Caregiver's Contact Information: see above Ability/Limitations of Caregiver: husband can assist Caregiver Availability: 24/7 Discharge Plan Discussed with Primary Caregiver:  Yes Is Caregiver In Agreement with Plan?: Yes Does Caregiver/Family have Issues with Lodging/Transportation while Pt is in Rehab?: No Goals/Additional Needs:  Goals/Additional Needs Patient/Family Goal for Rehab: PT min A, OT min/mod A, Cultural Considerations: [facility] Dietary Needs: none Equipment Needs: TBD Pt/Family Agrees to Admission and willing to participate: Yes Program Orientation Provided & Reviewed with Pt/Caregiver Including Roles  & Responsibilities: Yes  Preadmission Screen Completed By:  Trish Mage, 07/11/2011 2:42 PM  Patient's condition:  This patient's condition remains as documented in the Consult dated 07/09/11, in which the Rehabilitation Physician determined and documented that the patient's condition is appropriate for intensive rehabilitative care in an inpatient rehabilitation facility.  Preadmission Screen Competed UJ:WJXBJ Koleen Distance, RN, Time/Date,1428/ 07/10/11.  Admission Coordinator:  Trish Mage, time1441/Date11/14/12  . [phone]

## 2011-07-11 ENCOUNTER — Inpatient Hospital Stay (HOSPITAL_COMMUNITY)
Admission: RE | Admit: 2011-07-11 | Discharge: 2011-07-17 | DRG: 945 | Disposition: A | Discharge status: Home Health | Payer: Medicare Other | Source: Ambulatory Visit | Attending: Physical Medicine & Rehabilitation | Admitting: Physical Medicine & Rehabilitation

## 2011-07-11 DIAGNOSIS — I10 Essential (primary) hypertension: Secondary | ICD-10-CM | POA: Diagnosis present

## 2011-07-11 DIAGNOSIS — M7530 Calcific tendinitis of unspecified shoulder: Secondary | ICD-10-CM

## 2011-07-11 DIAGNOSIS — M47817 Spondylosis without myelopathy or radiculopathy, lumbosacral region: Secondary | ICD-10-CM | POA: Diagnosis present

## 2011-07-11 DIAGNOSIS — D62 Acute posthemorrhagic anemia: Secondary | ICD-10-CM

## 2011-07-11 DIAGNOSIS — S43429A Sprain of unspecified rotator cuff capsule, initial encounter: Secondary | ICD-10-CM

## 2011-07-11 DIAGNOSIS — K5900 Constipation, unspecified: Secondary | ICD-10-CM | POA: Diagnosis present

## 2011-07-11 DIAGNOSIS — G609 Hereditary and idiopathic neuropathy, unspecified: Secondary | ICD-10-CM | POA: Diagnosis present

## 2011-07-11 DIAGNOSIS — M75101 Unspecified rotator cuff tear or rupture of right shoulder, not specified as traumatic: Secondary | ICD-10-CM

## 2011-07-11 DIAGNOSIS — M47812 Spondylosis without myelopathy or radiculopathy, cervical region: Secondary | ICD-10-CM | POA: Diagnosis present

## 2011-07-11 DIAGNOSIS — E785 Hyperlipidemia, unspecified: Secondary | ICD-10-CM | POA: Diagnosis present

## 2011-07-11 DIAGNOSIS — M719 Bursopathy, unspecified: Secondary | ICD-10-CM | POA: Diagnosis present

## 2011-07-11 DIAGNOSIS — M67919 Unspecified disorder of synovium and tendon, unspecified shoulder: Secondary | ICD-10-CM | POA: Diagnosis present

## 2011-07-11 DIAGNOSIS — Z5189 Encounter for other specified aftercare: Principal | ICD-10-CM

## 2011-07-11 DIAGNOSIS — G600 Hereditary motor and sensory neuropathy: Secondary | ICD-10-CM | POA: Diagnosis present

## 2011-07-11 LAB — DIFFERENTIAL
Eosinophils Absolute: 0.6 10*3/uL (ref 0.0–0.7)
Eosinophils Relative: 8 % — ABNORMAL HIGH (ref 0–5)
Lymphocytes Relative: 43 % (ref 12–46)
Lymphs Abs: 3 10*3/uL (ref 0.7–4.0)
Monocytes Absolute: 0.5 10*3/uL (ref 0.1–1.0)

## 2011-07-11 LAB — COMPREHENSIVE METABOLIC PANEL
ALT: 59 U/L — ABNORMAL HIGH (ref 0–35)
AST: 27 U/L (ref 0–37)
Alkaline Phosphatase: 59 U/L (ref 39–117)
CO2: 27 mEq/L (ref 19–32)
Calcium: 9.1 mg/dL (ref 8.4–10.5)
Potassium: 3.9 mEq/L (ref 3.5–5.1)
Sodium: 139 mEq/L (ref 135–145)
Total Protein: 6.5 g/dL (ref 6.0–8.3)

## 2011-07-11 LAB — CBC
HCT: 37.3 % (ref 36.0–46.0)
Hemoglobin: 12.6 g/dL (ref 12.0–15.0)
MCV: 89.4 fL (ref 78.0–100.0)
RBC: 4.17 MIL/uL (ref 3.87–5.11)
WBC: 8.1 10*3/uL (ref 4.0–10.5)

## 2011-07-11 LAB — BASIC METABOLIC PANEL
CO2: 25 mEq/L (ref 19–32)
Chloride: 99 mEq/L (ref 96–112)
Sodium: 135 mEq/L (ref 135–145)

## 2011-07-11 MED ORDER — METOPROLOL SUCCINATE ER 25 MG PO TB24
25.0000 mg | ORAL_TABLET | Freq: Every day | ORAL | Status: DC
  Administered 2011-07-12 – 2011-07-17 (×6): 25 mg via ORAL
  Filled 2011-07-11 (×8): qty 1

## 2011-07-11 MED ORDER — DIPHENHYDRAMINE HCL 25 MG PO CAPS
25.0000 mg | ORAL_CAPSULE | Freq: Four times a day (QID) | ORAL | Status: DC | PRN
  Administered 2011-07-11 – 2011-07-15 (×4): 25 mg via ORAL
  Filled 2011-07-11 (×4): qty 1

## 2011-07-11 MED ORDER — OXYCODONE HCL 5 MG PO TABS: 5.0000 mg | ORAL_TABLET | ORAL | Status: DC | PRN

## 2011-07-11 MED ORDER — CAMPHOR-MENTHOL 0.5-0.5 % EX LOTN
TOPICAL_LOTION | CUTANEOUS | Status: DC | PRN
  Filled 2011-07-11: qty 222

## 2011-07-11 NOTE — Discharge Summary (Signed)
NAME:  Haley Kennedy, INTRIAGO NO.:  [id number]  MEDICAL RECORD NO.:  [id number]  LOCATION:  5008                         FACILITY:  [facility]  PHYSICIAN:  Almedia Balls. Ranell Patrick, M.D. DATE OF BIRTH:  1946-04-18  DATE OF ADMISSION:  07/06/2011 DATE OF DISCHARGE:  07/11/2011                              DISCHARGE SUMMARY   ADMITTING DIAGNOSIS:  Right shoulder pain secondary to rotator cuff tear.  DISCHARGE DIAGNOSIS:  Right shoulder pain secondary to rotator cuff tear status post rotator cuff repair.  BRIEF HISTORY:  The patient is a 65 year old female with a worsening right shoulder pain and weakness due to a rotator cuff tear of the right shoulder.  The patient does use a rolling walker for ambulation, thus elects for surgery for that right shoulder to increase her strength and decrease her pain to that right shoulder so she can walk more effectively.  PROCEDURE:  The patient had a right shoulder arthroscopy, rotator cuff repair and open distal clavicle excision by Dr. Malon Kindle on July 06, 2011.  Assistant Standley Dakins PA-C, general seizure was used, no complications.  HOSPITAL COURSE:  The patient was admitted on July 06, 2011, for the above-stated procedure, which she tolerated well.  After adequate time in postanesthesia care unit, she was transferred up to 5000.  Postop day 1, the patient complained of only minimal pain to the right shoulder. Neurovascularly she was intact distally.  Cap refill less than 2 seconds.  She was able to work with Physical Therapy and Occupational Therapy quite well, and it was decided that she would be a good candidate for either inpatient rehab versus skilled nursing facility due to the patient having very limited mobility always using a walker and unfortunately cannot use a walker with this rotator cuff repair for at least another 2 months, thus, inpatient rehab would make the most sense. Currently, we are awaiting insurance  verification of that.  We will see the patient daily until that occurs.  DISCHARGE PLAN:  The patient will be discharged to a skilled nursing facility or inpatient rehab on July 11, 2011.  CONDITION:  Stable.  DIET:  Regular.  DISCHARGE MEDICATIONS: 1. Lasix 40 mg 1-1/2 tablet daily. 2. Metoprolol 25 mg daily. 3. Omega fatty acid. 4. Robaxin 500 mg p.o. q.6 hours.  FOLLOWUP:  Follow up in 2 weeks with Dr. Malon Kindle.     Anuj Summons B. Freddi Schrager, P.A.   ______________________________ Almedia Balls. Ranell Patrick, M.D.    TBD/MEDQ  D:  07/11/2011  T:  07/11/2011  Job:  [id number]

## 2011-07-11 NOTE — PMR Pre-admission (Signed)
I am waiting on insurance case manager for inpatient rehab approval.  I should hear something later this morning.  Hopeful for inpatient rehab admit later today.  Pager [phone]

## 2011-07-11 NOTE — Progress Notes (Signed)
Patient sitting up comfortably with only mild soreness to right shoulder. Currently awaiting information regarding insurance and either SNF or CIR placement. Pt states gentle physical therapy has been going well in the hospital.  Vital signs stable. TMax stable Right shoulder: incision healing appropriately, no erythema, no drainage, neurovascularly intact distally A/P: Status post right shoulder arthroscopy and rotator cuff repair Will plan for D/C to either SNF or CIR as soon as possible.  Will have all orders and discharge summaries ready for d/c.  Pt to follow up in the office in 2 weeks.

## 2011-07-11 NOTE — PMR Pre-admission (Signed)
  I now have authorization and can admit to inpatient rehab today.  Patient and husband are agreeable.  Pager [phone]

## 2011-07-11 NOTE — Plan of Care (Signed)
Overall Plan of Care [facility]) Patient Details Name: Haley Kennedy MRN: [id number] DOB: [birth year]  Diagnosis:  Right rotator cuff injury status post rotator cuff repair.  Primary Diagnosis:    <principal problem not specified> Co-morbidities: Charcot-Marie-Tooth disease  Functional Problem List  Patient demonstrates impairments in the following areas: Balance, Bladder, Bowel, Edema, Endurance, Medication Management, Motor, Pain, Safety, Sensory  and Skin Integrity  Basic ADL's: grooming, bathing, dressing and toileting Advanced ADL's: simple meal preparation  Transfers:  bed mobility, bed to chair, car and furniture Locomotion:  ambulation, wheelchair mobility and stairs  Additional Impairments:  Functional use of upper extremity  Anticipated Outcomes Item Anticipated Outcome  Eating/Swallowing    Basic self-care  Minimal-Moderate Assistance  Tolieting  Moderate Assistance  Bowel/Bladder  Continent bowel bladder mod I  Transfers  minA   Locomotion  Mod I w/c mobility, pregait  Communication    Cognition    Pain  <3  Safety/Judgment  supervision  Other     Therapy Plan:  2-3 x day       Team Interventions: Item RN PT OT SLP SW TR Other  Self Care/Advanced ADL Retraining   x      Neuromuscular Re-Education  x x      Therapeutic Activities  x x      UE/LE Strength Training/ROM  x x      UE/LE Coordination Activities  x x      Visual/Perceptual Remediation/Compensation         DME/Adaptive Equipment Instruction  x x      Therapeutic Exercise  x x      Balance/Vestibular Training  x x      Patient/Family Education x x x      Cognitive Remediation/Compensation         Functional Mobility Training  x x      Ambulation/Gait Training  x x      Stair Training  x       Wheelchair Propulsion/Positioning  x x      Functional Tourist information centre manager Reintegration  x x      Dysphagia/Aspiration Musician         Bladder Management x        Bowel Management x        Disease Management/Prevention x        Pain Management x x x      Medication Management x        Skin Care/Wound Management x x x      Splinting/Orthotics  x x      Discharge Planning     x    Psychosocial Support     x                       Team Discharge Planning: Destination:  Home Projected Follow-up:  PT, OT Home Health Projected Equipment Needs:  Sliding Board? Tub transfer bench VS shower seat VS 3in1 for showering Patient/family involved in discharge planning:  Yes  MD ELOS: 7-10 days Medical Rehab Prognosis:  Good Assessment: Patient is admitted to the inpatient rehabilitation unit for compress of inpatient rehabilitation. PT will assess and treat the patient least one to 2 hours a day at least 5 days a week focusing on functional mobility safety adaptive techniques and equipment and caregiver education with goals primarily at a wheelchair  level at mid assist for transfers modified independent with wheelchair. Therapy will work on pre-gait strategies as well. OT will assess and treat the patient least one to 2 hours daily spotting this week focusing on everyday living activities compensatory strategies separate from a strength transfer techniques daily patient education with goals minimal assist to moderate assistance for basic self-care and hygiene. Patient is quite motivated and husband is involved in her care.

## 2011-07-11 NOTE — Progress Notes (Addendum)
Pt admitted to unit from 5000 at 1630. Pt alert and oriented and in good spirits. Oriented to room, rehab unit, and call bell system. Pt oriented to safety plan. Bed alarm activated. No complaints of pain. Pt resting in room. Continue with plan of care.

## 2011-07-12 DIAGNOSIS — M75101 Unspecified rotator cuff tear or rupture of right shoulder, not specified as traumatic: Secondary | ICD-10-CM

## 2011-07-12 LAB — COMPREHENSIVE METABOLIC PANEL
Alkaline Phosphatase: 57 U/L (ref 39–117)
BUN: 11 mg/dL (ref 6–23)
Calcium: 9.2 mg/dL (ref 8.4–10.5)
GFR calc Af Amer: >90 mL/min (ref >90)
Glucose, Bld: 91 mg/dL (ref 70–99)
Potassium: 4.1 mEq/L (ref 3.5–5.1)
Total Protein: 6.6 g/dL (ref 6.0–8.3)

## 2011-07-12 LAB — CBC
HCT: 33.8 % — ABNORMAL LOW (ref 36.0–46.0)
Hemoglobin: 11.2 g/dL — ABNORMAL LOW (ref 12.0–15.0)
MCH: 29.9 pg (ref 26.0–34.0)
MCHC: 33.1 g/dL (ref 30.0–36.0)

## 2011-07-12 LAB — DIFFERENTIAL
Eosinophils Absolute: 0.6 10*3/uL (ref 0.0–0.7)
Lymphs Abs: 2.5 10*3/uL (ref 0.7–4.0)
Neutrophils Relative %: 45 % (ref 43–77)

## 2011-07-12 MED ORDER — SENNOSIDES-DOCUSATE SODIUM 8.6-50 MG PO TABS
2.0000 | ORAL_TABLET | Freq: Every day | ORAL | Status: DC
  Administered 2011-07-12 – 2011-07-14 (×3): 2 via ORAL
  Filled 2011-07-12 (×3): qty 2

## 2011-07-12 MED ORDER — ACETAMINOPHEN 325 MG PO TABS
650.0000 mg | ORAL_TABLET | Freq: Four times a day (QID) | ORAL | Status: DC | PRN
  Administered 2011-07-13 – 2011-07-16 (×6): 650 mg via ORAL
  Filled 2011-07-12 (×6): qty 2

## 2011-07-12 MED ORDER — ALPRAZOLAM 0.25 MG PO TABS
0.2500 mg | ORAL_TABLET | Freq: Three times a day (TID) | ORAL | Status: DC | PRN
  Administered 2011-07-12: 0.25 mg via ORAL
  Filled 2011-07-12 (×2): qty 1

## 2011-07-12 MED ORDER — METHOCARBAMOL 500 MG PO TABS
500.0000 mg | ORAL_TABLET | Freq: Four times a day (QID) | ORAL | Status: DC | PRN
  Administered 2011-07-12: 500 mg via ORAL
  Filled 2011-07-12 (×2): qty 1

## 2011-07-12 MED ORDER — POLYETHYLENE GLYCOL 3350 17 G PO PACK
17.0000 g | PACK | Freq: Every day | ORAL | Status: DC | PRN
  Filled 2011-07-12: qty 1

## 2011-07-12 MED ORDER — PROMETHAZINE HCL 12.5 MG PO TABS
12.5000 mg | ORAL_TABLET | Freq: Four times a day (QID) | ORAL | Status: DC | PRN
  Administered 2011-07-17: 12.5 mg via ORAL

## 2011-07-12 MED ORDER — FUROSEMIDE 20 MG PO TABS
20.0000 mg | ORAL_TABLET | Freq: Every day | ORAL | Status: DC
  Administered 2011-07-12 – 2011-07-16 (×4): 20 mg via ORAL
  Filled 2011-07-12 (×7): qty 1

## 2011-07-12 MED ORDER — ENOXAPARIN SODIUM 40 MG/0.4ML ~~LOC~~ SOLN
40.0000 mg | SUBCUTANEOUS | Status: DC
  Administered 2011-07-12 – 2011-07-16 (×5): 40 mg via SUBCUTANEOUS
  Filled 2011-07-12 (×7): qty 0.4

## 2011-07-12 NOTE — Progress Notes (Signed)
Physical Therapy Assessment and Plan & Treatment Note  Patient Details  Name: Haley Kennedy MRN: [id number] Date of Birth: 10/24/1945 [phone] (60 min) Individual therapy, initial PT eval  PT Diagnosis: Abnormal posture, Abnormality of gait, Difficulty walking, Edema, Impaired sensation, Muscle weakness and Pain in joint Rehab Potential: Good ELOS: 2-3 weeks   Assessment & Plan Clinical Impression: Patient is a 65 y.o. year old female with recent admission to the hospital on 07/04/11 s/p fall with right shoulder pain with a history of Charcot-Marie-Tooth. She has had multiple bilateral foot surgeries as well as back surgery in the past. Patient underwent right shoulder arthroscopy with open rotator cuff repair and distal clavicle acrominectomy per Dr. Devonne Doughty on 11 9. She was placed in a shoulder sling with limited weight bearing precautions and pendulum exercises. She wears bilateral AFO braces for her history of Charcot-Marie-Tooth. She is currently total assist for activities of daily living due to her right shoulder surgery. Patient transferred to CIR on 07/11/2011 .  Patient's past medical history is significant for Charcot-Marie-Tooth, asthma, multiple back and foot surgeries, and edema in legs.    Patient currently requires mod/ max assist with mobility secondary to muscle weakness and muscle joint tightness, decreased cardiorespiratoy endurance, unbalanced muscle activation and decreased coordination and decreased standing balance, decreased postural control, decreased balance strategies and difficulty maintaining precautions.  Prior to hospitalization, patient was lived with Spouse in a 1 level House.  Home access is  Ramped entrance. Pt was using a RW and manual w/c prior to admission and is already in the process of having a power chair ordered with a chair lift built per report. She is to receive the loaner chair upon d/c from CIR.  Patient will benefit from skilled PT intervention to  maximize safe functional mobility, minimize fall risk and decrease caregiver burden for planned discharge home with 24 hour assist.  Anticipate patient will benefit from follow up [facility] at discharge.  PT - End of Session Activity Tolerance: Tolerates 30+ min activity with multiple rests PT Assessment Rehab Potential: Good Barriers to Discharge: None PT Plan PT Frequency: 2-3 X/day, 60-90 minutes Estimated Length of Stay: 2-3 weeks PT Treatment/Interventions: Ambulation/gait training;Balance/vestibular training;Community reintegration;DME/adaptive equipment instruction;Functional mobility training;Neuromuscular re-education;Pain management;Patient/family education;Splinting/orthotics;Stair training;Therapeutic Activities;Therapeutic Exercise;UE/LE Strength taining/ROM;UE/LE Coordination activities;Wheelchair propulsion/positioning  Precautions/Restrictions Precautions Precautions: Shoulder Type of Shoulder Precautions: NWB right UE Precaution Comments: Right UE in sling and abduction position Required Braces or Orthoses: Yes Other Brace/Splint: Don Joy Shoulder sling with Abductor pillow Restrictions Weight Bearing Restrictions: Yes RUE Weight Bearing: Non weight bearing Other Position/Activity Restrictions: NWB right UE   Pain Pain Assessment Pain Assessment: No/denies pain Pain Score: 0-No pain Home Living/Prior Functioning Home Living Lives With: Spouse Receives Help From: Family Type of Home: House Home Layout: One level Home Access: Ramped entrance Bathroom Shower/Tub: Walk-in Stage manager: Handicapped height Bathroom Accessibility: Yes How Accessible: Accessible via wheelchair;Accessible via walker Home Adaptive Equipment: Bedside commode/3-in-1;Grab bars around toilet;Grab bars in shower;Hand-held shower hose;Walker - rolling;Wheelchair - manual (will be getting a power chair after d/c) Prior Function Level of Independence: Needs assistance with ADLs;Needs  assistance with homemaking;Needs assistance with gait;Needs assistance with tranfers Driving: Yes (prior to fall) Vision/Perception  Vision - History Baseline Vision: Wears glasses for distance only Patient Visual Report: No change from baseline Vision - Assessment Eye Alignment: Within Functional Limits Vision Assessment: Vision not tested Perception Perception: Within Functional Limits  Cognition Overall Cognitive Status: Appears within functional limits for tasks assessed Arousal/Alertness:  Awake/alert Orientation Level: Oriented X4 Safety/Judgment:  (decreased safety with mobility) Sensation Sensation Light Touch: Appears Intact Stereognosis: Appears Intact Hot/Cold: Appears Intact Proprioception: Appears Intact Additional Comments: Appears intact for bilateral UEs Coordination Gross Motor Movements are Fluid and Coordinated: Not tested Fine Motor Movements are Fluid and Coordinated: Yes Motor  Motor Motor: Abnormal postural alignment and control (significant lower extremity weakness/hips, bilat foot drop) Motor - Skilled Clinical Observations: hyperextension in RLE noted in standing  Mobility Bed Mobility Supine to Sit: 3: Mod assist Sitting - Scoot to Edge of Bed: 5: Supervision Sit to Supine - Right: 4: Min assist;HOB flat;With rail Transfers Transfers: Yes Sit to Stand: 3: Mod assist;From elevated surface;From bed;With upper extremity assist;From chair/3-in-1 Stand to Sit: 3: Mod assist Stand Pivot Transfers: 3: Mod assist Stand Pivot Transfer Details: Verbal cues for precautions/safety;Verbal cues for technique;Visual cues/gestures for precautions/safety Lateral/Scoot Transfers: 3: Mod assist;1: +2 Total assist (+2 to hold w/c for safety) Locomotion  Ambulation Ambulation: No Ambulation/Gait Assistance: Not tested (comment) (pregait at eval) Stairs / Additional Locomotion Stairs: No (unsafe to attempt at this time -pregait only) Engineer, manufacturing systems: Yes Wheelchair Assistance: 1: +1 Total assist (pt reports using BLEs to propel w/c at home, unable at eval)  Trunk/Postural Assessment  Cervical Assessment Cervical Assessment: Within Functional Limits Thoracic Assessment Thoracic Assessment: Within Functional Limits (kyphosis in standing) Lumbar Assessment Lumbar Assessment: Within Functional Limits Postural Control Postural Control: Deficits on evaluation Postural Limitations: decreased ability to maintain upright posture due to WB status and weakness in BLEs, hyperextension in R knee in standing, decreased ability to control movements, tends to use large movenments using momentum for mobility  Balance Balance Balance Assessed: Yes Static Sitting Balance Static Sitting - Level of Assistance: 5: Stand by assistance Dynamic Sitting Balance Dynamic Sitting - Level of Assistance: 5: Stand by assistance Dynamic Standing Balance Dynamic Standing - Level of Assistance: 4: Min assist (assessed during transfer and hygeine with UE support) Extremity Assessment  RUE Assessment RUE Assessment: Not tested (in Don Joy Sling) LUE Assessment LUE Assessment: Within Functional Limits (for ADLs) RLE Strength RLE Overall Strength: Due to premorbid status;Deficits RLE Overall Strength Comments: groslly 3/5 knee, 3-/5 hip, foot drop with AFO  LLE Assessment LLE Assessment: Exceptions to [facility] LLE Strength LLE Overall Strength: Deficits;Due to premorbid status LLE Overall Strength Comments: grossly 3/5 knee, 3-/5 hip, foot drop with AFO  Recommendations for other services: Other: none  Discharge Criteria: Patient will be discharged from PT if patient refuses treatment 3 consecutive times without medical reason, if treatment goals not met, if there is a change in medical status, if patient makes no progress towards goals or if patient is discharged from hospital.  The above assessment, treatment plan, treatment alternatives and goals were  discussed and mutually agreed upon: by family  Treatment initiated with focus on problem solving various transfer methods, intro to rehab gym and equipment available, education on use of slideboard, and functional sit to stands for hygiene and toileting.  Karolee Stamps [facility] 07/12/2011, 10:41 AM

## 2011-07-12 NOTE — Progress Notes (Signed)
At 2230 11/14, pt c/o severe itching to R hand below thumb and down toward wrist, stating "it feels like something bit me". Noted redness and several small welts to this area. Pt stated itching was "driving me crazy". Called NP on call and received orders for sarna anti-itch lotion and benadryl PO PRN. Pt reported relief with these interventions. Pt slept on and off through night, reported some discomfort with positioning in bed, but denied pain. Continue with plan of care.

## 2011-07-12 NOTE — Plan of Care (Signed)
Problem: RH SKIN INTEGRITY Goal: RH STG SKIN FREE OF INFECTION/BREAKDOWN Pt will be able to assess skin for breakdown with min assist of caregiver Goal: RH STG MAINTAIN SKIN INTEGRITY WITH ASSISTANCE For shoulder incision care and inspection  Problem: RH PAIN MANAGEMENT Goal: RH STG PAIN MANAGED AT OR BELOW PT'S PAIN GOAL Patient's pain goal will be less than 2

## 2011-07-12 NOTE — Progress Notes (Signed)
Social Work  Met with pt to provide support, pt upset and very emotional. She reports having much going on with surgery, suppose to move, holidays coming up, etc. She reports she knows reason she is here and agrees with being here.  She just doesn't want to be here long and her husband has been assisting her Before surgery, since she fell in Sept.  Pt to inform her therapists of this and will go from here. She needed to ventilate and verbalize her Concerns. Will continue to follow and provide support to pt and assist her with her coping.

## 2011-07-12 NOTE — Progress Notes (Signed)
Physical TherapyTreatment Note  Patient Details  Name: Haley Kennedy MRN: [id number] Date of Birth: 01-May-1946 Today's Date: 07/12/2011  1415-1445,individual therapy, no c/o pain, just "feel like my nerves are shot because I haven't slept". Discussed transfer situations in pt's home set up and pt only planning to use power chair/lift chair for transfers or in a spot where she will have a sturdy surface to support herself (ie bathroom, kitchen counter). Her husband will help her from the bed to the powerchair which she says he has always done. Pt stating she doesn't need to be able to transfer any other way because she will not do it when she leaves here. Practiced sit to stand using sink for support (maxA) to change out cushion to allow pt to propel w/c with bilateral feet and LUE, praticed w/c mobility in room an hallway x 100' with supervision. Pt stating she doesn't plan on staying past next Tuesday, she is ready to go home.  Karolee Stamps [facility] 07/12/2011, 3:02 PM

## 2011-07-12 NOTE — Progress Notes (Signed)
Social Work Assessment and Plan Assessment and Plan  Patient Name: Haley Kennedy  [id number] Date: 07/12/2011  Problem List:  Patient Active Problem List  Diagnoses  . Rotator cuff (capsule) sprain  . Right rotator cuff tear    Past Medical History:  Past Medical History  Diagnosis Date  . Ulcer   . Hyperlipidemia   . 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 Hyperlipidemia   . Shortness of breath     with exertion;pt states its bc shes not in shape   . Asthma     as a child  . Peripheral neuropathy   . Swelling     from knee down;takes furosemide daily  . Chronic back pain   . Arthritis     low back and neck  . Gastric ulcer   . Diverticulosis   . Rotator cuff (capsule) sprain 07/09/2011    Past Surgical History:  Past Surgical History  Procedure Date  . Tonsillectomy   . Knee surgery   . Abdominal hysterectomy     9yrs ago  . Cholecystectomy     1997  . Foot surgery     1963  . Back surgery     after back surgery had to have iron infusion  . Back surgery     2009    Discharge Planning  Discharge Planning Living Arrangements: Spouse/significant other;Children Support Systems: Spouse/significant other;Children;Church/faith community Do you have any problems obtaining your medications?: No Type of Residence: Private residence Home Care Services: No Patient expects to be discharged to:: Home with husband assisting and daughter if necessary Case Management Consult Needed: Yes (Comment) (RNCM already following)  Social/Family/Support Systems Social/Family/Support Systems Patient Roles: Spouse;Parent Contact Information: William-husband [phone] home  [phone] cell   Macy Mis- daughter [phone]  Anticipated Caregiver: Husband Anticipated Caregiver's Contact Information: see above Ability/Limitations of Caregiver: Husband is healthy and was assisting pt prior to admission Caregiver Availability: 24/7  Employment  Status Employment Status Employment Status: Retired Date Retired/Disabled/Unemployed: disabled Fish farm manager Issues: No issues Guardian/Conservator: None  Abuse/Neglect Abuse/Neglect Assessment (Assessment to be complete while patient is alone) Physical Abuse: Denies Verbal Abuse: Denies Sexual Abuse: Denies Exploitation of patient/patient's resources: Denies Self-Neglect: Denies  Emotional Status Emotional Status Pt's affect, behavior adn adjustment status: Pt is motivated and wants to learn to be as independent. [place] has assisted prior to admission due to fall in Sept 2012, surgery not until this week. Recent Psychosocial Issues: Other health issues with back and feet.  Pyschiatric History: No history Deferred BDi due to coping appropriately with her surgery and recovery  Patient/Family Perceptions, Expectations & Goals Pt/Family Perceptions, Expectations and Goals Pt/Family understanding of illness & functional limitations: Pt and husband have a good understanding of her deficits and limitations with her shoulder. Husband here to observe in therapies Premorbid pt/family roles/activities: Wife, parent, Customer service manager, homewoner, etc Anticipated changes in roles/activities/participation: Plans to resume these roles Pt/family expectations/goals: Pt states: " I hope to beable to transfer myself, my hardest issue is sit to stand" She hopes to get stronger while here and learn different techniques. Husband wants her to regain some strength while here  [employer] Stores: None Premorbid Home Care/DME Agencies: None Transportation available at discharge: Husband  English as a second language teacher Resources: Media planner (specify) Theatre manager Medicare) Financial Resources: Family Support;SSD Financial Screen Referred: No Living Expenses: Lives with family Money Management: Spouse Home Management:  Husband Patient/Family Preliminary Plans:  Return home with husband who can assist and has been prior to admission.  Has most equipment and is getting a power wheelchair.  Clinical Impression:  Pleasant woman who is motivated and realistic regarding her improvement and recovery.  Her husband is here and very supportive. He has been helping her prior to admission.  Should be short length of stay.      Lucy Chris 07/12/2011

## 2011-07-12 NOTE — Progress Notes (Signed)
Occupational Therapy Assessment and Plan  Patient Details  Name: Haley Kennedy MRN: [id number] Date of Birth: April 15, 1946  OT Diagnosis: acute pain, muscular wasting and disuse atrophy and muscle weakness (generalized) Rehab Potential: Rehab Potential: Good ELOS: 2-3 weeks   Assessment & Plan Clinical Impression: Haley Kennedy is an 65 y.o. female with a history of Charcot-Marie-Tooth. She has had multiple bilateral foot surgeries as well as back surgery in the past limiting her to a rolling walker. Present 07/04/2011 after recent fall with right shoulder pain. X-ray of right shoulder showed a supraspinatus rotator cuff tear with some mild retraction along with a.c. arthrosis. Patient underwent right shoulder arthroscopy with open rotator cuff repair and distal clavicle acrominectomy per Dr. Devonne Doughty on 11 9. She was placed in a shoulder sling with limited weight bearing precautions and pendulum exercises. She currently requires total assistance to ambulate 3 feet with an assistive device. She does have bilateral AFO braces for her history of Charcot-Marie-Tooth. She is currently total assist for activities of daily living due to her right shoulder surgery. She was seen by inpatient rehabilitation services to be evaluated for comprehensive rehabilitation program.  Patient transferred to CIR on 07/11/2011 .  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  Prior to hospitalization, patient could complete basic self care tasks with supervision to min assist.  Patient will benefit from skilled intervention to decrease level of assist with basic self-care skills prior to discharge home with care partner.  Anticipate patient will require minimal physical assistance and follow up home health.  OT Assessment Rehab Potential: Good OT Plan OT Frequency: 1-2 X/day, 60-90 minutes Estimated Length of Stay: 2-3 weeks OT Treatment/Interventions: Ambulation/gait training;Balance/vestibular training;Community reintegration;DME/adaptive equipment instruction;Functional mobility training;Neuromuscular re-education;Pain management;Patient/family education;Self Care/advanced ADL retraining;Splinting/orthotics;Therapeutic Activities;Therapeutic Exercise;UE/LE Strength taining/ROM;UE/LE Coordination activities;Wheelchair propulsion/positioning  Precautions/Restrictions  Precautions Precautions: Shoulder Type of Shoulder Precautions: NWB right UE Precaution Comments: Right UE in sling and abduction position Required Braces or Orthoses: Yes Other Brace/Splint: Don Joy Shoulder sling with Abductor pillow Restrictions Weight Bearing Restrictions: Yes RUE Weight Bearing: Non weight bearing Other Position/Activity Restrictions: NWB right UE  General Chart Reviewed: Yes  Pain Pain Assessment Pain Assessment: No/denies pain Pain Score: 0-No pain (Patient reports no pain when right UE is supported)  Home Living/Prior Functioning Home Living Lives With: Spouse Receives Help From: Family Type of Home: House Home Layout: One level Home Access: Ramped entrance Bathroom Shower/Tub: Health visitor: Handicapped height Bathroom Accessibility: Yes How Accessible: Accessible via wheelchair;Accessible via walker Home Adaptive Equipment: Bedside commode/3-in-1;Grab bars  around toilet;Grab bars in shower;Hand-held shower hose (Life chair) IADL History Homemaking Responsibilities: No Current License: Yes Mode of Transportation: Family Occupation: On disability Prior Function Level of Independence: Needs assistance with ADLs;Needs assistance with homemaking;Needs assistance with gait;Needs assistance with tranfers Driving: Yes (prior  to fall)  ADL ADL Eating: Not assessed Grooming: Not assessed Upper Body Bathing: Minimal assistance Where Assessed-Upper Body Bathing: Edge of bed Lower Body Bathing: Maximal assistance Where Assessed-Lower Body Bathing: Edge of bed Upper Body Dressing: Moderate assistance Where Assessed-Upper Body Dressing: Edge of bed Lower Body Dressing: Dependent Tub/Shower Transfer: Not assessed Film/video editor: Not assessed  Vision/Perception  Vision - History Baseline Vision: Wears glasses for distance only Patient Visual Report: No change from baseline Vision - Assessment Eye Alignment: Within Functional Limits Vision Assessment: Vision not tested   Cognition Overall Cognitive Status: Appears within functional limits for tasks assessed Arousal/Alertness: Awake/alert Orientation Level: Oriented X4  Sensation Sensation Light Touch: Appears Intact Stereognosis: Appears Intact Hot/Cold: Appears Intact Proprioception: Appears Intact Additional Comments: Appears intact for bilateral UEs Coordination Gross Motor Movements are Fluid and Coordinated: Not tested Fine Motor Movements are Fluid and Coordinated: Yes   Extremity/Trunk Assessment RUE Assessment RUE Assessment: Not tested (in [facility]); elbow range of motion WFL LUE Assessment LUE Assessment: Within Functional Limits (for ADLs)  Recommendations for other services: Other: None at this time  Discharge Criteria: Patient will be discharged from OT if patient refuses treatment 3 consecutive times without medical reason, if treatment goals not met,  if there is a change in medical status, if patient makes no progress towards goals or if patient is discharged from hospital.  The above assessment, treatment plan, treatment alternatives and goals were discussed and mutually agreed upon: with patient  Session Notes  (1) Time In: 0800 Time Out: 0900 60 Minutes Individual Therapy No complaints of pain Initial 1:1 OT evaluation completed. Skilled OT treatment for emphasis on ADL retraining at edge of bed level; focusing on bed mobility, donning/doffing sling, UB bathing, UB dressing, LB bathing, donning bilateral shoes with bilateral AFOs, increasing overall activity tolerance/endurance.   (2) Time In: 1330 Time Out: 1415 45 Minutes Individual Therapy No complaints of pain Focused skilled occupational therapy on education/training with return demonstration on pendulum exercises from a seated position instead of standing position secondary to patients decreased standing balance/tolerance/endurance. Patient doffed sling independently to perform exercises, husband donned secondary to "he just can't stand to wait around for me to do things". Removed right arm rest from w/c secondary to patient's complaints of discomfort with arm rest when sling is on. Also focused on sit to stand and stand to sit from w/c at sink side with gait belt donned and total assist X2 with husband.     Monette Omara 07/12/2011, 9:40 AM

## 2011-07-13 DIAGNOSIS — D62 Acute posthemorrhagic anemia: Secondary | ICD-10-CM

## 2011-07-13 DIAGNOSIS — M7530 Calcific tendinitis of unspecified shoulder: Secondary | ICD-10-CM

## 2011-07-13 MED ORDER — TRAZODONE HCL 50 MG PO TABS
50.0000 mg | ORAL_TABLET | Freq: Every day | ORAL | Status: DC
  Administered 2011-07-13 – 2011-07-15 (×3): 50 mg via ORAL
  Filled 2011-07-13 (×4): qty 1

## 2011-07-13 MED ORDER — HYDROCODONE-ACETAMINOPHEN 5-325 MG PO TABS
1.0000 | ORAL_TABLET | ORAL | Status: DC | PRN
  Administered 2011-07-13: 2 via ORAL
  Administered 2011-07-16: 1 via ORAL
  Administered 2011-07-17: 2 via ORAL
  Filled 2011-07-13: qty 2
  Filled 2011-07-13: qty 1
  Filled 2011-07-13: qty 2

## 2011-07-13 NOTE — Progress Notes (Signed)
Ambulating with self with supervision. Right arm in Abductor sling.NWB to Right arm. Pain controlled with current medication regimen. Ankles 2+ edema. C/o some pain discomfort to feet. Encouraged patient to elevate feet while in bed. Incision to shoulder C/D/I.  Bilat AFO to LE on when OOB. Participating with therapy. Continue plan of care.

## 2011-07-13 NOTE — Progress Notes (Signed)
Occupational Therapy Session Note  Patient Details  Name: Haley Kennedy MRN: [id number] Date of Birth: September 14, 1945  Today's Date: 07/13/2011 Time: 14:45-15:30 Time Calculation (min):45 min  Precautions: Precautions Precautions: Shoulder Type of Shoulder Precautions: NWB right UE Precaution Comments: Right UE in sling and abduction position Required Braces or Orthoses: Yes Other Brace/Splint: Irena Cords Shoulder sling with Abductor pillow Short Term Goals: OT Short Term Goal 1: Patient will perform LB dressing with moderate assistance in any position OT Short Term Goal 2: Patient will donn doff Don Joy Sling independently OT Short Term Goal 3: Patient will be educated on HEP for right UE  Skilled Therapeutic Interventions/Progress Updates:    OT addressed right upper extremity pendulum exercises with pt/husband.  Pt. Returned demonstration of pendulum.  Emphasized postural control with trunk, scapula adduction, shoulder in neutral, forearm in neutral, neck in alignment.  Pt verbalized understanding of exercises and was able to return in sitting position.  Pt verbalized 6/10 shoulder pain and asked RN for pain meds.  Asked pt to do pendulum exercises 10 times, 3x day.  Applied ice to right shoulder for 10 minutes.  Asked pt to use ice treatment to shoulder after her daily exercises.        Pain Pain Assessment  Pain Score: 6/10 Faces Pain Scale: Hurts whole lot Pain Type: Surgical pain Pain Location: Shoulder Pain Orientation: Right Pain Descriptors: Aching;Sore Pain Onset: Other (Comment) (Following OT UE exercises) Patients Stated Pain Goal: 0 Pain Intervention(s): RN made aware Multiple Pain Sites: No Applied ice to right shoulder ADL      Therapy/Group: Individual Therapy  [name], [name] 07/13/2011, 4:38 PM

## 2011-07-13 NOTE — Progress Notes (Signed)
Inpatient Rehabilitation Center Individual Statement of Services  Patient Name:  Haley Kennedy  Date:  07/13/2011  Welcome to the Inpatient Rehabilitation Center.  Our goal is to provide you with an individualized program based on your diagnosis and situation, designed to meet your specific needs.  With this comprehensive rehabilitation program, you will be expected to participate in at least 3 hours of rehabilitation therapies Monday-Friday, with modified therapy programming on the weekends.  Your rehabilitation program will include the following services:  Physical Therapy (PT), Occupational Therapy (OT), 24 hour per day rehabilitation nursing, Therapeutic Recreaction (TR), Case Management (RN and Child psychotherapist), Rehabilitation Medicine, Nutrition Services and Pharmacy Services  Estimated Length of Stay: 2-3 weeks   Overall Predicted Outcome: Minimum - moderate assistance  Weekly team conferences will be held on Tuesdays to discuss your progress.  Your RN Case Designer, television/film set will talk with you frequently to get your input and to update you on team discussions.  Team conferences with you and your family in attendance may also be held.  Depending on your progress and recovery, your program may change.  Your RN Case Estate agent will coordinate services and will keep you informed of any changes.  Your RN Sports coach and SW names and contact numbers are listed  below.  The following services may also be recommended but are not provided by the Inpatient Rehabilitation Center:   Driving Evaluations  Home Health Rehabiltiation Services  Outpatient Rehabilitatation [facility]  Vocational Rehabilitation   Arrangements will be made to provide these services after discharge if needed.  Arrangements include referral to agencies that provide these services.  Your insurance has been verified to be:  Medicare Advantage Your primary doctor is: DR.PETER Charissa Bash     Pertinent information will be shared with your doctor and your insurance company.  Case Manager: Lutricia Horsfall, [facility] [phone]  Social Worker:  Dossie Der, [place] [phone]  Information discussed with and copy given to patient by: Meryl Dare, 07/13/2011, 3:45PM

## 2011-07-13 NOTE — Progress Notes (Signed)
Social Work  Met with pt and husband to discuss discharge needs. Pt reports feeling much better today and MD ordered a sleeping pill for tonight. She feels this is part of her problem, she is not sleeping. She had insurance coverage questions regarding co-pays, etc. Will inform Jola Babinski RNCM to see and address these questions. Discussed Home health follow up at discharge, pt prefers [facility] they are providing her power wheelchair once home.  Team plans to do home eval on Monday and if goes well discharge pt Tues. She is very pleased with this plan. Will make [facility] to [facility] in anticipation of discharge Tues. Jola Babinski [facility] to see after therapies. Continue to work toward discharge. Pt much less emotional today.

## 2011-07-13 NOTE — Progress Notes (Signed)
Patient information reviewed and entered into [employer] system by Schyler Counsell, RN, CRRN, PPS Coordinator.  Information including medical coding and functional independence measure will be reviewed and updated through discharge.     Per nursing patient was given "Data Collection Information Summary for Patients in Inpatient Rehabilitation Facilities with attached "Privacy Act Statement-Health Care Records" upon admission.   

## 2011-07-13 NOTE — Progress Notes (Signed)
Per State Regulation 482.30 This chart was reviewed for medical necessity with respect to the patient's Admission/Duration of stay. Pt is overall total assist with mobility and ADLs. Pt participating in therapies, but pt resistant toward learning new techniques. Pt upset today d/t poor sleep last night.  Trazodone ordered.   Meryl Dare                 Nurse Care Manager              Next Review Date: 07/16/11

## 2011-07-13 NOTE — Progress Notes (Signed)
Physical Therapy Session Note  Patient Details  Name: AUGUSTA MIRKIN MRN: [id number] Date of Birth: 06-04-1946  Today's Date: 07/13/2011 Time: 1478-2956 Time Calculation (min): 50 min  Precautions: Precautions Precautions: Shoulder Type of Shoulder Precautions: NWB right UE Precaution Comments: Right UE in sling and abduction position Required Braces or Orthoses: Yes Other Brace/Splint: Don Joy Shoulder sling with Abductor pillow Restrictions Weight Bearing Restrictions: Yes RUE Weight Bearing: Non weight bearing (to right arm) Other Position/Activity Restrictions: NWB right UE  Short Term Goals: PT Short Term Goal 1: Pt will be able to perfomr bed mobility with supervision PT Short Term Goal 2: Pt will be able to transfer modA bed/chair PT Short Term Goal 3: Pt will be able to perform functional sit to stand with modA from nonelevated surface  Pain Pain Assessment Pain Assessment: Faces Pain Score: 0-No pain Faces Pain Scale: Hurts whole lot Pain Type: Surgical pain Pain Location: Shoulder Pain Orientation: Right Pain Descriptors: Aching;Sore Pain Onset: Other (Comment) (Following OT UE exercises) Patients Stated Pain Goal: 0 Pain Intervention(s): RN made aware Multiple Pain Sites: No Mobility Transfers Stand Pivot Transfers: 3: Mod assist Stand Pivot Transfer Details: Visual cues/gestures for sequencing;Visual cues/gestures for precautions/safety;Verbal cues for sequencing;Verbal cues for technique;Verbal cues for precautions/safety Stand Pivot Transfer Details (indicate cue type and reason): Stand pivot transfers w/c <> Nustep with husband demonstrating mod A to lift patient's hips but patient performing pivoting with one UE support; also performed car transfer training x 2 reps with cues for patient hand placement, safe sequence.  Pt husband performed lifting of patient's hips from chair while patient performed pivot to and from elevated simulated van.  Discussed various  options and safety concerns with transfer and importance of practicing with actual Zenaida Niece or truck.  On final transfer patient stood from elevated "van" and pivoted supervision.    Exercises Cardiovascular Exercises NuStep: Level 3 x 12 minutes at >40 steps per minute with bilat LE and LUE   Therapy/Group: Individual Therapy  Edman Circle Faucette 07/13/2011, 4:40 PM

## 2011-07-13 NOTE — Progress Notes (Signed)
Occupational Therapy Session Note  Patient Details  Name: Haley Kennedy MRN: [id number] Date of Birth: April 11, 1946  Today's Date: 07/13/2011 Time: 1010-1110 Time Calculation (min): 60 min  Precautions: Precautions Precautions: Shoulder Type of Shoulder Precautions: NWB right UE Precaution Comments: Right UE in sling and abduction position Required Braces or Orthoses: Yes Other Brace/Splint: Don Joy Shoulder sling with Abductor pillow Restrictions Weight Bearing Restrictions: Yes RUE Weight Bearing: Non weight bearing (to right arm) Other Position/Activity Restrictions: NWB right UE  Short Term Goals: OT Short Term Goal 1: Patient will perform LB dressing with moderate assistance in any position OT Short Term Goal 2: Patient will donn doff Don Joy Sling independently OT Short Term Goal 3: Patient will be educated on HEP for right UE  Skilled Therapeutic Interventions/Progress Updates: Pt seen for ADL training of dressing skills from edge of bed with focus on one handed skills and reaching toward feet for LB dressing. Pt able to don undershirt and top with one handed techniques with verbal cues.  Pt was able direct caregiver to don sling and pt able to adjust sling with assist. Introduced idea of using reacher (pt owns one) to don pants over feet, but pt wants her husband to help with that task.  Pt raised height of bed to transfer to wheelchair using left hand on bed rail with min to mod assist.  Introduced pt. To sliding board, but pt unwilling to try it this morning.  Husband present at end of session and stated that they have a system down on how they complete her ADLs.     General General Chart Reviewed: Yes    Pain No pain   Therapy/Group: Individual Therapy  [name] 07/13/2011, 11:22 AM

## 2011-07-13 NOTE — Progress Notes (Signed)
Physical Therapy Note  Patient Details  Name: JANI MORONTA MRN: [id number] Date of Birth: [birth year] Today's Date: 07/13/2011  1335-1400, individual therapy. No c/o pain. Pt's husband return demonstrate stand pivot transfer to/from Nustep. Nustep for general lower extremity strengthening and activity tolerance on level 3 x 12 min. Pt has this at home and says she uses it everyday to strengthen her legs. Discussed home eval which is scheduled for Monday, purpose to see home set up and have pt and husband demonstrate the system they have in place in order to potentially decrease LOS estimate. Pt requires maxA sit to stand from w/c and Nustep, uses momentum to get up with husband A, but states this is how they have always done it and will continue to do it until her NWB status on RUE changes. W/c propulsion with bilateral legs and left arm x 150' with S.   Tedd Sias 07/13/2011, 2:07 PM

## 2011-07-13 NOTE — Progress Notes (Signed)
Subjective/Complaints: Poor sleep.  Has a hard time getting comfortable in bed due to prior back pain/surgery.  A little emotional because of poor sleep. Otherwise ROS negative.  Objective: Vital Signs: Blood pressure 113/73, pulse 72, temperature 97.8 F (36.6 C), temperature source Oral, resp. rate 18, height 5\' 6"  (1.676 m), weight 95.5 kg (210 lb 8.6 oz), SpO2 95.00%. No results found.  Basename 07/12/11 0635 07/11/11 1819  WBC 6.7 8.1  HGB 11.2* 12.6  HCT 33.8* 37.3  PLT 233 PLATELET CLUMPS NOTED ON SMEAR, COUNT APPEARS ADEQUATE    Basename 07/12/11 0635 07/11/11 1819  NA 141 135  K 4.1 4.2  CL 104 99  CO2 26 25  GLUCOSE 91 90  BUN 11 13  CREATININE 0.40* 0.30*  CALCIUM 9.2 9.4   CBG (last 3)  No results found for this basename: GLUCAP:3 in the last 72 hours  Wt Readings from Last 3 Encounters:  07/11/11 95.5 kg (210 lb 8.6 oz)  07/05/11 96.1 kg (211 lb 13.8 oz)  07/05/11 96.1 kg (211 lb 13.8 oz)    Physical Exam:  General appearance: alert and fatigued Head: Normocephalic, without obvious abnormality, atraumatic Eyes: conjunctivae/corneas clear. PERRL, EOM's intact. Fundi benign. Ears: normal TM's and external ear canals both ears Nose: Nares normal. Septum midline. Mucosa normal. No drainage or sinus tenderness. Throat: lips, mucosa, and tongue normal; teeth and gums normal Neck: no adenopathy, no carotid bruit, no JVD, supple, symmetrical, trachea midline and thyroid not enlarged, symmetric, no tenderness/mass/nodules Back: symmetric, no curvature. ROM normal. No CVA tenderness. back generally tender with ROM Resp: clear to auscultation bilaterally Cardio: regular rate and rhythm, S1, S2 normal, no murmur, click, rub or gallop GI: soft, non-tender; bowel sounds normal; no masses,  no organomegaly Extremities: edema 1+  bilateral pretibial  Pulses: 2+ and symmetric Skin: Skin color, texture, turgor normal. No rashes or lesions Neurologic: bilateral foot  drop and diminished PP and LT below waist. Incision/Wound: wound clean dry and intact.  Right UE neurovascularly intact   Assessment/Plan: 1. Functional deficits secondary to right rotator cuff injury/repair with hx of charcot marie tooth which require 3+ hours per day of interdisciplinary therapy in a comprehensive inpatient rehab setting. Physiatrist is providing close team supervision and 24 hour management of active medical problems listed below. Physiatrist and rehab team continue to assess barriers to discharge/monitor patient progress toward functional and medical goals. Mobility: Bed Mobility Supine to Sit: 3: Mod assist Sitting - Scoot to Edge of Bed: 5: Supervision Sit to Supine - Right: 4: Min assist;HOB flat;With rail Transfers Transfers: Yes Sit to Stand: 3: Mod assist;From elevated surface;From bed;With upper extremity assist;From chair/3-in-1 Stand to Sit: 3: Mod assist Stand Pivot Transfers: 3: Mod assist Lateral/Scoot Transfers: 3: Mod assist;1: +2 Total assist (+2 to hold w/c for safety) Ambulation/Gait Ambulation/Gait Assistance: Not tested (comment) Stairs: No (unsafe to attempt at this time -pregait only) Naval architect Mobility: Yes Wheelchair Assistance: 1: +1 Total assist (pt reports using BLEs to propel w/c at home, unable at eval) ADL:   Cognition: Cognition Overall Cognitive Status: Appears within functional limits for tasks assessed Arousal/Alertness: Awake/alert Orientation Level: Oriented X4 Safety/Judgment:  (decreased safety with mobility) Cognition Arousal/Alertness: Awake/alert Orientation Level: Oriented X4   Medical Problem List:  1.Rotator cuff tear s/p open rotator cuff repair and distal clavicle acrominectomy. Shoulder sling. Ok for pendelum exercises.  2.Charcot marie tooth-consertive care. AFO"s prior to admission. Patient may need more supportive/longer AFOs. It's probably not the most ideal  time to change these given  her rotator cuff injury. We'll observe gait pattern with therapy. A lot of her problems with transfers and mobility are related to her proximal sensory loss. Her strength is reasonable in her proximal legs and they should be strong enough to support her typically in gait or transfers.  3.Dvt prophylaxis-lovenox. Check platelets and follow for any signs or symptoms of bleeding complications.  4.pain management-norco,robaxin.Monitor with activity. She denies any significant pain complaints at present. Having some difficulties at night because she is unable to turn in bed 5.Htn-lasix , toprol daily.Monitor with increased activity  6.constipation-laxative assist. Nighttime stool softener/laxative.  7.mood-ego support and address insomnia  -wil schedule trazodone tonight 8. ABLA: dietary Fe++, hgb trending up     Kano Heckmann T 07/13/2011, 7:15 AM

## 2011-07-14 NOTE — Progress Notes (Signed)
Patient alert and oriented x 3 - uses call bell appropriately. Patient has not reported any need for pain medication. Patient able to stand pivot with min/mod assist. Patient Rt arm in sling- NWB. Patient steri strips  intact to right shoulder and dressing intact. Patient wears Bilateral AFOs when getting OOB. Patient is continent of bowel and bladder with last bowel movement being on 11/16. Patient has rash to right upper thigh from ted hose at one time per report. Patient has 2+ edema to BLE. Patient anxious at times about positioning for best comfort in bed. Patient appetite good. Continue with plan of care.

## 2011-07-14 NOTE — Progress Notes (Signed)
Physical Therapy Note  Patient Details  Name: Haley Kennedy MRN: [id number] Date of Birth: [birth year] Today's Date: 07/14/2011  Time: 0930-1030 Time: 1330-1415  Pain; R shoulder currently 1/10  Patient up in w/c with R pillow arm splint/sling in place.  Patient agreeable to participate with PT and mentioning that she has found it very difficulty to sleep comfortably at night. Patient also noting that use of gait belt around waist with family training for transfers was very uncomfortable.      Therapeutic Activity to include;  Instructions and assistance to position R UE appropriately in pillow splint and transferred patient into recliner chair with instructions on use of pillows to offer support with sling on for sleeping tonight.  Transfer training with gait belt applied under armpits and squat-pivot transfer w/c<->recliner chair toward L side with Mod-A.  Family/caregiver training with husband and daughter with squat-pivot transfers using gait belt under armpits and blocking L knee with transfers toward patients' L side.  Nu-Step x 500 steps at level 3.  Therapeutic Exercise for LE's in sitting.    Individual Treatment session.  Jodelle Gross 07/14/2011, 6:08 PM

## 2011-07-14 NOTE — Progress Notes (Signed)
Subjective/Complaints: Poor sleep.  Has a hard time getting comfortable in bed due to prior back pain/surgery.  A little emotional because of poor sleep.  Anxious for d/c-  Home visit scheduled for Monday Otherwise ROS negative.  Objective: Vital Signs: Blood pressure 104/67, pulse 66, temperature 97.6 F (36.4 C), temperature source Oral, resp. rate 20, height 5\' 6"  (1.676 m), weight 210 lb 8.6 oz (95.5 kg), SpO2 93.00%. No results found.  Basename 07/12/11 0635 07/11/11 1819  WBC 6.7 8.1  HGB 11.2* 12.6  HCT 33.8* 37.3  PLT 233 PLATELET CLUMPS NOTED ON SMEAR, COUNT APPEARS ADEQUATE    Basename 07/12/11 0635 07/11/11 1819  NA 141 135  K 4.1 4.2  CL 104 99  CO2 26 25  GLUCOSE 91 90  BUN 11 13  CREATININE 0.40* 0.30*  CALCIUM 9.2 9.4   CBG (last 3)  No results found for this basename: GLUCAP:3 in the last 72 hours  Wt Readings from Last 3 Encounters:  07/11/11 210 lb 8.6 oz (95.5 kg)  07/05/11 211 lb 13.8 oz (96.1 kg)  07/05/11 211 lb 13.8 oz (96.1 kg)   BP Readings from Last 3 Encounters:  07/14/11 104/67  07/11/11 98/61  07/11/11 98/61    Physical Exam:  General appearance: alert and fatigued Head: Normocephalic, without obvious abnormality, atraumatic Eyes: conjunctivae/corneas clear. PERRL, EOM's intact. Fundi benign. Ears: normal TM's and external ear canals both ears Nose: Nares normal. Septum midline. Mucosa normal. No drainage or sinus tenderness. Throat: lips, mucosa, and tongue normal; teeth and gums normal Neck: no adenopathy, no carotid bruit, no JVD, supple, symmetrical, trachea midline and thyroid not enlarged, symmetric, no tenderness/mass/nodules Back: symmetric, no curvature. ROM normal. No CVA tenderness. back generally tender with ROM Resp: clear to auscultation bilaterally Cardio: regular rate and rhythm, S1, S2 normal, no murmur, click, rub or gallop GI: soft, non-tender; bowel sounds normal; no masses,  no organomegaly Extremities: edema  1+  bilateral pretibial  Pulses: 2+ and symmetric Skin: Skin color, texture, turgor normal. No rashes or lesions Neurologic: bilateral foot drop and diminished PP and LT below waist. Incision/Wound: wound clean dry and intact.  Right UE neurovascularly intact   Assessment/Plan: 1. Functional deficits secondary to right rotator cuff injury/repair with hx of charcot marie tooth which require 3+ hours per day of interdisciplinary therapy in a comprehensive inpatient rehab setting. Physiatrist is providing close team supervision and 24 hour management of active medical problems listed below. Physiatrist and rehab team continue to assess barriers to discharge/monitor patient progress toward functional and medical goals. Mobility: Bed Mobility Supine to Sit: 3: Mod assist Sitting - Scoot to Edge of Bed: 5: Supervision Sit to Supine - Right: 4: Min assist;HOB flat;With rail Transfers Transfers: Yes Sit to Stand: 3: Mod assist;From elevated surface;From bed;With upper extremity assist;From chair/3-in-1 Stand to Sit: 3: Mod assist Stand Pivot Transfers: 3: Mod assist Stand Pivot Transfer Details (indicate cue type and reason): Stand pivot transfers w/c <> Nustep with husband demonstrating mod A to lift patient's hips but patient performing pivoting with one UE support; also performed car transfer training x 2 reps with cues for patient hand placement, safe sequence.  Pt husband performed lifting of patient's hips from chair while patient performed pivot to and from elevated simulated van.  Discussed various options and safety concerns with transfer and importance of practicing with actual Zenaida Niece or truck.  On final transfer patient stood from elevated "van" and pivoted supervision.   Lateral/Scoot Transfers: 3: Mod  assist;1: +2 Total assist (+2 to hold w/c for safety) Ambulation/Gait Ambulation/Gait Assistance: Not tested (comment) Stairs: No (unsafe to attempt at this time -pregait only) Radiation protection practitioner Mobility: Yes Wheelchair Assistance: 1: +1 Total assist (pt reports using BLEs to propel w/c at home, unable at eval) ADL:   Cognition: Cognition Overall Cognitive Status: Appears within functional limits for tasks assessed Arousal/Alertness: Awake/alert Orientation Level: Oriented X4 Safety/Judgment:  (decreased safety with mobility) Cognition Arousal/Alertness: Awake/alert Orientation Level: Oriented X4   Medical Problem List:  1.Rotator cuff tear s/p open rotator cuff repair and distal clavicle acrominectomy. Shoulder sling. Ok for pendelum exercises.  2.Charcot marie tooth-consertive care. AFO"s prior to admission. Patient may need more supportive/longer AFOs. It's probably not the most ideal time to change these given her rotator cuff injury. We'll observe gait pattern with therapy. A lot of her problems with transfers and mobility are related to her proximal sensory loss. Her strength is reasonable in her proximal legs and they should be strong enough to support her typically in gait or transfers.  3.Dvt prophylaxis-lovenox. Check platelets and follow for any signs or symptoms of bleeding complications.  4.pain management-norco,robaxin.Monitor with activity. She denies any significant pain complaints at present. Having some difficulties at night because she is unable to turn in bed 5.Htn-lasix , toprol daily.Monitor with increased activity  6.constipation-laxative assist. Nighttime stool softener/laxative.  7.mood-ego support and address insomnia  -wil schedule trazodone tonight 8. ABLA: dietary Fe++, hgb trending up     Rogelia Boga 07/14/2011, 10:17 AM

## 2011-07-14 NOTE — Progress Notes (Signed)
Occupational Therapy Session Note  Patient Details  Name: Haley Kennedy MRN: [id number] Date of Birth: 22-Sep-1945  Today's Date: 07/14/2011 Time: 1100-1200 Time Calculation (min): 60 min  Precautions: Precautions Precautions: Shoulder Type of Shoulder Precautions: NWB right UE Precaution Comments: Right UE in sling and abduction position Required Braces or Orthoses: Yes Other Brace/Splint: Don Joy Shoulder sling with Abductor pillow Restrictions Weight Bearing Restrictions: Yes RUE Weight Bearing: Non weight bearing Other Position/Activity Restrictions: NWB right UE  Short Term Goals: OT Short Term Goal 1: Patient will perform LB dressing with moderate assistance in any position OT Short Term Goal 2: Patient will donn doff Don Joy Sling independently OT Short Term Goal 3: Patient will be educated on HEP for right UE  Skilled Therapeutic Interventions/Progress Updates:  ADL in w/c at sink. Patient had previously completed peribathing and donning of pants with nursing prior to OT session    Pain no c/os    Therapy/Group: Individual Therapy  Bud Face [facility] 07/14/2011, 4:24 PM

## 2011-07-14 NOTE — Progress Notes (Signed)
Physical Therapy Note  Patient Details  Name: Haley Kennedy MRN: [id number] Date of Birth: [birth year] Today's Date: 07/14/2011  Time: 1542-1610 28 minutes  No c/o pain.  Pt/husband educated on and demo'd car transfers.  SPT to simulated van height with husband providing mod A with cues for proper body positioning, blocking pt's L LE.  Sliding board transfer to simulated sedan height.  PT demo and Pt/husband demo'd with cues from PT for board placement and safe technique. Pt required only close supervision to min A for sliding board transfers. Discussed benefits of this transfer for pt/family safety.  Pt and family reported understanding and said they would discuss which vehicle/technique would best fit their needs at home.  Individual therapy   Lyana Asbill 07/14/2011, 4:30 PM

## 2011-07-15 MED ORDER — DIPHENOXYLATE-ATROPINE 2.5-0.025 MG PO TABS
2.0000 | ORAL_TABLET | Freq: Four times a day (QID) | ORAL | Status: DC | PRN
  Administered 2011-07-15: 2 via ORAL
  Filled 2011-07-15: qty 2

## 2011-07-15 NOTE — Progress Notes (Signed)
Occupational Therapy Session Note  Patient Details  Name: Haley Kennedy MRN: [id number] Date of Birth: Jan 20, 1946  Today's Date: 07/15/2011 Time: 1345-1430 Time Calculation (min): 45 min  Precautions: Precautions Precautions: Shoulder Type of Shoulder Precautions: NWB right UE Precaution Comments: Right UE in sling and abduction position Required Braces or Orthoses: Yes Other Brace/Splint: Don Joy Shoulder sling with Abductor pillow Restrictions Weight Bearing Restrictions: Yes RUE Weight Bearing: Non weight bearing Other Position/Activity Restrictions: NWB right UE  Short Term Goals: OT Short Term Goal 1: Patient will perform LB dressing with moderate assistance in any position OT Short Term Goal 2: Patient will donn doff Don Joy Sling independently OT Short Term Goal 3: Patient will be educated on HEP for right UE  Skilled Therapeutic Interventions/Progress Updates:    Addressed Right Upper extremity sling donning and doffing.  Husband is independent in performing.  Demonstrated to pt so she could direct other caregivers.  Pt returned demonstration on pendulum exercises and added new ones to her exercise program.  Pt. Reports she has had diarrhea for past 2 days and thinks it is related to the laxatives.  RN already informed.  Pain:  none        Therapy/Group: Individual Therapy  Wells, Gerdeman 07/15/2011, 5:42 PM

## 2011-07-15 NOTE — Progress Notes (Signed)
Subjective/Complaints: Poor sleep.  Has a hard time getting comfortable in bed due to prior back pain/surgery.  A little emotional because of poor sleep.  Anxious for d/c-  Home visit scheduled for Monday Otherwise ROS negative.  Objective: Vital Signs: Blood pressure 105/66, pulse 70, temperature 98.5 F (36.9 C), temperature source Oral, resp. rate 18, height 5\' 6"  (1.676 m), weight 210 lb 8.6 oz (95.5 kg), SpO2 93.00%. No results found. No results found for this basename: WBC:2,HGB:2,HCT:2,PLT:2 in the last 72 hours No results found for this basename: NA:2,K:2,CL:2,CO2:2,GLUCOSE:2,BUN:2,CREATININE:2,CALCIUM:2 in the last 72 hours CBG (last 3)  No results found for this basename: GLUCAP:3 in the last 72 hours  Wt Readings from Last 3 Encounters:  07/11/11 210 lb 8.6 oz (95.5 kg)  07/05/11 211 lb 13.8 oz (96.1 kg)  07/05/11 211 lb 13.8 oz (96.1 kg)   BP Readings from Last 3 Encounters:  07/15/11 105/66  07/11/11 98/61  07/11/11 98/61    Physical Exam:  General appearance: alert and fatigued Head: Normocephalic, without obvious abnormality, atraumatic Eyes: conjunctivae/corneas clear. PERRL, EOM's intact. Fundi benign. Ears: normal TM's and external ear canals both ears Nose: Nares normal. Septum midline. Mucosa normal. No drainage or sinus tenderness. Throat: lips, mucosa, and tongue normal; teeth and gums normal Neck: no adenopathy, no carotid bruit, no JVD, supple, symmetrical, trachea midline and thyroid not enlarged, symmetric, no tenderness/mass/nodules Back: symmetric, no curvature. ROM normal. No CVA tenderness. back generally tender with ROM Resp: clear to auscultation bilaterally Cardio: regular rate and rhythm, S1, S2 normal, no murmur, click, rub or gallop GI: soft, non-tender; bowel sounds normal; no masses,  no organomegaly Extremities: edema 1+  bilateral pretibial  Pulses: 2+ and symmetric Skin: Skin color, texture, turgor normal. No rashes or  lesions Neurologic: bilateral foot drop and diminished PP and LT below waist. Incision/Wound: wound clean dry and intact.  Right UE neurovascularly intact   Assessment/Plan: 1. Functional deficits secondary to right rotator cuff injury/repair with hx of charcot marie tooth which require 3+ hours per day of interdisciplinary therapy in a comprehensive inpatient rehab setting. Physiatrist is providing close team supervision and 24 hour management of active medical problems listed below. Physiatrist and rehab team continue to assess barriers to discharge/monitor patient progress toward functional and medical goals. Mobility: Bed Mobility Supine to Sit: 3: Mod assist Sitting - Scoot to Edge of Bed: 5: Supervision Sit to Supine - Right: 4: Min assist;HOB flat;With rail Transfers Transfers: Yes Sit to Stand: 3: Mod assist;From elevated surface;From bed;With upper extremity assist;From chair/3-in-1 Stand to Sit: 3: Mod assist Stand Pivot Transfers: 3: Mod assist Stand Pivot Transfer Details (indicate cue type and reason): Stand pivot transfers w/c <> Nustep with husband demonstrating mod A to lift patient's hips but patient performing pivoting with one UE support; also performed car transfer training x 2 reps with cues for patient hand placement, safe sequence.  Pt husband performed lifting of patient's hips from chair while patient performed pivot to and from elevated simulated van.  Discussed various options and safety concerns with transfer and importance of practicing with actual Zenaida Niece or truck.  On final transfer patient stood from elevated "van" and pivoted supervision.   Lateral/Scoot Transfers: 3: Mod assist;1: +2 Total assist (+2 to hold w/c for safety) Ambulation/Gait Ambulation/Gait Assistance: Not tested (comment) Stairs: No (unsafe to attempt at this time -pregait only) Naval architect Mobility: Yes Wheelchair Assistance: 1: +1 Total assist (pt reports using BLEs to  propel w/c at  home, unable at eval) ADL:   Cognition: Cognition Overall Cognitive Status: Appears within functional limits for tasks assessed Arousal/Alertness: Awake/alert Orientation Level: Oriented X4 Safety/Judgment:  (decreased safety with mobility) Cognition Arousal/Alertness: Awake/alert Orientation Level: Oriented X4   Medical Problem List:  1.Rotator cuff tear s/p open rotator cuff repair and distal clavicle acrominectomy. Shoulder sling. Ok for pendelum exercises.  2.Charcot marie tooth-consertive care. AFO"s prior to admission. Patient may need more supportive/longer AFOs. It's probably not the most ideal time to change these given her rotator cuff injury. We'll observe gait pattern with therapy. A lot of her problems with transfers and mobility are related to her proximal sensory loss. Her strength is reasonable in her proximal legs and they should be strong enough to support her typically in gait or transfers.  3.Dvt prophylaxis-lovenox. Check platelets and follow for any signs or symptoms of bleeding complications.  4.pain management-norco,robaxin.Monitor with activity. She denies any significant pain complaints at present. Having some difficulties at night because she is unable to turn in bed 5.Htn-lasix , toprol daily.Monitor with increased activity  6.constipation-laxative assist. Nighttime stool softener/laxative.  7.mood-ego support and address insomnia  -wil schedule trazodone tonight 8. ABLA: dietary Fe++, hgb trending up     Haley Kennedy 07/15/2011, 9:37 AM

## 2011-07-15 NOTE — Progress Notes (Signed)
Patient alert and oriented x 3 - uses call bell appropriately. Patient has not reported any need for pain medication. Patient able to stand pivot with min/mod assist. Patient Rt arm in sling- NWB. Patient steri strips  intact to right shoulder and dressing intact. Patient wears Bilateral AFOs when getting OOB. Patient is continent of bowel and bladder with last bowel movement being on 11/18. Patient has been having some diarrhea today. MD Amador Cunas notified. New orders received. Patient given lomotil at 1350 which appeared effective. Patient has rash to right upper thigh from ted hose at one time. Patient has 2+ edema to BLE. Patient appetite good. Continue with plan of care.

## 2011-07-16 MED ORDER — METHOCARBAMOL 500 MG PO TABS: 500.0000 mg | ORAL_TABLET | Freq: Four times a day (QID) | ORAL | Status: AC | PRN

## 2011-07-16 MED ORDER — ALPRAZOLAM 0.25 MG PO TABS: 0.2500 mg | ORAL_TABLET | Freq: Three times a day (TID) | ORAL | Status: AC | PRN

## 2011-07-16 MED ORDER — HYDROCODONE-ACETAMINOPHEN 5-325 MG PO TABS: 1.0000 | ORAL_TABLET | ORAL | Status: AC | PRN

## 2011-07-16 MED ORDER — FUROSEMIDE 20 MG PO TABS: 20.0000 mg | ORAL_TABLET | Freq: Every day | ORAL | Status: DC

## 2011-07-16 NOTE — Discharge Summary (Signed)
NAME:  Haley Kennedy, Haley Kennedy                 ACCOUNT NO.:  [id number]  MEDICAL RECORD NO.:  [id number]  LOCATION:  4010                         FACILITY:  [facility]  PHYSICIAN:  Ranelle Oyster, M.D.DATE OF BIRTH:  1946/02/17  DATE OF ADMISSION:  07/11/2011 DATE OF DISCHARGE:                              DISCHARGE SUMMARY   DISCHARGE DIAGNOSES: 1. Right rotator cuff injury with repair. 2. Charcot-Marie-Tooth syndrome.  Subcutaneous Lovenox for deep vein     thrombosis prophylaxis, pain management and hypertension.     Constipation resolved.  This is a 65 year old white female with history of Charco- Marie-Tooth syndrome, multiple bilateral foot surgery as well as back surgery in the past limiting her to a rolling walker.  Presented July 04, 2011, after recent fall with x-rays of right shoulder secondary to increased pain showing a supraspinatus rotator cuff tear with some mild retraction along with an A.C. arthrosis.  The patient underwent right shoulder arthroscopy with open rotator cuff repair and distal clavicle, acromionectomy per Dr. Ranell Patrick on July 06, 2011.  She was placed in a shoulder sling with limited weightbearing precautions and pendulum exercises.  She currently required total assist ambulate 3 feet.  She is wearing bilateral AFO braces for her history of Charcot-Marie-Tooth. She was currently total assist for activities due to her right shoulder surgery.  She is admitted for comprehensive rehab program.  PAST MEDICAL HISTORY:  See discharge diagnoses.  ALLERGIES:  CORTICOSTEROIDS and PENICILLIN.  SOCIAL HISTORY:  She is married.  Her husband can assist on discharge. They live in a 1 level home with a ramped entrance.  FUNCTIONAL HISTORY:  Prior to admission, she needed assistance with activities of daily living.  Needed assistance with home making. Minimal assist for toileting.  Moderate assist for dressing.  FUNCTIONAL STATUS:  Upon admission to rehab services was  total assist except minimal assist to go from sit to supine position.  She was total assist for ambulation.  PHYSICAL EXAMINATION:  VITAL SIGNS:  Blood pressure 110/60, pulse 76, temperature 98.9, respirations 20. GENERAL:  This was an alert female in no acute distress, oriented x3, well developed. HEENT:  Normocephalic. LUNGS:  Clear to auscultation. CARDIAC:  Regular rate and rhythm. ABDOMEN:  Soft, nontender.  Good bowel sounds.  MUSCULOSKELETAL: Shoulder incision dressed.  The patient with bilateral sensory loss to pinprick and light touch growing increasingly significant from the thighs to the toes.  She had bilateral foot drop.  Quadriceps, hamstring strength is approximately 2-3/5.  Hip strength was 2/5.  REHABILITATION HOSPITAL COURSE:  The patient was admitted to inpatient rehab services with therapies initiated on a 3 hour daily basis consisting of physical therapy, occupational therapy, and 24-hour rehabilitation nursing.  The following issues were addressed during the patient's rehabilitation stay.  Pertaining to [name]'s recent rotator cuff injury with repair, surgical site healing nicely.  She would follow up with Dr. Ranell Patrick of Orthopedic Services.  She was participating with pendulum exercises with a shoulder sling in place when up with therapies.  She remained on subcutaneous Lovenox for deep vein thrombosis prophylaxis throughout her rehab course.  Pain management with the use of Norco with good pain relief.  She did have some bouts of constipation which was resolved with laxative assistance.  She remained on her Toprol as well as Lasix for hypertension with no orthostatic changes noted.  She was voiding without difficulty.  The patient received weekly collaborative interdisciplinary team conferences to discuss estimated length of stay, family teaching, and any barriers to discharge.  She still needed assist with adjusting clothing after toileting.  She was able  to perform routine hygiene and adjust for clothing prior to toileting.  She was using a grab bar or rail for support.  She was moderate assist for toileting overall.  She was supine to sitting position with minimal assistance, sit to supine with minimal assistance, bed-to-chair or wheelchair with moderate assistance.  Again, her mobility remained limited due to recent shoulder surgery and her Charcot-Marie-Tooth disease.  She exhibited no unsafe behavior.  She was to be discharged to home with ongoing therapies as dictated per rehab Services.  LABORATORY FINDINGS:  Latest labs showed a sodium 139, potassium 3.9, BUN 9, creatinine is 0.38.  Hemoglobin 12.4, hematocrit 35.8, platelet 188,000.  DISCHARGE MEDICATIONS:  At the time of dictation included; 1. Toprol-XL 50 mg one-half tablet daily. 2. Omega fish oil 2 times daily. 3. Xanax 0.25 mg 3 times daily as needed anxiety. 4. Lasix 20 mg daily. 5. Norco 5/325 one or two tablets every 4 hours as needed pain. 6. Robaxin 500 mg every 6 hours as needed muscle spasms. 7. Tylenol as needed.  Her diet was regular.  SPECIAL INSTRUCTIONS:  Right shoulder sling was advised with pendulum exercises as directed per Dr. Ranell Patrick of Orthopedic Services.  Follow up Dr. Faith Rogue the outpatient rehab service office as needed. Follow up primary care provider in 1-2 weeks medical management.     Mariam Dollar, P.A.   ______________________________ Ranelle Oyster, M.D.    DA/MEDQ  D:  07/16/2011  T:  07/16/2011  Job:  [id number]  cc:   Ranelle Oyster, M.D. Almedia Balls. Ranell Patrick, M.D.

## 2011-07-16 NOTE — Progress Notes (Signed)
Occupational Therapy Session Notes  Patient Details  Name: Haley Kennedy MRN: [id number] Date of Birth: 1945/10/27  Today's Date: 07/16/2011  Session 1 Time: 0730-0830 Time Calculation (min): 60 min Individual Treatment No complaints of pain  Session 2 Time: 1000-1045 Time Calculation (min): 45 min Co-Treatment 1000-11:30 with Physical Therapy for Home Evaluation No complaints of pain  Precautions: Precautions Precautions: Shoulder Type of Shoulder Precautions: NWB right UE Precaution Comments: Right UE in sling and abduction position Required Braces or Orthoses: Yes Other Brace/Splint: Don Joy Shoulder sling with Abductor pillow Restrictions Weight Bearing Restrictions: Yes RUE Weight Bearing: Non weight bearing Other Position/Activity Restrictions: NWB right UE  Short Term Goals: OT Short Term Goal 1: Patient will perform LB dressing with moderate assistance in any position OT Short Term Goal 2: Patient will donn doff Don Joy Sling independently OT Short Term Goal 3: Patient will be educated on HEP for right UE  Session One:    Pt seen for ADL retraining to include grooming, bathing, dressing and toileting.  Focused session on Balance, Endurance, Motor and Safety, decreased standing balance and decreased postural control. Bathing and dressing completed at bed level; edge of bed. Completed transfer to [facility] from EOB and from [facility] to w/c. Grooming tasks completed at sink, seated in w/c.    Session Two: Co-treatment with Physical Therapy for Home evaluation with husband present and demonstrating safe hands on transfers, sit to stands with patient, stand to sits with patient, and functional mobility throughout house. See Home Evaluation Checklist for more information.    Jesiah Yerby 07/16/2011, 12:35 PM

## 2011-07-16 NOTE — Progress Notes (Signed)
Social Work  Home eval went well, according to team and pt. Pt feels ready for discharge tomorrow. She is in agreement with home health Therapies and only needs is a transfer board.  Referral made to [facility] for follow up PT, OT, RN and also transfer  Board.  Husband pleased and comfortable with discharge tomorrow.

## 2011-07-16 NOTE — Progress Notes (Signed)
Physical Therapy Treatment Note  Patient Details  Name: Haley Kennedy MRN: [id number] Date of Birth: [birth year] Today's Date: 07/16/2011  1420-1505, individual therapy. No c/o pain. States that she is worn out from Home Eval this morning though but felt that it went well. W/c propulsion for general lower extremity strengthening using bilateral lower extremities and LUE x150' x2 modified independent. Practiced slideboard transfer from w/c to Nustep with minA (uphill transfer), requiring steady A for balance. Nustep level 3 x 8 min for general activity tolerance and LE strengthening. Lateral scoot transfer from Nustep to w/c with supervision and to stabilize w/c. Discussed use of slideboard again in the home and for the car transfer. Pt interested in purchasing a manual w/c for which the armrests can be removed to A with slideboard transfers to decrease A needed from her husband. Discussed with social worker to further discuss options and payment.   Karolee Stamps [facility] 07/16/2011, 3:32 PM

## 2011-07-16 NOTE — Progress Notes (Signed)
Subjective/Complaints: Sleeping better.  Anxious to go home.  Home safety eval today. Otherwise ROS negative.  Objective: Vital Signs: Blood pressure 109/47, pulse 58, temperature 97.5 F (36.4 C), temperature source Oral, resp. rate 19, height 5\' 6"  (1.676 m), weight 95.5 kg (210 lb 8.6 oz), SpO2 98.00%. No results found. No results found for this basename: WBC:2,HGB:2,HCT:2,PLT:2 in the last 72 hours No results found for this basename: NA:2,K:2,CL:2,CO2:2,GLUCOSE:2,BUN:2,CREATININE:2,CALCIUM:2 in the last 72 hours CBG (last 3)  No results found for this basename: GLUCAP:3 in the last 72 hours  Wt Readings from Last 3 Encounters:  07/11/11 95.5 kg (210 lb 8.6 oz)  07/05/11 96.1 kg (211 lb 13.8 oz)  07/05/11 96.1 kg (211 lb 13.8 oz)   BP Readings from Last 3 Encounters:  07/16/11 109/47  07/11/11 98/61  07/11/11 98/61    Physical Exam:  General appearance: alert and fatigued Head: Normocephalic, without obvious abnormality, atraumatic Eyes: conjunctivae/corneas clear. PERRL, EOM's intact. Fundi benign. Ears: normal TM's and external ear canals both ears Nose: Nares normal. Septum midline. Mucosa normal. No drainage or sinus tenderness. Throat: lips, mucosa, and tongue normal; teeth and gums normal Neck: no adenopathy, no carotid bruit, no JVD, supple, symmetrical, trachea midline and thyroid not enlarged, symmetric, no tenderness/mass/nodules Back: symmetric, no curvature. ROM normal. No CVA tenderness. back generally tender with ROM Resp: clear to auscultation bilaterally Cardio: regular rate and rhythm, S1, S2 normal, no murmur, click, rub or gallop GI: soft, non-tender; bowel sounds normal; no masses,  no organomegaly Extremities: edema 1+  bilateral pretibial  Pulses: 2+ and symmetric Skin: Skin color, texture, turgor normal. No rashes or lesions Neurologic: bilateral foot drop and diminished PP and LT below waist. Incision/Wound: wound clean dry and intact.   Right UE neurovascularly intact   Assessment/Plan: 1. Functional deficits secondary to right rotator cuff injury/repair with hx of charcot marie tooth which require 3+ hours per day of interdisciplinary therapy in a comprehensive inpatient rehab setting. Physiatrist is providing close team supervision and 24 hour management of active medical problems listed below. Physiatrist and rehab team continue to assess barriers to discharge/monitor patient progress toward functional and medical goals.  Team working on Personal assistant with patient and husband.  Mobility: Bed Mobility Supine to Sit: 3: Mod assist Sitting - Scoot to Edge of Bed: 5: Supervision Sit to Supine - Right: 4: Min assist;HOB flat;With rail Transfers Transfers: Yes Sit to Stand: 3: Mod assist;From elevated surface;From bed;With upper extremity assist;From chair/3-in-1 Stand to Sit: 3: Mod assist Stand Pivot Transfers: 3: Mod assist Stand Pivot Transfer Details (indicate cue type and reason): Stand pivot transfers w/c <> Nustep with husband demonstrating mod A to lift patient's hips but patient performing pivoting with one UE support; also performed car transfer training x 2 reps with cues for patient hand placement, safe sequence.  Pt husband performed lifting of patient's hips from chair while patient performed pivot to and from elevated simulated van.  Discussed various options and safety concerns with transfer and importance of practicing with actual Zenaida Niece or truck.  On final transfer patient stood from elevated "van" and pivoted supervision.   Lateral/Scoot Transfers: 3: Mod assist;1: +2 Total assist (+2 to hold w/c for safety) Ambulation/Gait Ambulation/Gait Assistance: Not tested (comment) Stairs: No (unsafe to attempt at this time -pregait only) Naval architect Mobility: Yes Wheelchair Assistance: 1: +1 Total assist (pt reports using BLEs to propel w/c at home, unable at eval) ADL:    Cognition:  Cognition Overall Cognitive Status: Appears within functional limits for tasks assessed Arousal/Alertness: Awake/alert Orientation Level: Oriented X4 Safety/Judgment:  (decreased safety with mobility) Cognition Arousal/Alertness: Awake/alert Orientation Level: Oriented X4   Medical Problem List:  1.Rotator cuff tear s/p open rotator cuff repair and distal clavicle acrominectomy. Shoulder sling. Ok for pendelum exercises.  2.Charcot marie tooth-consertive care. AFO"s prior to admission. Patient may need more supportive/longer AFOs. It's probably not the most ideal time to change these given her rotator cuff injury. We'll observe gait pattern with therapy. A lot of her problems with transfers and mobility are related to her proximal sensory loss. Her strength is reasonable in her proximal legs and they should be strong enough to support her typically in gait or transfers.  3.Dvt prophylaxis-lovenox. Check platelets and follow for any signs or symptoms of bleeding complications.  4.pain management-norco,robaxin.Monitor with activity. She denies any significant pain complaints at present. Having some difficulties at night because she is unable to turn in bed 5.Htn-lasix , toprol daily.Monitor with increased activity  6.constipation-laxative assist. Nighttime stool softener/laxative.  7.mood-ego support and address insomnia  schedule dazodone at night very helpful. 8. ABLA: dietary Fe++, hgb trending up     [facility] T 07/16/2011, 7:41 AM

## 2011-07-16 NOTE — Progress Notes (Signed)
Per State Regulation 482.30 This chart was reviewed for medical necessity with respect to the patient's Admission/Duration of stay. Pt participating in therapies with steady progress. Pt and spouse requesting discharge soon. Therapists are evaluating pt's home today and will make d/c plan after home eval.     Lane-Morgan, Ninfa Linden                 Nurse Care Manager              Next Review Date: 07/18/11

## 2011-07-16 NOTE — Progress Notes (Signed)
Bedside cpmmode

## 2011-07-16 NOTE — Progress Notes (Addendum)
Pt alert and oriented x3 today. Went for home evaluation today from 0800-1200. Voids in BR toilet, min to mod A stand pivot. Uses grab bar for assistance, adjusts own clothing and performs hygiene. Incision R shoulder steri strips with dry dressing, site and dressing clean dry intact. +2 bilateral lower extremity edema, extremities elevated in bed. No complaints of pain today. Continue with plan of care.

## 2011-07-16 NOTE — Progress Notes (Signed)
Social Work Discharge Note Discharge Note  The overall goal for the admission was met for:   Discharge location: Yes-HOME WITH HUSBAND WHO CAN PHYSICALLY ASSIST  Length of Stay: Yes-6 DAYS  Discharge activity level: Yes-SUPERVISION-MINIMAL ASSIST Home/community participation: Yes  Services provided included: MD, RD, PT, OT, RN, CM, TR, Pharmacy and SW  Financial Services: Private Insurance: BLUE MEDICARE  Follow-up services arranged: Home Health: [facility]- PT,OT,RN  Comments (or additional information):[facility]-30 TRANSFER BOARD GETTING POWER WHEELCHAIR ONCE HOME FROM [facility]  Patient/Family verbalized understanding of follow-up arrangements: Yes  Individual responsible for coordination of the follow-up plan: SELF AND [place] Lathe  Confirmed correct DME delivered: Lucy Chris 07/16/2011    Lucy Chris

## 2011-07-16 NOTE — Progress Notes (Signed)
Physical Therapy Treatment Note  Patient Details  Name: Haley Kennedy MRN: [id number] Date of Birth: Feb 07, 1946 Today's Date: 07/16/2011  [place] for Home Evaluation (1000-1130) with OT. PT 45 minutes.   Home evaluation completed for home safety, family education, and home management recommendations. Please see Home Evaluation Checklist in soft chart for complete details. Family education with pt and pt's husband was successfully completed with return demonstration for all home transfer scenarios. Also therapists and husband walked through new house and reviewed modifications for grab bars and home safety.   Karolee Stamps [facility] 07/16/2011, 12:55 PM

## 2011-07-17 NOTE — Patient Care Conference (Signed)
Inpatient RehabilitationTeam Conference Note Date: 07/17/2011   Time: 1:01 PM    Patient Name: Haley Kennedy      Medical Record Number: [id number]  Date of Birth: 1945-10-24 Sex: Female         Room/Bed: 4010/4010-01 Payor Info: Payor: [employer] [place] MEDICARE  Plan: BLUE MEDICARE  Product Type: *No Product type*     Admitting Diagnosis: (R) SHOULDER ARTHROSCOPY  Admit Date/Time:  07/11/2011  4:10 PM Admission Comments: No comment available   Primary Diagnosis:  Right rotator cuff tear Principal Problem: Right rotator cuff tear  Patient Active Problem List  Diagnoses Date Noted  . Right rotator cuff tear 07/12/2011  . Rotator cuff (capsule) sprain 07/09/2011    Expected Discharge Date:  07/17/11  Team Members Present:       Current Status/Progress Goal Weekly Team Focus  Medical   Right shoulder is healing nicely. She's limited pendulum exercises only. 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  Improved activity tolerance  Safety education and home safety eval   Bowel/Bladder   continent bowel and bladder, lbm 11/18  mod independent with bowel/bladder management  remain continent   Swallow/Nutrition/ Hydration             ADL's   min A UB dressing, mod A bathing, total A LB dressing, max A toileting, min A toilet transfers  overall mod A; except min A toilet transfers and Supervision UB dressing  family education(husband verbalized and demonstrated safety and competentacy with ADL assistance), home evaluation completed for safe d/c to home   Mobility   minA overall transfers , mod I w/c mobility and bed mobility, husband return demo A with transfers  minA overall transfers, mod I w/c mobility and bed mobility  d/c -family ed completed and home eval completed   Communication             Safety/Cognition/ Behavioral Observations            Pain   complaints of pain in right shoulder  pain < 3  manage pain  with prn pain medications   Skin   steri strips to right shoulder incision  no new breakdown  educate pt on incision and skin care management      *See Interdisciplinary Assessment and Plan and progress notes for long and short-term goals  Barriers to Discharge: None at this point    Possible Resolutions to Barriers:       Discharge Planning/Teaching Needs:  Home with husband, he assisted her prior to admission. Home eval went well ready for discharge with home health.      Team Discussion:  See completed TC form.  Revisions to Treatment Plan:     Continued Need for Acute Rehabilitation Level of Care: The patient requires daily medical management by a physician with specialized training in physical medicine and rehabilitation for the following conditions: Daily direction of a multidisciplinary physical rehabilitation program to ensure safe treatment while eliciting the highest outcome that is of practical value to the patient.: Yes Daily medical management of patient stability for increased activity during participation in an intensive rehabilitation regime.: Yes Daily analysis of laboratory values and/or radiology reports with any subsequent need for medication adjustment of medical intervention for : Cardiac problems;Neurological problems  Meryl Dare 07/17/2011, 1:01 PM

## 2011-07-17 NOTE — Progress Notes (Signed)
Physical Therapy Discharge Summary  Patient Details  Name: Haley Kennedy MRN: [id number] Date of Birth: 06-10-1946 Today's Date: 07/17/2011  Patient has met 7 of 7 long term goals due to improved activity tolerance, improved balance, improved postural control, ability to compensate for deficits and functional use of  right lower extremity, left upper extremity and left lower extremity.  Patient to discharge at a wheelchair level Supervision. Pt is using slideboard for car and level surface transfers with supervision/set up. Pt's husband able to provide physical A for min/modA for stand pivot transfers using grab bars in home environment or furniture transfers.  Patient's care partner is independent to provide the necessary physical assistance at discharge. Home evaluation was completed prior to d/c to establish safe home environment and family education with pt's husband who is to be primary caregiver. Home eval checklist was completed with appropriate recommendations made to pt and husband.  Recommendation:  Patient will benefit from ongoing skilled PT services in home health setting to continue to advance safe functional mobility, address ongoing impairments in balance, transfers, strength, gait impairments, sensation, and minimize fall risk.  Equipment: Equipment provided: slideboard, 20x18 standard w/c  Patient/family agrees with progress made and goals achieved: Yes  Treatment (130-865) session, individual therapy, no/co pain. Focused on car transfers with slideboard (S overall, set up to place slideboard) and basic bed/chair transfers with slideboard overall S. Pt given HEP for lower extremity strengthening and reviewed exercises with pt. W/c propulsion with bilateral LEs and LUE for general activity tolerance and functional mobility training. Demonstrated how to fold up/break down w/c for storage.  Karolee Stamps [place] 07/17/2011, 8:41 AM

## 2011-07-17 NOTE — Patient Care Conference (Signed)
Inpatient RehabilitationTeam Conference Note Date: 07/17/2011   Time: 1:07 PM    Patient Name: Haley Kennedy      Medical Record Number: [id number]  Date of Birth: Sep 22, 1945 Sex: Female         Room/Bed: 4010/4010-01 Payor Info: Payor: [employer] [place] MEDICARE  Plan: BLUE MEDICARE  Product Type: *No Product type*     Admitting Diagnosis: (R) SHOULDER ARTHROSCOPY  Admit Date/Time:  07/11/2011  4:10 PM Admission Comments: No comment available   Primary Diagnosis:  Right rotator cuff tear Principal Problem: Right rotator cuff tear  Patient Active Problem List  Diagnoses Date Noted  . Right rotator cuff tear 07/12/2011  . Rotator cuff (capsule) sprain 07/09/2011    Expected Discharge Date: Expected Discharge Date: 07/16/1210/20/12  Team Members Present: Physician: Dr. Faith Rogue Case Manager Present: Lutricia Horsfall, RN Social Worker Present: Dossie Der, LCSW PT Present: Karolee Stamps, PT OT Present: Edwin Cap, OT     Current Status/Progress Goal Weekly Team Focus  Medical   Right shoulder is healing nicely. She's limited pendulum exercises only. 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  Improved activity tolerance  Safety education and home safety eval   Bowel/Bladder   continent bowel and bladder, lbm 11/18  mod independent with bowel/bladder management  remain continent   Swallow/Nutrition/ Hydration             ADL's   min A UB dressing, mod A bathing, total A LB dressing, max A toileting, min A toilet transfers  overall mod A; except min A toilet transfers and Supervision UB dressing  family education(husband verbalized and demonstrated safety and competentacy with ADL assistance), home evaluation completed for safe d/c to home   Mobility   minA overall transfers , mod I w/c mobility and bed mobility, husband return demo A with transfers  minA overall transfers, mod I w/c mobility and bed  mobility  d/c -family ed completed and home eval completed   Communication             Safety/Cognition/ Behavioral Observations            Pain   complaints of pain in right shoulder  pain < 3  manage pain with prn pain medications   Skin   steri strips to right shoulder incision  no new breakdown  educate pt on incision and skin care management      *See Interdisciplinary Assessment and Plan and progress notes for long and short-term goals  Barriers to Discharge: None at this point    Possible Resolutions to Barriers:       Discharge Planning/Teaching Needs:  Home with husband, he assisted her prior to admission. Home eval went well ready for discharge with home health.      Team Discussion:  Pt has met goals. Home eval yesterday -- No barriers to d/c. Family ed complete. Team in agreement that pt is ready for d/c.  Revisions to Treatment Plan:     Continued Need for Acute Rehabilitation Level of Care: The patient requires daily medical management by a physician with specialized training in physical medicine and rehabilitation for the following conditions: Daily direction of a multidisciplinary physical rehabilitation program to ensure safe treatment while eliciting the highest outcome that is of practical value to the patient.: Yes Daily medical management of patient stability for increased activity during participation in an intensive rehabilitation regime.: Yes Daily analysis of laboratory values and/or radiology reports  with any subsequent need for medication adjustment of medical intervention for : Cardiac problems;Neurological problems  Meryl Dare 07/17/2011, 1:07 PM

## 2011-07-17 NOTE — Progress Notes (Signed)
Pt discharged home with husband. Discharge instructions discussed with Harvel Ricks, PA. Pt verbalized understanding of information provided. C/o of nausea from prior pain medication. Phenergan 12.5 mg given prior to discharge, per pt's request. Discussed with pt and husband, that medication could cause drowsiness. Pt escorted off unit in W/C with personal belonging.

## 2011-07-17 NOTE — Progress Notes (Signed)
Occupational Therapy Discharge Summary and Session Note  Patient Details  Name: Haley Kennedy MRN: [id number] Date of Birth: 01/26/1946 Today's Date: 07/17/2011  Session Note: Time: 1000-1040 Time Calculation: 40 minutes Individual Therapy No complaints of pain Upon entering room patient already dressed. Discussed safety aspects for safe discharge home. Patient able to verbalize understanding for aspects of daily living. Patient doffed and donned abductor sling independently and performed pendulum exercises & bicep exercises independently. Gave patient theraputty for hand strengthing exercises for discharge home.   Discharge Summary Patient has met 4 of 6 long term goals due to improved activity tolerance, improved balance and ability to compensate for deficits.  Patient's care partner is independent to provide the necessary physical assistance at discharge. Original estimated length of stay was set for 2-3 weeks upon evaluation and goals also set for a 2-3 week time frame. Per patient request, she wanted to discharge to home as soon as possible. Arranged a home visit/home evaluation with hands on return demonstration with w/c mobility and transfers with patients husband. Patient is adequate for directing care prn and husband, main caregiver, is adequate and safely completes transfers and assistance with aspects of daily living. Patient with limited functional use of RUE secondary to abductor sling bound and NWB status.  Recommendation:  Patient will continue to received OT services in a home health setting to continue to advance functional skills in the area of ADL, IADLs, and functional use of bilateral upper extremities.  Equipment: No equipment provided at this time. Patient has all necessary equipment at home.  Patient/family agrees with progress made and goals achieved: Yes  Hildegard Hlavac 07/17/2011, 10:27 AM

## 2011-07-17 NOTE — Progress Notes (Signed)
Social Work Patient ID: Haley Kennedy, female   DOB: Aug 27, 1946, 65 y.o.   MRN: [id number]   Met with pt and husband yesterday to discuss the need for a manuel wheelchair. Aware it would Be private pay. Found out the cost and pt and husband want to go ahead with purchasing one. They prefer [facility] since getting power wheelchair with them.  To deliver to room Prior to discharge.  They are aware and agreeable.  Ready for discharge tomorrow.

## 2011-07-17 NOTE — Progress Notes (Signed)
Subjective/Complaints: Anxious for discharge home today.Home eval went well. ROS negative.  Objective: Vital Signs: Blood pressure 109/76, pulse 68, temperature 97.6 F (36.4 C), temperature source Oral, resp. rate 19, height 5\' 6"  (1.676 m), weight 95.5 kg (210 lb 8.6 oz), SpO2 97.00%. No results found. No results found for this basename: WBC:2,HGB:2,HCT:2,PLT:2 in the last 72 hours No results found for this basename: NA:2,K:2,CL:2,CO2:2,GLUCOSE:2,BUN:2,CREATININE:2,CALCIUM:2 in the last 72 hours CBG (last 3)  No results found for this basename: GLUCAP:3 in the last 72 hours  Wt Readings from Last 3 Encounters:  07/11/11 95.5 kg (210 lb 8.6 oz)  07/05/11 96.1 kg (211 lb 13.8 oz)  07/05/11 96.1 kg (211 lb 13.8 oz)    Physical Exam:  General appearance: alert and fatigued  Head: Normocephalic, without obvious abnormality, atraumatic  Eyes: conjunctivae/corneas clear. PERRL, EOM's intact. Fundi benign.  Ears: normal TM's and external ear canals both ears  Nose: Nares normal. Septum midline. Mucosa normal. No drainage or sinus tenderness.  Throat: lips, mucosa, and tongue normal; teeth and gums normal  Neck: no adenopathy, no carotid bruit, no JVD, supple, symmetrical, trachea midline and thyroid not enlarged, symmetric, no tenderness/mass/nodules  Back: symmetric, no curvature. ROM normal. No CVA tenderness. back generally tender with ROM  Resp: clear to auscultation bilaterally  Cardio: regular rate and rhythm, S1, S2 normal, no murmur, click, rub or gallop  GI: soft, non-tender; bowel sounds normal; no masses, no organomegaly  Extremities: edema 1+ bilateral pretibial  Pulses: 2+ and symmetric  Skin: Skin color, texture, turgor normal. No rashes or lesions  Neurologic: bilateral foot drop and diminished PP and LT below waist.  Incision/Wound: wound clean dry and intact. Right UE neurovascularly intact      Assessment/Plan: 1. Functional deficits secondary to right rotator  cuff injury/repair with hx of charcot marie tooth which require 3+ hours per day of interdisciplinary therapy in a comprehensive inpatient rehab setting. Physiatrist is providing close team supervision and 24 hour management of active medical problems listed below. Physiatrist and rehab team continue to assess barriers to discharge/monitor patient progress toward functional and medical goals.  Home safety eval successful yesterday.  For D/C today Mobility: Bed Mobility Supine to Sit: 3: Mod assist Sitting - Scoot to Edge of Bed: 5: Supervision Sit to Supine - Right: 4: Min assist;HOB flat;With rail Transfers Transfers: Yes Sit to Stand: 3: Mod assist;From elevated surface;From bed;With upper extremity assist;From chair/3-in-1 Stand to Sit: 3: Mod assist Stand Pivot Transfers: 3: Mod assist Stand Pivot Transfer Details (indicate cue type and reason): Stand pivot transfers w/c <> Nustep with husband demonstrating mod A to lift patient's hips but patient performing pivoting with one UE support; also performed car transfer training x 2 reps with cues for patient hand placement, safe sequence.  Pt husband performed lifting of patient's hips from chair while patient performed pivot to and from elevated simulated van.  Discussed various options and safety concerns with transfer and importance of practicing with actual Zenaida Niece or truck.  On final transfer patient stood from elevated "van" and pivoted supervision.   Lateral/Scoot Transfers: 3: Mod assist;1: +2 Total assist (+2 to hold w/c for safety) Ambulation/Gait Ambulation/Gait Assistance: Not tested (comment) Stairs: No (unsafe to attempt at this time -pregait only) Naval architect Mobility: Yes Wheelchair Assistance: 1: +1 Total assist (pt reports using BLEs to propel w/c at home, unable at eval) ADL:   Cognition: Cognition Overall Cognitive Status: Appears within functional limits for tasks assessed Arousal/Alertness:  Awake/alert Orientation  Level: Oriented X4 Safety/Judgment:  (decreased safety with mobility) Cognition Arousal/Alertness: Awake/alert Orientation Level: Oriented X4  1.Rotator cuff tear s/p open rotator cuff repair and distal clavicle acrominectomy.Shoulder sling. Follow up with Dr. Lieutenant Diego marie tooth-AFO"s as prior to admission. I WOULD LIKE TO SEE HER BACK IN THE OFFICE IN January FOR EVAL OF AFO'S 3.DVT prophylaxis-discontinue lovenox/no bleeding episodes. 4.pain management-norco,robaxin-good pain control. 5.Htn-lasix,toprol.No orthostasis 6.constipation-laxative assist with good results. 2. DVT Prophylaxis/Anticoagulation: Mechanical:  Antiembolism stockings, knee (TED hose) Bilateral lower extremities  3. Pain Management: Adequate control     ELOS (Days) 6  ANGIULLI,DANIEL J. 07/17/2011, 6:10 AM        Subjective/Complaints: Sleeping better.  Anxious to go home.  Home safety eval today. Otherwise ROS negative.  Objective: Vital Signs: Blood pressure 109/76, pulse 68, temperature 97.6 F (36.4 C), temperature source Oral, resp. rate 19, height 5\' 6"  (1.676 m), weight 95.5 kg (210 lb 8.6 oz), SpO2 97.00%. No results found. No results found for this basename: WBC:2,HGB:2,HCT:2,PLT:2 in the last 72 hours No results found for this basename: NA:2,K:2,CL:2,CO2:2,GLUCOSE:2,BUN:2,CREATININE:2,CALCIUM:2 in the last 72 hours CBG (last 3)  No results found for this basename: GLUCAP:3 in the last 72 hours  Wt Readings from Last 3 Encounters:  07/11/11 95.5 kg (210 lb 8.6 oz)  07/05/11 96.1 kg (211 lb 13.8 oz)  07/05/11 96.1 kg (211 lb 13.8 oz)   BP Readings from Last 3 Encounters:  07/16/11 109/76  07/11/11 98/61  07/11/11 98/61    Physical Exam:  General appearance: alert and fatigued Head: Normocephalic, without obvious abnormality, atraumatic Eyes: conjunctivae/corneas clear. PERRL, EOM's intact. Fundi benign. Ears: normal TM's and external ear canals  both ears Nose: Nares normal. Septum midline. Mucosa normal. No drainage or sinus tenderness. Throat: lips, mucosa, and tongue normal; teeth and gums normal Neck: no adenopathy, no carotid bruit, no JVD, supple, symmetrical, trachea midline and thyroid not enlarged, symmetric, no tenderness/mass/nodules Back: symmetric, no curvature. ROM normal. No CVA tenderness. back generally tender with ROM Resp: clear to auscultation bilaterally Cardio: regular rate and rhythm, S1, S2 normal, no murmur, click, rub or gallop GI: soft, non-tender; bowel sounds normal; no masses,  no organomegaly Extremities: edema 1+  bilateral pretibial  Pulses: 2+ and symmetric Skin: Skin color, texture, turgor normal. No rashes or lesions Neurologic: bilateral foot drop and diminished PP and LT below waist. Incision/Wound: wound clean dry and intact.  Right UE neurovascularly intact   Assessment/Plan: 1. Functional deficits secondary to right rotator cuff injury/repair with hx of charcot marie tooth which require 3+ hours per day of interdisciplinary therapy in a comprehensive inpatient rehab setting. Physiatrist is providing close team supervision and 24 hour management of active medical problems listed below. Physiatrist and rehab team continue to assess barriers to discharge/monitor patient progress toward functional and medical goals.  Team working on Personal assistant with patient and husband.  Mobility: Bed Mobility Supine to Sit: 3: Mod assist Sitting - Scoot to Edge of Bed: 5: Supervision Sit to Supine - Right: 4: Min assist;HOB flat;With rail Transfers Transfers: Yes Sit to Stand: 3: Mod assist;From elevated surface;From bed;With upper extremity assist;From chair/3-in-1 Stand to Sit: 3: Mod assist Stand Pivot Transfers: 3: Mod assist Stand Pivot Transfer Details (indicate cue type and reason): Stand pivot transfers w/c <> Nustep with husband demonstrating mod A to lift patient's hips but patient  performing pivoting with one UE support; also performed car transfer training x 2 reps with cues for patient hand placement, safe sequence.  Pt husband performed lifting of patient's hips from chair while patient performed pivot to and from elevated simulated van.  Discussed various options and safety concerns with transfer and importance of practicing with actual Zenaida Niece or truck.  On final transfer patient stood from elevated "van" and pivoted supervision.   Lateral/Scoot Transfers: 3: Mod assist;1: +2 Total assist (+2 to hold w/c for safety) Ambulation/Gait Ambulation/Gait Assistance: Not tested (comment) Stairs: No (unsafe to attempt at this time -pregait only) Naval architect Mobility: Yes Wheelchair Assistance: 1: +1 Total assist (pt reports using BLEs to propel w/c at home, unable at eval) ADL:   Cognition: Cognition Overall Cognitive Status: Appears within functional limits for tasks assessed Arousal/Alertness: Awake/alert Orientation Level: Oriented X4 Safety/Judgment:  (decreased safety with mobility) Cognition Arousal/Alertness: Awake/alert Orientation Level: Oriented X4   Medical Problem List:  1.Rotator cuff tear s/p open rotator cuff repair and distal clavicle acrominectomy. Shoulder sling. Ok for pendelum exercises.  2.Charcot marie tooth-consertive care. AFO"s prior to admission. Patient may need more supportive/longer AFOs. It's probably not the most ideal time to change these given her rotator cuff injury. We'll observe gait pattern with therapy. A lot of her problems with transfers and mobility are related to her proximal sensory loss. Her strength is reasonable in her proximal legs and they should be strong enough to support her typically in gait or transfers.  3.Dvt prophylaxis-lovenox. Check platelets and follow for any signs or symptoms of bleeding complications.  4.pain management-norco,robaxin.Monitor with activity. She denies any significant pain  complaints at present. Having some difficulties at night because she is unable to turn in bed 5.Htn-lasix , toprol daily.Monitor with increased activity  6.constipation-laxative assist. Nighttime stool softener/laxative.  7.mood-ego support and address insomnia  schedule dazodone at night very helpful. 8. ABLA: dietary Fe++, hgb trending up      07/17/2011, 6:11 AM

## 2011-08-31 ENCOUNTER — Encounter: Payer: Medicare Other | Admitting: Physical Medicine & Rehabilitation

## 2011-09-12 ENCOUNTER — Encounter: Payer: Self-pay | Admitting: Internal Medicine

## 2011-09-12 ENCOUNTER — Ambulatory Visit (INDEPENDENT_AMBULATORY_CARE_PROVIDER_SITE_OTHER): Payer: Medicare Other | Admitting: Internal Medicine

## 2011-09-12 DIAGNOSIS — J984 Other disorders of lung: Secondary | ICD-10-CM

## 2011-09-12 DIAGNOSIS — R911 Solitary pulmonary nodule: Secondary | ICD-10-CM | POA: Insufficient documentation

## 2011-09-12 NOTE — Patient Instructions (Signed)
Benign Positional Vertigo Vertigo is a feeling that you are unsteady or that you or your surroundings are moving. Benign positional vertigo (BPV) is the most common form of vertigo. Benign means it does not have a serious cause. BPV is an upset in the balance system in your middle ear. This is troublesome but usually not serious. A viral infection or head injury are common causes, but often no cause is found. It is more common as we grow older. SYMPTOMS   Sudden dizziness happens when you move your head in different directions. Some of the problems that come with this are:  Loss of balance.     Throwing up (vomiting).     Blurred vision.     Dizziness .     Feeling sick to your stomach (nauseated).  DIAGNOSIS   Your caregiver may do some specialized testing to prove what is wrong. HOME CARE INSTRUCTIONS    Rest and eat a well-balanced diet.     Move slowly and do not make sudden body or head movements.     Do not drive a car or do any activities that could hurt you or others.     Lie down and rest. Take precautions to prevent falls.  SEEK IMMEDIATE MEDICAL CARE IF:    You develop headaches which are severe or lasting.     You develop continued vomiting.     A temporary loss or change of vision appears.     You notice temporary numbness on one side of your body.     You are temporarily unable to speak.     Temporary areas of weakness develop.     You have weakness or numbness in the face, arms, or legs.     You notice dizziness or difficulty walking.     You experience slurred speech or difficulty swallowing.  MAKE SURE YOU:    Understand these instructions.     Will watch your condition.     Will get help right away if you are not doing well or get worse.  Document Released: 05/21/2006 Document Revised: 02/26/2011 Document Reviewed: 05/30/2006 [employer] Patient Information 2012 [employer]. 

## 2011-09-12 NOTE — Progress Notes (Signed)
  Subjective:    Patient ID: Haley Kennedy, female    DOB: 1945/10/19, 66 y.o.   MRN: [id number]  HPI  66 year old patient who has had fairly recent right rotator cuff repair. She is still undergoing rehabilitation and receiving home physical therapy. She presents with a two-day history of positional vertigo. This began yesterday when she medicine On the head and neck and has been aggravated by change in position. Symptoms are much improved today. She also describes some mild headaches as well as rhinorrhea.  While hospitalized she had a chest x-ray that revealed a possible pulmonary nodule. A chest CT revealed  no pathology in the area of concern from the plain radiograph;  however there was a 3 mm small nodule noted. The patient is considered low risk but will consider for a followup chest CT in November of this year    Review of Systems  Constitutional: Negative.   HENT: Positive for congestion and rhinorrhea. Negative for hearing loss, sore throat, dental problem, sinus pressure and tinnitus.   Eyes: Negative for pain, discharge and visual disturbance.  Respiratory: Negative for cough and shortness of breath.   Cardiovascular: Negative for chest pain, palpitations and leg swelling.  Gastrointestinal: Negative for nausea, vomiting, abdominal pain, diarrhea, constipation, blood in stool and abdominal distention.  Genitourinary: Negative for dysuria, urgency, frequency, hematuria, flank pain, vaginal bleeding, vaginal discharge, difficulty urinating, vaginal pain and pelvic pain.  Musculoskeletal: Negative for joint swelling, arthralgias and gait problem.  Skin: Negative for rash.  Neurological: Positive for light-headedness. Negative for dizziness, syncope, speech difficulty, weakness, numbness and headaches.  Hematological: Negative for adenopathy.  Psychiatric/Behavioral: Negative for behavioral problems, dysphoric mood and agitation. The patient is not nervous/anxious.        Objective:     Physical Exam  Constitutional: She is oriented to person, place, and time. She appears well-developed and well-nourished. No distress.  HENT:  Head: Normocephalic.  Left Ear: External ear normal.  Nose: Nose normal.  Mouth/Throat: Oropharynx is clear and moist.  Neurological: She is alert and oriented to person, place, and time. She has normal reflexes.       No pathological nystagmus  No cerebellar signs          Assessment & Plan:  Benign positional vertigo. Patient is already much improved today we'll suggest a nonsedating antihistamine and observe  History of 3 mm left lower lobe pulmonary nodule. Patient is at low risk but we'll still consider followup chest CT in November of this year

## 2011-09-28 DIAGNOSIS — M7530 Calcific tendinitis of unspecified shoulder: Secondary | ICD-10-CM

## 2011-09-28 DIAGNOSIS — R5381 Other malaise: Secondary | ICD-10-CM

## 2011-09-28 DIAGNOSIS — IMO0002 Reserved for concepts with insufficient information to code with codable children: Secondary | ICD-10-CM

## 2011-10-01 ENCOUNTER — Encounter: Payer: Medicare Other | Attending: Physical Medicine & Rehabilitation | Admitting: Physical Medicine & Rehabilitation

## 2011-10-01 DIAGNOSIS — R29898 Other symptoms and signs involving the musculoskeletal system: Secondary | ICD-10-CM | POA: Insufficient documentation

## 2011-10-01 DIAGNOSIS — G600 Hereditary motor and sensory neuropathy: Secondary | ICD-10-CM | POA: Insufficient documentation

## 2011-10-01 DIAGNOSIS — M7989 Other specified soft tissue disorders: Secondary | ICD-10-CM | POA: Insufficient documentation

## 2011-10-01 DIAGNOSIS — S46909A Unspecified injury of unspecified muscle, fascia and tendon at shoulder and upper arm level, unspecified arm, initial encounter: Secondary | ICD-10-CM | POA: Insufficient documentation

## 2011-10-01 DIAGNOSIS — S4980XA Other specified injuries of shoulder and upper arm, unspecified arm, initial encounter: Secondary | ICD-10-CM | POA: Insufficient documentation

## 2011-10-01 DIAGNOSIS — X58XXXA Exposure to other specified factors, initial encounter: Secondary | ICD-10-CM | POA: Insufficient documentation

## 2011-10-01 NOTE — Assessment & Plan Note (Signed)
Haley Kennedy is back after her rehab stay.  She was with [place] after a fall with a rotator cuff tear and retraction.  She underwent a right shoulder arthroscopic surgery with rotator cuff repair and distal clavicle and acromionectomy.  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 She complains of being persistently weak.  She has seen 2 different orthotist regarding different brace wear and they really felt that she would not benefit from a change in AFO.  She is limited still with shoulder movement per Dr. Ranell Patrick and is only allowed to put a 1 pound weight on the shoulder.  She is not doing therapy at this point, but she is doing the new step on her own.  REVIEW OF SYSTEMS:  Notable for the above.  Full 12-point review is in the written health and history section of the chart.  Does report some low back pain.  She does feel that she is weak in her proximal legs as well.  She has swelling in both legs which is chronic.  SOCIAL HISTORY:  The patient is married, living with her husband who is present with her today and appears supportive.  He has to help her with lot of her transfers currently due to her weightbearing issues.  PHYSICAL EXAMINATION:  VITAL SIGNS:  Blood pressure is 105/44, pulse 62, respiratory rate is 16 and she is satting 99% on room air. GENERAL:  The patient is pleasantly alert and appropriate.  She attempts to all my questions and has good insight and awareness. EXTREMITIES:  Strength is near 4-5/5 left upper extremity.  I did not push the right upper extremity  given her surgery.  Hip flexion is 2+ on the right and 3/5 on the left.  Hamstrings are 2+ bilaterally. Quadriceps are 3+ on the left, 2+ on the right.  She has no active plantar flexion, dorsiflexion on either leg.  She has diminished sensory function below both knees.  ASSESSMENT: 1. Recent right rotator cuff injury with repair. 2.  Charcot-Marie-Tooth syndrome 3. History of low back surgery and possible radiculopathy related to     her chronic back problem.  PLAN: 1. I think a lot of the patient's lower extremity weakness is not due     to advancement of any neurological process, but more a matter of     her lack of overall mobility given her orthopedic injuries.  I     think she could be a candidate for aquatic based therapy to improve     range and to buildup some strength.  However, she needs to be     cleared from a surgical standpoint to pursue this.  I told the     patient that she will need to speak with Dr. Ranell Patrick regarding     advancement and this process still may take a few more weeks at     least.  We will wait for a cue from him to advance her activity     levels as a whole.  For now, we discussed appropriate exercise     habits.  She needs to avoid locking her knees when she extends and     this maybe taking things at a bit of a slower pace, with poor     control.  Her hamstrings do not appear to control knee extension to     a great extent and that is likely a lot of her problem there  2. I will see the patient back here in about 3-4 months' time.  She     will call me with any problems or questions in the interim.     Ranelle Oyster, M.D. Electronically Signed    ZTS/MedQ D:  10/01/2011 13:00:41  T:  10/01/2011 19:36:47  Job #:  [id number]  cc:   Almedia Balls. Ranell Patrick, M.D. Fax: [phone]

## 2011-12-17 ENCOUNTER — Telehealth: Payer: Self-pay | Admitting: *Deleted

## 2011-12-17 DIAGNOSIS — R2689 Other abnormalities of gait and mobility: Secondary | ICD-10-CM

## 2011-12-17 DIAGNOSIS — R2681 Unsteadiness on feet: Secondary | ICD-10-CM

## 2011-12-17 NOTE — Telephone Encounter (Signed)
Last seen 08/2011 - please advise

## 2011-12-17 NOTE — Telephone Encounter (Signed)
Patient is calling because she would like a referral for physical therapy because of difficult gait , because she is having a hard time walking after surgery.  She would like if possible to go to [facility] in [place]. The fax number is [phone].

## 2011-12-18 NOTE — Telephone Encounter (Signed)
ok 

## 2011-12-18 NOTE — Telephone Encounter (Signed)
done

## 2011-12-20 ENCOUNTER — Ambulatory Visit: Payer: Medicare Other | Attending: Internal Medicine | Admitting: Physical Therapy

## 2011-12-20 DIAGNOSIS — IMO0001 Reserved for inherently not codable concepts without codable children: Secondary | ICD-10-CM | POA: Insufficient documentation

## 2011-12-20 DIAGNOSIS — R269 Unspecified abnormalities of gait and mobility: Secondary | ICD-10-CM | POA: Insufficient documentation

## 2011-12-20 DIAGNOSIS — M6281 Muscle weakness (generalized): Secondary | ICD-10-CM | POA: Insufficient documentation

## 2011-12-24 ENCOUNTER — Ambulatory Visit: Payer: Medicare Other | Admitting: *Deleted

## 2011-12-26 ENCOUNTER — Ambulatory Visit: Payer: Medicare Other | Admitting: Physical Therapy

## 2011-12-31 ENCOUNTER — Encounter: Payer: Medicare Other | Admitting: Physical Medicine & Rehabilitation

## 2011-12-31 ENCOUNTER — Ambulatory Visit: Payer: Medicare Other | Attending: Internal Medicine | Admitting: Physical Therapy

## 2011-12-31 DIAGNOSIS — M6281 Muscle weakness (generalized): Secondary | ICD-10-CM | POA: Insufficient documentation

## 2011-12-31 DIAGNOSIS — R269 Unspecified abnormalities of gait and mobility: Secondary | ICD-10-CM | POA: Insufficient documentation

## 2011-12-31 DIAGNOSIS — IMO0001 Reserved for inherently not codable concepts without codable children: Secondary | ICD-10-CM | POA: Insufficient documentation

## 2012-01-03 ENCOUNTER — Ambulatory Visit: Payer: Medicare Other | Admitting: Physical Therapy

## 2012-01-07 ENCOUNTER — Ambulatory Visit: Payer: Medicare Other | Admitting: Physical Therapy

## 2012-01-10 ENCOUNTER — Ambulatory Visit: Payer: Medicare Other | Admitting: Physical Therapy

## 2012-01-14 ENCOUNTER — Ambulatory Visit: Payer: Medicare Other | Admitting: Physical Therapy

## 2012-01-17 ENCOUNTER — Ambulatory Visit: Payer: Medicare Other | Admitting: Physical Therapy

## 2012-01-24 ENCOUNTER — Ambulatory Visit: Payer: Medicare Other | Admitting: Physical Therapy

## 2012-01-31 ENCOUNTER — Ambulatory Visit: Payer: Medicare Other | Attending: Internal Medicine | Admitting: *Deleted

## 2012-01-31 DIAGNOSIS — IMO0001 Reserved for inherently not codable concepts without codable children: Secondary | ICD-10-CM | POA: Insufficient documentation

## 2012-01-31 DIAGNOSIS — M6281 Muscle weakness (generalized): Secondary | ICD-10-CM | POA: Insufficient documentation

## 2012-01-31 DIAGNOSIS — R269 Unspecified abnormalities of gait and mobility: Secondary | ICD-10-CM | POA: Insufficient documentation

## 2012-02-04 ENCOUNTER — Encounter: Payer: Medicare Other | Admitting: Physical Therapy

## 2012-02-07 ENCOUNTER — Ambulatory Visit: Payer: Medicare Other | Admitting: *Deleted

## 2012-02-11 ENCOUNTER — Ambulatory Visit: Payer: Medicare Other | Admitting: *Deleted

## 2012-02-14 ENCOUNTER — Encounter: Payer: Medicare Other | Admitting: Physical Therapy

## 2012-02-25 ENCOUNTER — Encounter: Payer: Self-pay | Admitting: Internal Medicine

## 2012-02-25 ENCOUNTER — Ambulatory Visit (INDEPENDENT_AMBULATORY_CARE_PROVIDER_SITE_OTHER): Payer: Medicare Other | Admitting: Internal Medicine

## 2012-02-25 VITALS — BP 120/74 | HR 70 | Temp 97.4°F | Wt 210.0 lb

## 2012-02-25 DIAGNOSIS — G600 Hereditary motor and sensory neuropathy: Secondary | ICD-10-CM

## 2012-02-25 DIAGNOSIS — G629 Polyneuropathy, unspecified: Secondary | ICD-10-CM | POA: Insufficient documentation

## 2012-02-25 DIAGNOSIS — M549 Dorsalgia, unspecified: Secondary | ICD-10-CM | POA: Insufficient documentation

## 2012-02-25 DIAGNOSIS — G8929 Other chronic pain: Secondary | ICD-10-CM

## 2012-02-25 DIAGNOSIS — G609 Hereditary and idiopathic neuropathy, unspecified: Secondary | ICD-10-CM

## 2012-02-25 NOTE — Progress Notes (Signed)
  Subjective:    Patient ID: Haley Kennedy, female    DOB: 12-27-1945, 66 y.o.   MRN: [id number]  HPI  66 year old patient who has a complex past medical history. She has a history of Charcot Marie tooth with peripheral neuropathy. She is nonambulatory she has chronic low back pain and has had 2 back surgeries. He's also had a number of surgeries involving her feet and ankles. Her latest surgery was for a right rotator cuff tear in November of last year. Her chief complaint today is worsening lower extremity edema;  she has been on Lasix in the past but she states that she takes this daily he controls his swelling but she feels poorly. No pulmonary complaints.  Wt Readings from Last 3 Encounters:  02/25/12 210 lb (95.255 kg)  07/11/11 210 lb 8.6 oz (95.5 kg)  07/05/11 211 lb 13.8 oz (96.1 kg)    Review of Systems  Constitutional: Negative.   HENT: Negative for hearing loss, congestion, sore throat, rhinorrhea, dental problem, sinus pressure and tinnitus.   Eyes: Negative for pain, discharge and visual disturbance.  Respiratory: Negative for cough and shortness of breath.   Cardiovascular: Positive for leg swelling. Negative for chest pain and palpitations.  Gastrointestinal: Negative for nausea, vomiting, abdominal pain, diarrhea, constipation, blood in stool and abdominal distention.  Genitourinary: Negative for dysuria, urgency, frequency, hematuria, flank pain, vaginal bleeding, vaginal discharge, difficulty urinating, vaginal pain and pelvic pain.  Musculoskeletal: Positive for back pain and gait problem. Negative for joint swelling and arthralgias.  Skin: Negative for rash.  Neurological: Negative for dizziness, syncope, speech difficulty, weakness, numbness and headaches.  Hematological: Negative for adenopathy.  Psychiatric/Behavioral: Negative for behavioral problems, dysphoric mood and agitation. The patient is not nervous/anxious.        Objective:   Physical Exam    Constitutional: She is oriented to person, place, and time. She appears well-developed and well-nourished.       Weight 210 blood pressure 120/74   HENT:  Head: Normocephalic.  Right Ear: External ear normal.  Left Ear: External ear normal.  Mouth/Throat: Oropharynx is clear and moist.  Eyes: Conjunctivae and EOM are normal. Pupils are equal, round, and reactive to light.  Neck: Normal range of motion. Neck supple. No thyromegaly present.  Cardiovascular: Normal rate, regular rhythm, normal heart sounds and intact distal pulses.   Pulmonary/Chest: Effort normal and breath sounds normal. No respiratory distress. She has no wheezes. She has no rales.       O2 saturation 98%  Abdominal: Soft. Bowel sounds are normal. She exhibits no mass. There is no tenderness.  Musculoskeletal: Normal range of motion. She exhibits edema.       No flexion or extension of either ankle +2 ankle and foot edema  Lymphadenopathy:    She has no cervical adenopathy.  Neurological: She is alert and oriented to person, place, and time.  Skin: Skin is warm and dry. No rash noted.  Psychiatric: She has a normal mood and affect. Her behavior is normal.          Assessment & Plan:   Lower extremity edema. Low-salt diet recommended. She will try a regimen of furosemide Monday Wednesday Friday only Chronic low back pain Charcot-Marie-Tooth disease

## 2012-02-25 NOTE — Patient Instructions (Signed)
Limit your sodium (Salt) intake  Keep legs elevated as much as possible  Furosemide Monday Wednesday Friday

## 2012-06-29 ENCOUNTER — Ambulatory Visit (INDEPENDENT_AMBULATORY_CARE_PROVIDER_SITE_OTHER): Payer: Medicare Other | Admitting: Family Medicine

## 2012-06-29 VITALS — BP 115/76 | HR 66 | Temp 97.4°F | Resp 16

## 2012-06-29 DIAGNOSIS — R319 Hematuria, unspecified: Secondary | ICD-10-CM

## 2012-06-29 DIAGNOSIS — R300 Dysuria: Secondary | ICD-10-CM

## 2012-06-29 DIAGNOSIS — K5909 Other constipation: Secondary | ICD-10-CM

## 2012-06-29 DIAGNOSIS — R509 Fever, unspecified: Secondary | ICD-10-CM

## 2012-06-29 DIAGNOSIS — R350 Frequency of micturition: Secondary | ICD-10-CM

## 2012-06-29 DIAGNOSIS — N390 Urinary tract infection, site not specified: Secondary | ICD-10-CM

## 2012-06-29 LAB — POCT URINALYSIS DIPSTICK
Bilirubin, UA: NEGATIVE
Glucose, UA: NEGATIVE
Nitrite, UA: NEGATIVE
Protein, UA: 100
Spec Grav, UA: 1.025
Urobilinogen, UA: 1
pH, UA: 5.5

## 2012-06-29 LAB — POCT UA - MICROSCOPIC ONLY
Casts, Ur, LPF, POC: NEGATIVE
Crystals, Ur, HPF, POC: NEGATIVE
Epithelial cells, urine per micros: NEGATIVE
Mucus, UA: NEGATIVE
Yeast, UA: NEGATIVE

## 2012-06-29 MED ORDER — NITROFURANTOIN MONOHYD MACRO 100 MG PO CAPS: 100.0000 mg | ORAL_CAPSULE | Freq: Two times a day (BID) | ORAL | Status: DC

## 2012-06-29 NOTE — Patient Instructions (Addendum)
Urinary Tract Infection Urinary tract infections (UTIs) can develop anywhere along your urinary tract. Your urinary tract is your body's drainage system for removing wastes and extra water. Your urinary tract includes two kidneys, two ureters, a bladder, and a urethra. Your kidneys are a pair of bean-shaped organs. Each kidney is about the size of your fist. They are located below your ribs, one on each side of your spine. CAUSES Infections are caused by microbes, which are microscopic organisms, including fungi, viruses, and bacteria. These organisms are so small that they can only be seen through a microscope. Bacteria are the microbes that most commonly cause UTIs. SYMPTOMS  Symptoms of UTIs may vary by age and gender of the patient and by the location of the infection. Symptoms in young women typically include a frequent and intense urge to urinate and a painful, burning feeling in the bladder or urethra during urination. Older women and men are more likely to be tired, shaky, and weak and have muscle aches and abdominal pain. A fever may mean the infection is in your kidneys. Other symptoms of a kidney infection include pain in your back or sides below the ribs, nausea, and vomiting. DIAGNOSIS To diagnose a UTI, your caregiver will ask you about your symptoms. Your caregiver also will ask to provide a urine sample. The urine sample will be tested for bacteria and white blood cells. White blood cells are made by your body to help fight infection. TREATMENT  Typically, UTIs can be treated with medication. Because most UTIs are caused by a bacterial infection, they usually can be treated with the use of antibiotics. The choice of antibiotic and length of treatment depend on your symptoms and the type of bacteria causing your infection. HOME CARE INSTRUCTIONS  If you were prescribed antibiotics, take them exactly as your caregiver instructs you. Finish the medication even if you feel better after you  have only taken some of the medication.  Drink enough water and fluids to keep your urine clear or pale yellow.  Avoid caffeine, tea, and carbonated beverages. They tend to irritate your bladder.  Empty your bladder often. Avoid holding urine for long periods of time.  Empty your bladder before and after sexual intercourse.  After a bowel movement, women should cleanse from front to back. Use each tissue only once. SEEK MEDICAL CARE IF:   You have back pain.  You develop a fever.  Your symptoms do not begin to resolve within 3 days. SEEK IMMEDIATE MEDICAL CARE IF:   You have severe back pain or lower abdominal pain.  You develop chills.  You have nausea or vomiting.  You have continued burning or discomfort with urination. MAKE SURE YOU:   Understand these instructions.  Will watch your condition.  Will get help right away if you are not doing well or get worse. Document Released: 05/23/2005 Document Revised: 02/12/2012 Document Reviewed: 09/21/2011 Exit[employer] Patient Information 2013 [employer].  

## 2012-06-29 NOTE — Progress Notes (Signed)
@[email or web address] @   Patient ID: Haley Kennedy MRN: [id number], DOB: 08/09/1946, 66 y.o. Date of Encounter: 06/29/2012, 3:51 PM  Primary Physician: Rogelia Boga, MD  Chief Complaint:  Chief Complaint  Patient presents with  . Urinary Tract Infection    HPI: 66 y.o. year old female presents with 3 day history of dysuria, urgency, and frequency. Last UTI was never. Hematuria also present  No sick contacts, recent antibiotics, or recent travels.   No vaginal discharge, back pain, fever  Constipation chronically, patient recently disimpacted self and urinary symptoms commenced within hours.  Past Medical History  Diagnosis Date  . Ulcer   . Hyperlipidemia   . 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 Hyperlipidemia   . Shortness of breath     with exertion;pt states its bc shes not in shape   . Asthma     as a child  . Peripheral neuropathy   . Swelling     from knee down;takes furosemide daily  . Chronic back pain   . Arthritis     low back and neck  . Gastric ulcer   . Diverticulosis   . Rotator cuff (capsule) sprain 07/09/2011  . Allergy      Home Meds: Prior to Admission medications   Medication Sig Start Date End Date Taking? Authorizing Provider  cholecalciferol (VITAMIN D) 400 UNITS TABS Take by mouth.   Yes Historical Provider, MD  fish oil-omega-3 fatty acids 1000 MG capsule Take 2 g by mouth daily.   Yes Historical Provider, MD  furosemide (LASIX) 20 MG tablet Take 1 tablet (20 mg total) by mouth daily. 07/16/11 07/15/12 Yes Daniel J Angiulli, PA  Grape Seed 60 MG CAPS Take 1 capsule by mouth daily.     Yes Historical Provider, MD  OMEGA FATTY ACIDS-VITAMINS PO Take 2,126 mg by mouth 2 (two) times daily.     Yes Historical Provider, MD  OVER THE COUNTER MEDICATION Take 1 tablet by mouth 2 (two) times daily. USANA ANTIOXIDANT SUPPLEMENT    Yes Historical Provider, MD  OVER THE COUNTER MEDICATION Take 1,000 mg by mouth 2 (two) times daily.  VITACOST VITAMIN B3 SUPPLEMENT   Yes Historical Provider, MD  metoprolol (TOPROL-XL) 50 MG 24 hr tablet Take 25 mg by mouth daily.   06/15/11 06/14/12  Gordy Savers, MD    Allergies:  Allergies  Allergen Reactions  . Corticosteroids     CMT Disease  . Penicillins Other (See Comments)    Increased body temp  . 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 Vincristine     CMT disease  . Ciprofloxacin Rash    History   Social History  . Marital Status: Married    Spouse Name: N/A    Number of Children: N/A  . Years of Education: N/A   Occupational History  . Not on file.   Social History Main Topics  . Smoking status: Former Smoker    Quit date: 08/27/1978  . Smokeless tobacco: Never Used  . Alcohol Use: No  . Drug Use: No  . Sexually Active: No   Other Topics Concern  . Not on file   Social History Narrative  . No narrative on file     Review of Systems: Constitutional: negative for chills, fever, night sweats or weight changes Cardiovascular: negative for chest pain or palpitations Respiratory: negative for hemoptysis, wheezing, or shortness of breath  Abdominal: negative for abdominal pain, nausea, vomiting or diarrhea Dermatological: negative for rash Neurologic: negative for headache   Physical Exam: Blood pressure 115/76, pulse 66, temperature 97.4 F (36.3 C), temperature source Oral, resp. rate 16, SpO2 98.00%., There is no height or weight on file to calculate BMI. General: Well developed, well nourished, in no acute distress. Head: Normocephalic, atraumatic, eyes without discharge, sclera non-icteric, nares are congested. Bilateral auditory canals clear, TM's are without perforation, pearly grey with reflective cone of light bilaterally. Serous effusion bilaterally behind TM's. Maxillary sinus TTP. Oral cavity moist, dentition normal. Posterior pharynx with post nasal drip and mild erythema. No  peritonsillar abscess or tonsillar exudate. Neck: Supple. No thyromegaly. Full ROM. No lymphadenopathy. Lungs: Coarse breath sounds bilaterally without Clear bilaterally to auscultation without wheezes, rales, or rhonchi. Breathing is unlabored.  Heart: RRR with S1 S2. No murmurs, rubs, or gallops appreciated. Abdomen: Soft, non-tender, non-distended with normoactive bowel sounds. No hepatosplenomegaly. No rebound/guarding. No obvious abdominal masses. McBurney's, Rovsing's, Iliopsoas, and table jar all negative. Msk:  Strength and tone normal for age. Extremities: No clubbing or cyanosis. No edema. Neuro: Alert and oriented X 3. Moves all extremities spontaneously. CNII-XII grossly in tact. Psych:  Responds to questions appropriately with a normal affect.   Labs: Results for orders placed in visit on 06/29/12  POCT URINALYSIS DIPSTICK      Component Value Range   Color, UA brown     Clarity, UA cloudy     Glucose, UA neg     Bilirubin, UA neg     Ketones, UA trace     Spec Grav, UA 1.025     Blood, UA large     pH, UA 5.5     Protein, UA 100     Urobilinogen, UA 1.0     Nitrite, UA neg     Leukocytes, UA large (3+)    POCT UA - MICROSCOPIC ONLY      Component Value Range   WBC, Ur, HPF, POC tntc     RBC, urine, microscopic tntc     Bacteria, U Microscopic 4+     Mucus, UA neg     Epithelial cells, urine per micros neg     Crystals, Ur, HPF, POC neg     Casts, Ur, LPF, POC neg     Yeast, UA neg       ASSESSMENT AND PLAN:  66 y.o. year old female with acute UTI 1. Urine frequency  POCT urinalysis dipstick, POCT UA - Microscopic Only, nitrofurantoin, macrocrystal-monohydrate, (MACROBID) 100 MG capsule  2. UTI (lower urinary tract infection)  nitrofurantoin, macrocrystal-monohydrate, (MACROBID) 100 MG capsule  3. Hematuria  nitrofurantoin, macrocrystal-monohydrate, (MACROBID) 100 MG capsule    - -Mucinex -Tylenol/Motrin prn -Rest/fluids -RTC precautions -RTC 3-5 days  if no improvement  Signed, Elvina Sidle, MD 06/29/2012 3:51 PM

## 2012-07-18 ENCOUNTER — Telehealth: Payer: Self-pay | Admitting: Internal Medicine

## 2012-07-18 NOTE — Telephone Encounter (Signed)
Ok with me 

## 2012-07-18 NOTE — Telephone Encounter (Signed)
Pt would like to switch to [employer]. Pt has seen Dr Caryl Never in [place] and [place] years ago. Can I sch?

## 2012-07-21 NOTE — Telephone Encounter (Signed)
ok 

## 2012-07-22 NOTE — Telephone Encounter (Signed)
Pt is sch for dec 16

## 2012-08-11 ENCOUNTER — Ambulatory Visit (INDEPENDENT_AMBULATORY_CARE_PROVIDER_SITE_OTHER): Payer: Medicare Other | Admitting: Family Medicine

## 2012-08-11 ENCOUNTER — Encounter: Payer: Self-pay | Admitting: Family Medicine

## 2012-08-11 VITALS — BP 120/80 | Temp 97.5°F

## 2012-08-11 DIAGNOSIS — G629 Polyneuropathy, unspecified: Secondary | ICD-10-CM

## 2012-08-11 DIAGNOSIS — G609 Hereditary and idiopathic neuropathy, unspecified: Secondary | ICD-10-CM

## 2012-08-11 DIAGNOSIS — G600 Hereditary motor and sensory neuropathy: Secondary | ICD-10-CM

## 2012-08-11 DIAGNOSIS — Z9181 History of falling: Secondary | ICD-10-CM

## 2012-08-11 NOTE — Progress Notes (Signed)
Subjective:    Patient ID: Haley Kennedy, female    DOB: [birth year], 66 y.o.   MRN: [id number]  HPI  Patient transferring care. She has history of Charcot-Marie-Tooth disease, chronic back pain, peripheral neuropathy and history of rotator cuff tear. Patient relates she had right rotator cuff surgery in November of last year. She's had progressive decreased ambulation since then. Charcot-Marie-Tooth disease followed by neurologist in [place]. She also had a couple of low back surgeries. She's been nonambulatory for little over year. Had a few bouts of physical therapy but not recently. She has some diffuse lower extremity weakness and right upper extremity weakness. She felt her right upper extremity weakness following rotator cuff surgery has limited her use of walker. She is willing to consider physical therapy this time. She had gradual weight gain with her decreased mobility. She's very frustrated with her weight gain.  Only current prescription medications metoprolol. Takes very low dose 50 mg one half tablet daily. Blood pressures been stable. No physical in several years. Declines flu vaccine  Past Medical History  Diagnosis Date  . Ulcer   . Hyperlipidemia   . 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 Hyperlipidemia   . Shortness of breath     with exertion;pt states its bc shes not in shape   . Asthma     as a child  . Peripheral neuropathy   . Swelling     from knee down;takes furosemide daily  . Chronic back pain   . Arthritis     low back and neck  . Gastric ulcer   . Diverticulosis   . Rotator cuff (capsule) sprain 07/09/2011  . Allergy    Past Surgical History  Procedure Date  . Tonsillectomy   . Knee surgery   . Abdominal hysterectomy     66yrs ago  . Cholecystectomy     1997  . Foot surgery     1963  . Back surgery     after back surgery had to have iron infusion  . Back surgery     2009  . Spine surgery   . Fracture surgery      reports that she quit smoking about 33 years ago. She has never used smokeless tobacco. She reports that she does not drink alcohol or use illicit drugs. family history is not on file. Allergies  Allergen Reactions  . Corticosteroids     CMT Disease  . Penicillins Other (See Comments)    Increased body temp  . 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 Vincristine     CMT disease  . Ciprofloxacin Rash      Review of Systems  Constitutional: Positive for unexpected weight change. Negative for fever and activity change.  Respiratory: Negative for shortness of breath.   Cardiovascular: Positive for leg swelling (Chronic and stable). Negative for chest pain and palpitations.  Genitourinary: Negative for dysuria.  Neurological: Negative for dizziness and headaches.  Psychiatric/Behavioral: Negative for dysphoric mood.       Objective:   Physical Exam  Constitutional: She is oriented to person, place, and time. She appears well-developed and well-nourished.  Neck: Neck supple. No thyromegaly present.  Cardiovascular: Normal rate and regular rhythm.   Pulmonary/Chest: Effort normal and breath sounds normal. No respiratory distress. She has no wheezes. She has no rales.  Musculoskeletal: She exhibits edema.       Trace  pitting edema legs bilaterally. She has upper extremity mostly interosseous muscle atrophy  Neurological: She is alert and oriented to person, place, and time.          Assessment & Plan:  #1 Charcot-Marie-Tooth disease. Progressive weakness and inability to ambulate-?combination CMT disease, low back issues and Right rotator cuff issues. #2 history of right rotator cuff repair. Patient complains of some chronic right shoulder pain and weakness-set up PT. #3 chronic bilateral leg edema probably venous stasis related .  Exacerbated by inactivity. #4 history of mild hypertension currently stable  Set up physical  therapy. Schedule complete physical exam

## 2012-08-11 NOTE — Patient Instructions (Addendum)
We will call you regarding physical exam.

## 2012-08-25 ENCOUNTER — Ambulatory Visit: Payer: Medicare Other | Attending: Family Medicine | Admitting: Physical Therapy

## 2012-08-25 DIAGNOSIS — M25619 Stiffness of unspecified shoulder, not elsewhere classified: Secondary | ICD-10-CM | POA: Insufficient documentation

## 2012-08-25 DIAGNOSIS — R5381 Other malaise: Secondary | ICD-10-CM | POA: Insufficient documentation

## 2012-08-25 DIAGNOSIS — IMO0001 Reserved for inherently not codable concepts without codable children: Secondary | ICD-10-CM | POA: Insufficient documentation

## 2012-08-25 DIAGNOSIS — M25519 Pain in unspecified shoulder: Secondary | ICD-10-CM | POA: Insufficient documentation

## 2012-09-01 ENCOUNTER — Ambulatory Visit: Payer: Medicare Other | Attending: Family Medicine | Admitting: *Deleted

## 2012-09-01 DIAGNOSIS — M25619 Stiffness of unspecified shoulder, not elsewhere classified: Secondary | ICD-10-CM | POA: Insufficient documentation

## 2012-09-01 DIAGNOSIS — R5381 Other malaise: Secondary | ICD-10-CM | POA: Insufficient documentation

## 2012-09-01 DIAGNOSIS — M25519 Pain in unspecified shoulder: Secondary | ICD-10-CM | POA: Insufficient documentation

## 2012-09-01 DIAGNOSIS — IMO0001 Reserved for inherently not codable concepts without codable children: Secondary | ICD-10-CM | POA: Insufficient documentation

## 2012-09-03 ENCOUNTER — Ambulatory Visit: Payer: Medicare Other | Admitting: Physical Therapy

## 2012-09-08 ENCOUNTER — Ambulatory Visit: Payer: Medicare Other | Admitting: *Deleted

## 2012-09-15 ENCOUNTER — Ambulatory Visit: Payer: Medicare Other | Admitting: *Deleted

## 2012-09-17 ENCOUNTER — Ambulatory Visit: Payer: Medicare Other | Admitting: *Deleted

## 2012-09-22 ENCOUNTER — Ambulatory Visit: Payer: Medicare Other | Admitting: *Deleted

## 2012-09-29 ENCOUNTER — Ambulatory Visit: Payer: Medicare Other | Attending: Family Medicine | Admitting: *Deleted

## 2012-09-29 DIAGNOSIS — R5381 Other malaise: Secondary | ICD-10-CM | POA: Insufficient documentation

## 2012-09-29 DIAGNOSIS — IMO0001 Reserved for inherently not codable concepts without codable children: Secondary | ICD-10-CM | POA: Insufficient documentation

## 2012-09-29 DIAGNOSIS — M25519 Pain in unspecified shoulder: Secondary | ICD-10-CM | POA: Insufficient documentation

## 2012-09-29 DIAGNOSIS — M25619 Stiffness of unspecified shoulder, not elsewhere classified: Secondary | ICD-10-CM | POA: Insufficient documentation

## 2012-10-01 ENCOUNTER — Ambulatory Visit: Payer: Medicare Other | Admitting: *Deleted

## 2012-10-06 ENCOUNTER — Ambulatory Visit: Payer: Medicare Other | Admitting: *Deleted

## 2012-10-08 ENCOUNTER — Ambulatory Visit: Payer: Medicare Other | Admitting: *Deleted

## 2012-10-13 ENCOUNTER — Ambulatory Visit: Payer: Medicare Other | Admitting: *Deleted

## 2012-10-15 ENCOUNTER — Encounter: Payer: Self-pay | Admitting: Family Medicine

## 2012-10-15 ENCOUNTER — Ambulatory Visit (INDEPENDENT_AMBULATORY_CARE_PROVIDER_SITE_OTHER): Payer: Medicare Other | Admitting: Family Medicine

## 2012-10-15 VITALS — BP 130/90 | HR 72 | Temp 97.8°F | Resp 12 | Ht 66.0 in | Wt 230.0 lb

## 2012-10-15 DIAGNOSIS — Z780 Asymptomatic menopausal state: Secondary | ICD-10-CM

## 2012-10-15 LAB — LIPID PANEL
Cholesterol: 218 mg/dL — ABNORMAL HIGH (ref 0–200)
HDL: 65 mg/dL (ref >39.00)
Total CHOL/HDL Ratio: 3
Triglycerides: 116 mg/dL (ref 0.0–149.0)
VLDL: 23.2 mg/dL (ref 0.0–40.0)

## 2012-10-15 LAB — HEPATIC FUNCTION PANEL
Albumin: 4 g/dL (ref 3.5–5.2)
Alkaline Phosphatase: 47 U/L (ref 39–117)
Bilirubin, Direct: 0 mg/dL (ref 0.0–0.3)

## 2012-10-15 LAB — CBC WITH DIFFERENTIAL/PLATELET
Basophils Absolute: 0 10*3/uL (ref 0.0–0.1)
Eosinophils Absolute: 0.2 10*3/uL (ref 0.0–0.7)
Lymphocytes Relative: 36.3 % (ref 12.0–46.0)
Monocytes Relative: 7 % (ref 3.0–12.0)
Platelets: 228 10*3/uL (ref 150.0–400.0)
RDW: 13.1 % (ref 11.5–14.6)

## 2012-10-15 LAB — BASIC METABOLIC PANEL
Chloride: 102 mEq/L (ref 96–112)
Potassium: 4.8 mEq/L (ref 3.5–5.1)
Sodium: 140 mEq/L (ref 135–145)

## 2012-10-15 LAB — TSH: TSH: 2.78 u[IU]/mL (ref 0.35–5.50)

## 2012-10-15 MED ORDER — METOPROLOL TARTRATE 50 MG PO TABS: ORAL_TABLET | ORAL | Status: DC

## 2012-10-15 NOTE — Progress Notes (Signed)
Subjective:    Patient ID: Haley Kennedy, female    DOB: 12/15/1945, 67 y.o.   MRN: [id number]  HPI Patient seen for complete physical. She has history of Charcot-Marie-Tooth disease. She has history of chronic peripheral neuropathy. She's had some intermittent lower extremity edema. Multiple prior orthopedic surgeries. Also history of cholecystectomy and hysterectomy (for benign disease). Last mammogram 2010. Colonoscopy 2006. No history of confirmed tetanus. No history of shingles vaccine.  Patient only takes low-dose metoprolol and rare furosemide for lower extremity edema. No bone density scan since age 38. High risk because of sedentary. She is wheelchair-bound because of her Charcot-Marie-Tooth disease.  Past Medical History  Diagnosis Date  . Ulcer   . Hyperlipidemia   . 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 Hyperlipidemia   . Shortness of breath     with exertion;pt states its bc shes not in shape   . Asthma     as a child  . Peripheral neuropathy   . Swelling     from knee down;takes furosemide daily  . Chronic back pain   . Arthritis     low back and neck  . Gastric ulcer   . Diverticulosis   . Rotator cuff (capsule) sprain 07/09/2011  . Allergy    Past Surgical History  Procedure Laterality Date  . Tonsillectomy    . Knee surgery    . Abdominal hysterectomy      6yrs ago  . Cholecystectomy      1997  . Foot surgery      1963  . Back surgery      after back surgery had to have iron infusion  . Back surgery      2009  . Spine surgery    . Fracture surgery      reports that she quit smoking about 34 years ago. She has never used smokeless tobacco. She reports that she does not drink alcohol or use illicit drugs. family history is not on file. Allergies  Allergen Reactions  . Corticosteroids     CMT Disease  . Penicillins Other (See Comments)    Increased body temp  . Prednisone     Patient has Charcot-Marie-Tooth Disease and  cannot have any steroids  . Vincristine     CMT disease  . Ciprofloxacin Rash      Review of Systems  Constitutional: Negative for fever, activity change, appetite change, fatigue and unexpected weight change.  HENT: Negative for hearing loss, ear pain, sore throat and trouble swallowing.   Eyes: Negative for visual disturbance.  Respiratory: Negative for cough and shortness of breath.   Cardiovascular: Negative for chest pain and palpitations.  Gastrointestinal: Negative for abdominal pain, diarrhea, constipation and blood in stool.  Genitourinary: Negative for dysuria and hematuria.  Musculoskeletal: Positive for gait problem. Negative for myalgias and arthralgias.  Skin: Negative for rash.  Neurological: Positive for weakness. Negative for dizziness, syncope and headaches.  Hematological: Negative for adenopathy.  Psychiatric/Behavioral: Negative for confusion and dysphoric mood.       Objective:   Physical Exam  Constitutional: She is oriented to person, place, and time. She appears well-developed and well-nourished.  HENT:  Head: Normocephalic and atraumatic.  Eyes: EOM are normal. Pupils are equal, round, and reactive to light.  Neck: Normal range of motion. Neck supple. No thyromegaly present.  Cardiovascular: Normal rate, regular rhythm and normal heart sounds.   No murmur heard. Pulmonary/Chest: Breath sounds  normal. No respiratory distress. She has no wheezes. She has no rales.  Abdominal: Soft. Bowel sounds are normal. She exhibits no distension and no mass. There is no tenderness. There is no rebound and no guarding.  Musculoskeletal: She exhibits edema.  Patient has trace edema lower legs bilaterally. She has evidence for muscle atrophy involving upper extremities mostly hands related to her chronic Charcot-Marie-Tooth disease  Lymphadenopathy:    She has no cervical adenopathy.  Neurological: She is alert and oriented to person, place, and time. She displays  normal reflexes. No cranial nerve deficit.  Skin: No rash noted.  Psychiatric: She has a normal mood and affect. Her behavior is normal. Judgment and thought content normal.          Assessment & Plan:  Health maintenance. Tetanus booster given. Patient was scheduled for repeat mammogram. Check on insurance coverage for shingles vaccine. No indication for Pap smear with prior hysterectomy for benign disease. Obtain screening lab work. Set up DEXA scan with high risk because of inability to ambulate. Discussed adequate calcium and vitamin D intake. Colonoscopy 2006 and repeat in 2 years

## 2012-10-15 NOTE — Patient Instructions (Signed)
Check on insurance coverage for shingles vaccine We will set up DEXA scan We will call you with lab results. Set up repeat mammogram.

## 2012-10-16 ENCOUNTER — Other Ambulatory Visit: Payer: Self-pay | Admitting: *Deleted

## 2012-10-16 DIAGNOSIS — E785 Hyperlipidemia, unspecified: Secondary | ICD-10-CM

## 2012-10-16 NOTE — Progress Notes (Signed)
Quick Note:  Pt informed, future labs orered ______

## 2012-10-22 ENCOUNTER — Ambulatory Visit (INDEPENDENT_AMBULATORY_CARE_PROVIDER_SITE_OTHER)
Admission: RE | Admit: 2012-10-22 | Discharge: 2012-10-22 | Disposition: A | Discharge status: Home / Self Care | Payer: Medicare Other | Source: Ambulatory Visit | Attending: Family Medicine | Admitting: Family Medicine

## 2012-10-23 NOTE — Progress Notes (Signed)
Quick Note:  Pt informed ______ 

## 2013-01-30 ENCOUNTER — Telehealth: Payer: Self-pay | Admitting: Family Medicine

## 2013-01-30 ENCOUNTER — Ambulatory Visit: Payer: Medicare Other | Admitting: Internal Medicine

## 2013-01-30 NOTE — Telephone Encounter (Signed)
No other openings at [facility]. Do you want [place] to send her to another office?

## 2013-01-30 NOTE — Telephone Encounter (Signed)
Pt really did not prefer to go to another office. She wants to try & make it the weekend and see you Monday. I made appt for Monday. She states if her pain worsens over weekend, she'll go to [facility].

## 2013-01-30 NOTE — Telephone Encounter (Signed)
Schedule now full.  We already have one "double-book" for afternoon.  Any other providers with opening?

## 2013-01-30 NOTE — Telephone Encounter (Signed)
Yes.  We should give this option

## 2013-01-30 NOTE — Telephone Encounter (Signed)
Patient Information:  Caller Name: Beverlyann  Phone: [phone]  Patient: Haley Kennedy  Gender: Female  DOB: [birth year]  Age: 67 Years  PCP: Evelena Peat (Family Practice)  Office Follow Up:  Does the office need to follow up with this patient?: Yes  Instructions For The Office: Patient information will be shared with Dr. Caryl Never and called back by the office  RN Note:  Jeanice Lim in the office contacted and updated on patient status.  She will speak with Dr. Caryl Never regarding patient pain and status.  Patient will keep phone available and wait on call back by the office.  Symptoms  Reason For Call & Symptoms: Patient states a history of 2 back fusions in 2009.  Yesterday morning 01/29/13, she was laying in bed and went to get up she had a pain in her right middle  lower buttock.  She does not walk and is confined to wheelchair due to knees issues. She was in wheelchair yesterday and  any movment a certain way is painful.  She took left over Narcotic to be able to lay down.  Today unable to lift right leg. No numbness or tingling.  Reviewed Health History In EMR: Yes  Reviewed Medications In EMR: Yes  Reviewed Allergies In EMR: Yes  Reviewed Surgeries / Procedures: Yes  Date of Onset of Symptoms: 01/29/2013  Treatments Tried: She treated with Tylenol and Left over Hydromorphone 2mg  /2007  Treatments Tried Worked: No  Guideline(s) Used:  Back Pain  Disposition Per Guideline:   Go to ED Now (or to Office with PCP Approval)  Reason For Disposition Reached:   Weakness of a leg or foot (e.g., unable to bear weight, dragging foot)  Advice Given:  Call Back If:  Numbness or weakness occur  Bowel/bladder problems occur  You become worse.  Activity  Avoid anything that makes your pain worse. Avoid heavy lifting, twisting, and too much exercise until your back heals.  You do not need to stay in bed.  RN Overrode Recommendation:  Follow Up With Office Later  Patient information will  be shared with Dr. Caryl Never and called back by the office

## 2013-01-30 NOTE — Telephone Encounter (Signed)
Dr Caryl Never, please advise re working in today vs ED per CAN. You have a 4:30pm appt avail.

## 2013-02-02 ENCOUNTER — Ambulatory Visit (INDEPENDENT_AMBULATORY_CARE_PROVIDER_SITE_OTHER): Payer: Medicare Other | Admitting: Emergency Medicine

## 2013-02-02 ENCOUNTER — Emergency Department (HOSPITAL_COMMUNITY): Payer: Medicare Other

## 2013-02-02 ENCOUNTER — Ambulatory Visit: Payer: Medicare Other | Admitting: Family Medicine

## 2013-02-02 ENCOUNTER — Ambulatory Visit: Payer: Medicare Other

## 2013-02-02 ENCOUNTER — Emergency Department (HOSPITAL_COMMUNITY)
Admission: EM | Admit: 2013-02-02 | Discharge: 2013-02-02 | Disposition: A | Discharge status: Home / Self Care | Payer: Medicare Other | Attending: Emergency Medicine | Admitting: Emergency Medicine

## 2013-02-02 ENCOUNTER — Encounter (HOSPITAL_COMMUNITY): Payer: Self-pay | Admitting: *Deleted

## 2013-02-02 VITALS — BP 131/83 | HR 64 | Temp 97.4°F | Resp 16

## 2013-02-02 DIAGNOSIS — Z87828 Personal history of other (healed) physical injury and trauma: Secondary | ICD-10-CM | POA: Insufficient documentation

## 2013-02-02 DIAGNOSIS — K5730 Diverticulosis of large intestine without perforation or abscess without bleeding: Secondary | ICD-10-CM | POA: Insufficient documentation

## 2013-02-02 DIAGNOSIS — M545 Low back pain, unspecified: Secondary | ICD-10-CM | POA: Insufficient documentation

## 2013-02-02 DIAGNOSIS — J45909 Unspecified asthma, uncomplicated: Secondary | ICD-10-CM | POA: Insufficient documentation

## 2013-02-02 DIAGNOSIS — M129 Arthropathy, unspecified: Secondary | ICD-10-CM | POA: Insufficient documentation

## 2013-02-02 DIAGNOSIS — Z880 Allergy status to penicillin: Secondary | ICD-10-CM | POA: Insufficient documentation

## 2013-02-02 DIAGNOSIS — Z862 Personal history of diseases of the blood and blood-forming organs and certain disorders involving the immune mechanism: Secondary | ICD-10-CM | POA: Insufficient documentation

## 2013-02-02 DIAGNOSIS — Z8669 Personal history of other diseases of the nervous system and sense organs: Secondary | ICD-10-CM | POA: Insufficient documentation

## 2013-02-02 DIAGNOSIS — G600 Hereditary motor and sensory neuropathy: Secondary | ICD-10-CM

## 2013-02-02 DIAGNOSIS — Z79899 Other long term (current) drug therapy: Secondary | ICD-10-CM | POA: Insufficient documentation

## 2013-02-02 DIAGNOSIS — R259 Unspecified abnormal involuntary movements: Secondary | ICD-10-CM | POA: Insufficient documentation

## 2013-02-02 DIAGNOSIS — M549 Dorsalgia, unspecified: Secondary | ICD-10-CM

## 2013-02-02 DIAGNOSIS — Z8639 Personal history of other endocrine, nutritional and metabolic disease: Secondary | ICD-10-CM | POA: Insufficient documentation

## 2013-02-02 DIAGNOSIS — Z8711 Personal history of peptic ulcer disease: Secondary | ICD-10-CM | POA: Insufficient documentation

## 2013-02-02 DIAGNOSIS — Z87891 Personal history of nicotine dependence: Secondary | ICD-10-CM | POA: Insufficient documentation

## 2013-02-02 DIAGNOSIS — Z8719 Personal history of other diseases of the digestive system: Secondary | ICD-10-CM | POA: Insufficient documentation

## 2013-02-02 HISTORY — DX: Hereditary motor and sensory neuropathy: G60.0

## 2013-02-02 MED ORDER — CYCLOBENZAPRINE HCL 10 MG PO TABS: 10.0000 mg | ORAL_TABLET | Freq: Two times a day (BID) | ORAL | Status: DC | PRN

## 2013-02-02 MED ORDER — HYDROMORPHONE HCL PF 1 MG/ML IJ SOLN
1.0000 mg | Freq: Once | INTRAMUSCULAR | Status: AC
  Administered 2013-02-02: 1 mg via INTRAMUSCULAR
  Filled 2013-02-02: qty 1

## 2013-02-02 NOTE — ED Provider Notes (Signed)
History     CSN: [id number]  Arrival date & time 02/02/13  1353   First MD Initiated Contact with Patient 02/02/13 1459      Chief Complaint  Patient presents with  . Back Pain    (Consider location/radiation/quality/duration/timing/severity/associated sxs/prior treatment) HPI Comments: Pt is 67y/o and has hx of Charcot Alen Bleacher (she is unsure which type) for decades and has had to ambulate with braces on knees and ankles - in Wheelchair since 2009 when she had lumbar fusion surgery of L3-5 per pt report.  She states that on Wednesday she was at [employer] for hair cut - when she laid back to get her hair washed she developed pain in her lower back but this was mild.  Thursday morning she awoke and had severe pain in her R buttock and lower back when she tried to roll out of bed.  This pain is severe, intermittent and only present when she tries to lay down.  It goes away when she sits up and is absent at this time as she sits in the bed.  She has no urinary retention or incontincence, no fevers, no numbness or weakness beyond her baseline (states she can't feel as good from the knees down bilaterally at baseline).  She has no cough / sob / abd pain / dysuria / diarrhea or any other c/o.  She came from [facility] where they were unable to get imaging 2/2 the patients pain with laying supine.  Patient is a 67 y.o. female presenting with back pain. The history is provided by the patient, the spouse and medical records.  Back Pain   Past Medical History  Diagnosis Date  . Ulcer   . Hyperlipidemia   . 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 Hyperlipidemia   . Shortness of breath     with exertion;pt states its bc shes not in shape   . Asthma     as a child  . Peripheral neuropathy   . Swelling     from knee down;takes furosemide daily  . Chronic back pain   . Arthritis     low back and neck  . Gastric ulcer   . Diverticulosis   . Rotator cuff (capsule) sprain  07/09/2011  . Allergy   . CMT (Charcot-Marie-Tooth disease)     Past Surgical History  Procedure Laterality Date  . Tonsillectomy    . Knee surgery    . Abdominal hysterectomy      36yrs ago  . Cholecystectomy      1997  . Foot surgery      1963  . Back surgery      after back surgery had to have iron infusion  . Back surgery      2009  . Spine surgery    . Fracture surgery      No family history on file.  History  Substance Use Topics  . Smoking status: Former Smoker    Quit date: 08/27/1978  . Smokeless tobacco: Never Used  . Alcohol Use: No    OB History   Grav Para Term Preterm Abortions TAB SAB Ect Mult Living                  Review of Systems  Musculoskeletal: Positive for back pain.  All other systems reviewed and are negative.    Allergies  Corticosteroids; Penicillins; Prednisone; Vincristine; Adhesive; and Ciprofloxacin  Home Medications  Current Outpatient Rx  Name  Route  Sig  Dispense  Refill  . cholecalciferol (VITAMIN D) 1000 UNITS tablet   Oral   Take 1,000 Units by mouth 2 (two) times daily.         . furosemide (LASIX) 20 MG tablet   Oral   Take 20 mg by mouth daily as needed (for swelling).         . Grape Seed 60 MG CAPS   Oral   Take 60 mg by mouth daily.          . metoprolol (LOPRESSOR) 100 MG tablet   Oral   Take 50 mg by mouth daily.         . Omega-3 Fatty Acids (FISH OIL PO)   Oral   Take 1 capsule by mouth 2 (two) times daily.         Marland Kitchen OVER THE COUNTER MEDICATION   Oral   Take 1 tablet by mouth 2 (two) times daily. USANA ANTIOXIDANT SUPPLEMENT          . cyclobenzaprine (FLEXERIL) 10 MG tablet   Oral   Take 1 tablet (10 mg total) by mouth 2 (two) times daily as needed for muscle spasms.   20 tablet   0     BP 130/78  Pulse 67  Temp(Src) 97.1 F (36.2 C) (Oral)  Resp 16  SpO2 96%  Physical Exam  Nursing note and vitals reviewed. Constitutional: She appears well-developed and  well-nourished. No distress.  HENT:  Head: Normocephalic and atraumatic.  Mouth/Throat: Oropharynx is clear and moist. No oropharyngeal exudate.  Eyes: Conjunctivae and EOM are normal. Pupils are equal, round, and reactive to light. Right eye exhibits no discharge. Left eye exhibits no discharge. No scleral icterus.  Neck: Normal range of motion. Neck supple. No JVD present. No thyromegaly present.  Cardiovascular: Normal rate, regular rhythm, normal heart sounds and intact distal pulses.  Exam reveals no gallop and no friction rub.   No murmur heard. Pulmonary/Chest: Effort normal and breath sounds normal. No respiratory distress. She has no wheezes. She has no rales.  Abdominal: Soft. Bowel sounds are normal. She exhibits no distension and no mass. There is no tenderness.  Musculoskeletal: Normal range of motion. She exhibits tenderness ( ttp in the RLB and the R SI joint and the R buttock.  ). She exhibits no edema.  Lymphadenopathy:    She has no cervical adenopathy.  Neurological: She is alert. Coordination normal.  Pt able to lift both legs barely off bed and bend both knees - it is limited by pain in the RLB not by weakness and no change in sensation to the bilateral legs.  Using arms at baseline.    Skin: Skin is warm and dry. No rash noted. No erythema.  Psychiatric: She has a normal mood and affect. Her behavior is normal.    ED Course  Procedures (including critical care time)  Labs Reviewed - No data to display Dg Lumbar Spine Complete  02/02/2013   *RADIOLOGY REPORT*  Clinical Data: Low back pain radiating down right leg  LUMBAR SPINE - COMPLETE 4+ VIEW  Comparison: 10/12/2008  Findings: Five non-rib bearing lumbar vertebrae. Osseous demineralization. Prior posterior fusion L2-L4. Extensive facet degenerative changes at L4-L5 and L5-S1. Mild retrolisthesis of L1-L2. Disc prostheses at L2-L3, L3-L4, and L4-L5 appear unchanged. No acute fracture or bone destruction. SI joints  symmetric.  IMPRESSION: Degenerative and postsurgical changes of lumbar spine as above. When compared to  previous exam, slightly increased retrolisthesis and disc space narrowing at L1-L2 noted.   Original Report Authenticated By: Ulyses Southward, M.D.     1. Back pain       MDM  Pt has lower back pain which I believe is related to her musculoskeletal etiology, she has no new neurologic deficits, she is completely pain-free when she is in a sitting position, has no abnormal vital signs and is very reproducible pain when I palpate her right lower back and lower buttock. She has been reluctant to use any medications at home because of prior adverse reactions in the way of side effects such as nausea and stomach cramping when taking narcotic or opiate medications. She is amenable to using intramuscular medications her to several medications as an attempt for lumbar x-rays. I do not think she needs an MRI at this time.  Pt refuses further medications and requests d/c - declines opiate meds.  Flexeril and home, pt is in agreement, no new neuro findings, precautions given.  Meds given in ED:  Medications  HYDROmorphone (DILAUDID) injection 1 mg (1 mg Intramuscular Given 02/02/13 1540)    New Prescriptions   CYCLOBENZAPRINE (FLEXERIL) 10 MG TABLET    Take 1 tablet (10 mg total) by mouth 2 (two) times daily as needed for muscle spasms.         Vida Roller, MD 02/02/13 [id number]

## 2013-02-02 NOTE — ED Notes (Signed)
Per EMS pt from urgent care with c/o lower right side back pain since last Thursday, after waking up. Multiple back surgeries done. Bilateral splints on ankles. No new acute injury. Ambulatory when holding on to something. VSS. Pt seen at Urgent Care, sent here for further evaluation. Hx of CTM- muscular degenerative disease.

## 2013-02-02 NOTE — ED Notes (Signed)
Pt still off the floor at x-ray.

## 2013-02-02 NOTE — Progress Notes (Signed)
  Subjective:    Patient ID: Haley Kennedy, female    DOB: 03-14-1946, 68 y.o.   MRN: [id number]  HPI 67 year old female presents with back pain x 4 days; patient woke up Thursday 01/29/13 morning with severe back pain Fusions at levels L3-L4 and L4-L5 in 2009 Patient in wheelchair due to knee problems  Patient has Charcot-marie tooth disease  Primary: Dr Caryl Never  Review of Systems     Objective:   Physical Exam patient has braces on both knees and braces on both ankles. She has severe discomfort right SI area and the right paralumbar area. There is a large scar over the lower lumbar spine. Straight leg raising did not cause increasing pain.        Assessment & Plan:  Patient unable to tolerate sitting on the x-ray table. She was unable to be moved to take the appropriate films. We are unable to get her comfortable to take any x-rays. Patient transported to ER for further evaluation and x-rays . She has a history of Charcot-Marie-Tooth. She is not currently seeing any physicians primarily for this. She was given the number of Dr. Dyanne Iha to contact to see if she would agree to evaluate her for her disorder .

## 2013-03-12 ENCOUNTER — Ambulatory Visit (INDEPENDENT_AMBULATORY_CARE_PROVIDER_SITE_OTHER): Payer: Medicare Other | Admitting: Family Medicine

## 2013-03-12 ENCOUNTER — Encounter: Payer: Self-pay | Admitting: Family Medicine

## 2013-03-12 VITALS — BP 128/68 | HR 60 | Temp 97.5°F

## 2013-03-12 DIAGNOSIS — M545 Low back pain: Secondary | ICD-10-CM

## 2013-03-12 DIAGNOSIS — G600 Hereditary motor and sensory neuropathy: Secondary | ICD-10-CM

## 2013-03-12 MED ORDER — TRAMADOL HCL 50 MG PO TABS: ORAL_TABLET | ORAL | Status: DC

## 2013-03-12 MED ORDER — LIDOCAINE 5 % EX PTCH: 1.0000 | MEDICATED_PATCH | CUTANEOUS | Status: DC

## 2013-03-12 NOTE — Progress Notes (Signed)
Subjective:    Patient ID: Haley Kennedy, female    DOB: 1946-08-08, 67 y.o.   MRN: [id number]  HPI Patient is seen with several week history of severe lower lumbar back pain. She has history of Charcot-Marie-Tooth has had previous back surgeries including fusion L2-L4. She saw her neurosurgeon a few weeks ago. There been discussion of ordering MRI but she has not heard back from them since then.  She relates around early June she got out of bed one morning and had severe pain radiating toward right buttock. She rates her pain 10 out of 10 at times. Somewhat intermittent. Worse with lying supine and flexing her right hip or changing positions. Location is right lower lumbar region. Pain is sharp. Not relieved with Tylenol.  She went to the emergency room on 02/02/2013. Plain films revealed degenerative changes and evidence for previous surgeries with posterior fusion L2-L4. Also noted retro-listhesis and disc space narrowing at L1-L2. She was prescribed Flexeril but has only taken a couple doses and that did not help. She's had stomach upset with different opioids in the past and is reluctant to take those.  No radiculopathy symptoms other than pain radiating toward buttock. No stool or urine incontinence. She has some right lower extremity weakness from her Charcot-Marie-Tooth but no acute weakness. No recent fever or chills.  Past Medical History  Diagnosis Date  . Ulcer   . Hyperlipidemia   . 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 Hyperlipidemia   . Shortness of breath     with exertion;pt states its bc shes not in shape   . Asthma     as a child  . Peripheral neuropathy   . Swelling     from knee down;takes furosemide daily  . Chronic back pain   . Arthritis     low back and neck  . Gastric ulcer   . Diverticulosis   . Rotator cuff (capsule) sprain 07/09/2011  . Allergy   . CMT (Charcot-Marie-Tooth disease)    Past Surgical History  Procedure Laterality  Date  . Tonsillectomy    . Knee surgery    . Abdominal hysterectomy      56yrs ago  . Cholecystectomy      1997  . Foot surgery      1963  . Back surgery      after back surgery had to have iron infusion  . Back surgery      2009  . Spine surgery    . Fracture surgery      reports that she quit smoking about 34 years ago. She has never used smokeless tobacco. She reports that she does not drink alcohol or use illicit drugs. family history is not on file. Allergies  Allergen Reactions  . Corticosteroids     CMT Disease  . Penicillins Other (See Comments)    Increased body temp  . 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 Vincristine     CMT disease  . Adhesive (Tape) Rash    plastic  . Ciprofloxacin Rash      Review of Systems  Constitutional: Negative for fever, chills and appetite change.  Respiratory: Negative for shortness of breath.   Gastrointestinal: Negative for abdominal pain.  Genitourinary: Negative for dysuria.  Musculoskeletal: Positive for back pain.       Objective:   Physical Exam  Constitutional: She appears well-developed and  well-nourished.  Cardiovascular: Normal rate.   Pulmonary/Chest: Effort normal and breath sounds normal. No respiratory distress. She has no wheezes. She has no rales.  Musculoskeletal:  She has leg brace on regarding her Charcot-Marie-Tooth. No lower extremity pitting edema. She has some mild weakness with right hip flexion which may be chronic.  Chest some mild tenderness when palpating around the lower lumbar region. No spinal tenderness.          Assessment & Plan:  Severe intermittent back pain of over one month duration in patient with multiple previous back surgeries as above. She is strongly encouraged to follow up with her neurosurgeon. We prescribed tramadol 50 mg one to 2 every 6 hours as needed for pain #60 with 1 refill. Also prescribed Lidoderm patch apply 12  hours then leave off for 12 hours. She's been intolerant of multiple opioids in the past. She is currently trying to get followup with her neurosurgeon regarding possible MRI scan. Her symptoms are not improving but relatively stable over the past month.

## 2013-03-12 NOTE — Patient Instructions (Addendum)
Schedule follow up with Dr Dutch Quint.

## 2013-04-06 ENCOUNTER — Telehealth: Payer: Self-pay | Admitting: Family Medicine

## 2013-04-06 NOTE — Telephone Encounter (Signed)
When pt was here last time, Dr Caryl Never asked her to call Dr. Ethelene Browns office back about having MRI. She ahs called them 4 TIMES, and they have never got back to her to schedule MRI. Pt states that Dr Caryl Never wanted to know if that was the case, and that Dr. Caryl Never would call that office.  Pt states that at this point if Dr [facility] office will not call her back, she'd rather not go there anymore. She wants to know if Dr Caryl Never would send her somewhere else.

## 2013-04-06 NOTE — Telephone Encounter (Signed)
i would suggest Dr Danielle Dess with [facility].

## 2013-04-07 NOTE — Telephone Encounter (Signed)
Pt informed on the suggestion and she will call them and see if she can get appointment without a referral first

## 2013-06-22 LAB — HM MAMMOGRAPHY

## 2013-06-24 ENCOUNTER — Encounter: Payer: Self-pay | Admitting: Family Medicine

## 2013-07-01 ENCOUNTER — Other Ambulatory Visit: Payer: Self-pay | Admitting: Internal Medicine

## 2013-07-02 ENCOUNTER — Other Ambulatory Visit: Payer: Self-pay

## 2013-11-16 ENCOUNTER — Telehealth: Payer: Self-pay | Admitting: Family Medicine

## 2013-11-16 NOTE — Telephone Encounter (Signed)
Pt has jury summons and needs dr Elease Hashimoto to send (fax) excuse for pt. not to go.  Pt is in wheel chair and cannot walk. File no: [place] clerk Fax:[phone] Pt is scheduled April 20.   Pt needs a week notice to contact them. [employer], [place]

## 2013-11-17 NOTE — Telephone Encounter (Signed)
Please let patient know letter has been produced.  Print and fax after I sign.

## 2013-11-23 NOTE — Telephone Encounter (Signed)
Pt aware that letter was faxed over to [facility] clerk

## 2013-12-10 ENCOUNTER — Ambulatory Visit (INDEPENDENT_AMBULATORY_CARE_PROVIDER_SITE_OTHER): Payer: Medicare Other | Admitting: Family Medicine

## 2013-12-10 ENCOUNTER — Encounter: Payer: Self-pay | Admitting: Family Medicine

## 2013-12-10 VITALS — BP 120/78 | HR 73 | Temp 97.7°F

## 2013-12-10 DIAGNOSIS — G600 Hereditary motor and sensory neuropathy: Secondary | ICD-10-CM

## 2013-12-10 DIAGNOSIS — M545 Low back pain, unspecified: Secondary | ICD-10-CM

## 2013-12-10 MED ORDER — DICLOFENAC SODIUM 1 % TD GEL: 4.0000 g | Freq: Four times a day (QID) | TRANSDERMAL | Status: DC

## 2013-12-10 NOTE — Patient Instructions (Signed)
Lumbosacral Strain Lumbosacral strain is a strain of any of the parts that make up your lumbosacral vertebrae. Your lumbosacral vertebrae are the bones that make up the lower third of your backbone. Your lumbosacral vertebrae are held together by muscles and tough, fibrous tissue (ligaments).  CAUSES  A sudden blow to your back can cause lumbosacral strain. Also, anything that causes an excessive stretch of the muscles in the low back can cause this strain. This is typically seen when people exert themselves strenuously, fall, lift heavy objects, bend, or crouch repeatedly. RISK FACTORS  Physically demanding work.  Participation in pushing or pulling sports or sports that require sudden twist of the back (tennis, golf, baseball).  Weight lifting.  Excessive lower back curvature.  Forward-tilted pelvis.  Weak back or abdominal muscles or both.  Tight hamstrings. SIGNS AND SYMPTOMS  Lumbosacral strain may cause pain in the area of your injury or pain that moves (radiates) down your leg.  DIAGNOSIS Your health care provider can often diagnose lumbosacral strain through a physical exam. In some cases, you may need tests such as X-ray exams.  TREATMENT  Treatment for your lower back injury depends on many factors that your clinician will have to evaluate. However, most treatment will include the use of anti-inflammatory medicines. HOME CARE INSTRUCTIONS   Avoid hard physical activities (tennis, racquetball, waterskiing) if you are not in proper physical condition for it. This may aggravate or create problems.  If you have a back problem, avoid sports requiring sudden body movements. Swimming and walking are generally safer activities.  Maintain good posture.  Maintain a healthy weight.  For acute conditions, you may put ice on the injured area.  Put ice in a plastic bag.  Place a towel between your skin and the bag.  Leave the ice on for 20 minutes, 2 3 times a day.  When the  low back starts healing, stretching and strengthening exercises may be recommended. SEEK MEDICAL CARE IF:  Your back pain is getting worse.  You experience severe back pain not relieved with medicines. SEEK IMMEDIATE MEDICAL CARE IF:   You have numbness, tingling, weakness, or problems with the use of your arms or legs.  There is a change in bowel or bladder control.  You have increasing pain in any area of the body, including your belly (abdomen).  You notice shortness of breath, dizziness, or feel faint.  You feel sick to your stomach (nauseous), are throwing up (vomiting), or become sweaty.  You notice discoloration of your toes or legs, or your feet get very cold. MAKE SURE YOU:   Understand these instructions.  Will watch your condition.  Will get help right away if you are not doing well or get worse. Document Released: 05/23/2005 Document Revised: 06/03/2013 Document Reviewed: 04/01/2013 [employer] Patient Information 2014 [employer].  

## 2013-12-10 NOTE — Progress Notes (Signed)
Pre visit review using our clinic review tool, if applicable. No additional management support is needed unless otherwise documented below in the visit note. 

## 2013-12-10 NOTE — Progress Notes (Signed)
Subjective:    Patient ID: Haley Kennedy, female    DOB: 12/12/1945, 68 y.o.   MRN: [id number]  HPI Patient was initially scheduled for complete physical. However, she requested that we change this to an acute visit. She has history of Charcot-Marie-Tooth disease and some chronic back pain. She had lumbar fusion L3-L5 2009. She's been followed by neurosurgery for several years but not recently. 11 days ago when she was getting into a car she felt a pulling sensation right lower lumbar pain with persistent pain since then. She's had some radiation of pain toward her right anterior thigh. She has some chronic weakness in both lower extremities related to her Charcot-Marie-Tooth disease. She is basically wheelchair-bound with exception of minimal standing but no ambulation at baseline. Her current pain is worse with movement. 8 out 10 severity at its worst.  Heat has provided minimal relief. She's taken very low dose diazepam without much improvement. She's been intolerant of multiple medications over time including nonsteroidals, tramadol, opioids. Denies any urine or stool incontinence. No recent fevers or chills. No dysuria.  Past Medical History  Diagnosis Date  . Ulcer   . Hyperlipidemia   . 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 Hyperlipidemia   . Shortness of breath     with exertion;pt states its bc shes not in shape   . Asthma     as a child  . Peripheral neuropathy   . Swelling     from knee down;takes furosemide daily  . Chronic back pain   . Arthritis     low back and neck  . Gastric ulcer   . Diverticulosis   . Rotator cuff (capsule) sprain 07/09/2011  . Allergy   . CMT (Charcot-Marie-Tooth disease)    Past Surgical History  Procedure Laterality Date  . Tonsillectomy    . Knee surgery    . Abdominal hysterectomy      59yrs ago  . Cholecystectomy      1997  . Foot surgery      1963  . Back surgery      after back surgery had to have iron  infusion  . Back surgery      2009  . Spine surgery    . Fracture surgery      reports that she quit smoking about 35 years ago. She has never used smokeless tobacco. She reports that she does not drink alcohol or use illicit drugs. family history is not on file. Allergies  Allergen Reactions  . Corticosteroids     CMT Disease  . Penicillins Other (See Comments)    Increased body temp  . 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 Vincristine     CMT disease  . Adhesive [Tape] Rash    plastic  . Ciprofloxacin Rash      Review of Systems  Constitutional: Negative for fever and chills.  Respiratory: Negative for shortness of breath.   Cardiovascular: Negative for chest pain.  Gastrointestinal: Negative for abdominal pain.  Genitourinary: Negative for dysuria.  Musculoskeletal: Positive for back pain.  Neurological: Positive for weakness.       Objective:   Physical Exam  Constitutional: She appears well-developed and well-nourished.  Cardiovascular: Normal rate.   Pulmonary/Chest: Effort normal and breath sounds normal. No respiratory distress. She has no wheezes. She has no rales.  Musculoskeletal: She exhibits no edema.  Both  legs are in braces related to her Charcot-Marie-Tooth disease.  Neurological:  She has generalized weakness in both lower extremities at baseline. Deep tendon reflexes are difficult to elicit knee or ankle bilaterally          Assessment & Plan:  Right lumbar back pain. She has a complicated history with chronic weakness related to Charcot-Marie-Tooth disease and prior lumbar fusion surgery. Nonfocal exam. Hopefully this is all muscular. Intolerance to multiple medications as above. Continued heat for topical relief. Trial of diclofenac gel 3-4 times daily. Consider referral back to neurosurgeon in 2-3 weeks if not improving. She'll reschedule complete physical

## 2013-12-11 ENCOUNTER — Other Ambulatory Visit: Payer: Self-pay | Admitting: Family Medicine

## 2013-12-22 ENCOUNTER — Encounter: Payer: Self-pay | Admitting: Family Medicine

## 2013-12-22 ENCOUNTER — Encounter: Payer: Self-pay | Admitting: *Deleted

## 2013-12-23 ENCOUNTER — Encounter: Payer: Self-pay | Admitting: Family Medicine

## 2013-12-23 ENCOUNTER — Ambulatory Visit (INDEPENDENT_AMBULATORY_CARE_PROVIDER_SITE_OTHER): Payer: Medicare Other | Admitting: Family Medicine

## 2013-12-23 VITALS — BP 130/72 | HR 58 | Temp 97.6°F

## 2013-12-23 DIAGNOSIS — Z136 Encounter for screening for cardiovascular disorders: Secondary | ICD-10-CM

## 2013-12-23 DIAGNOSIS — G8929 Other chronic pain: Secondary | ICD-10-CM

## 2013-12-23 DIAGNOSIS — G609 Hereditary and idiopathic neuropathy, unspecified: Secondary | ICD-10-CM

## 2013-12-23 DIAGNOSIS — M549 Dorsalgia, unspecified: Secondary | ICD-10-CM

## 2013-12-23 DIAGNOSIS — R600 Localized edema: Secondary | ICD-10-CM

## 2013-12-23 DIAGNOSIS — R609 Edema, unspecified: Secondary | ICD-10-CM

## 2013-12-23 DIAGNOSIS — Z0000 Encounter for general adult medical examination without abnormal findings: Secondary | ICD-10-CM

## 2013-12-23 LAB — LIPID PANEL
Cholesterol: 223 mg/dL — ABNORMAL HIGH (ref 0–200)
HDL: 69.7 mg/dL (ref >39.00)
LDL Cholesterol: 137 mg/dL — ABNORMAL HIGH (ref 0–99)
Total CHOL/HDL Ratio: 3
Triglycerides: 83 mg/dL (ref 0.0–149.0)
VLDL: 16.6 mg/dL (ref 0.0–40.0)

## 2013-12-23 LAB — CBC WITH DIFFERENTIAL/PLATELET
Basophils Absolute: 0 10*3/uL (ref 0.0–0.1)
Basophils Relative: 0.4 % (ref 0.0–3.0)
Eosinophils Absolute: 0.3 10*3/uL (ref 0.0–0.7)
Eosinophils Relative: 4.3 % (ref 0.0–5.0)
HCT: 40.6 % (ref 36.0–46.0)
Hemoglobin: 13.7 g/dL (ref 12.0–15.0)
Lymphocytes Relative: 41.8 % (ref 12.0–46.0)
Lymphs Abs: 2.8 10*3/uL (ref 0.7–4.0)
MCHC: 33.8 g/dL (ref 30.0–36.0)
MCV: 90.8 fl (ref 78.0–100.0)
Monocytes Absolute: 0.4 10*3/uL (ref 0.1–1.0)
Monocytes Relative: 6.7 % (ref 3.0–12.0)
Neutro Abs: 3.1 10*3/uL (ref 1.4–7.7)
Neutrophils Relative %: 46.8 % (ref 43.0–77.0)
Platelets: 227 10*3/uL (ref 150.0–400.0)
RBC: 4.47 Mil/uL (ref 3.87–5.11)
RDW: 13.5 % (ref 11.5–14.6)
WBC: 6.7 10*3/uL (ref 4.5–10.5)

## 2013-12-23 LAB — BASIC METABOLIC PANEL
BUN: 14 mg/dL (ref 6–23)
CALCIUM: 9.2 mg/dL (ref 8.4–10.5)
CHLORIDE: 103 meq/L (ref 96–112)
CO2: 28 mEq/L (ref 19–32)
CREATININE: 0.3 mg/dL — AB (ref 0.4–1.2)
GFR: 292.28 mL/min (ref >60.00)
Glucose, Bld: 85 mg/dL (ref 70–99)
Potassium: 4.2 mEq/L (ref 3.5–5.1)
Sodium: 138 mEq/L (ref 135–145)

## 2013-12-23 LAB — HEPATIC FUNCTION PANEL
ALK PHOS: 44 U/L (ref 39–117)
ALT: 72 U/L — AB (ref 0–35)
AST: 39 U/L — AB (ref 0–37)
Albumin: 4 g/dL (ref 3.5–5.2)
BILIRUBIN DIRECT: 0.1 mg/dL (ref 0.0–0.3)
TOTAL PROTEIN: 7.3 g/dL (ref 6.0–8.3)
Total Bilirubin: 0.5 mg/dL (ref 0.3–1.2)

## 2013-12-23 LAB — TSH: TSH: 2.78 u[IU]/mL (ref 0.35–5.50)

## 2013-12-23 NOTE — Patient Instructions (Signed)

## 2013-12-23 NOTE — Progress Notes (Signed)
Pre visit review using our clinic review tool, if applicable. No additional management support is needed unless otherwise documented below in the visit note. 

## 2013-12-23 NOTE — Progress Notes (Signed)
Subjective:    Patient ID: Haley Kennedy, female    DOB: [birth year], 68 y.o.   MRN: [id number]  HPI Patient here for Medicare wellness exam and medical followup. Past medical history is significant for Charcot-Marie-Tooth disease, chronic back pain with multiple prior surgeries, and peripheral neuropathy. She seems couple weeks ago for some lumbar back pain and symptoms are slightly improved. She had colonoscopy 2006. Mammogram last fall is normal. No history of pneumonia vaccine or shingles vaccine she declines both today. Tetanus is up-to-date. She gets flu vaccine sporadically  She's had mild hyperlipidemia in the past. Her only regular medication currently are metoprolol at low dosage. She exercises with recumbent bike about 3-4 days per week. No recent chest pains.  Past Medical History  Diagnosis Date  . Ulcer   . Hyperlipidemia   . 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 Hyperlipidemia   . Shortness of breath     with exertion;pt states its bc shes not in shape   . Asthma     as a child  . Peripheral neuropathy   . Swelling     from knee down;takes furosemide daily  . Chronic back pain   . Arthritis     low back and neck  . Gastric ulcer   . Diverticulosis   . Rotator cuff (capsule) sprain 07/09/2011  . Allergy   . CMT (Charcot-Marie-Tooth disease)    Past Surgical History  Procedure Laterality Date  . Tonsillectomy    . Knee surgery    . Abdominal hysterectomy      67yrs ago  . Cholecystectomy      1997  . Foot surgery      1963  . Back surgery      after back surgery had to have iron infusion  . Back surgery      2009  . Spine surgery    . Fracture surgery      reports that she quit smoking about 35 years ago. She has never used smokeless tobacco. She reports that she does not drink alcohol or use illicit drugs. family history includes Cancer in her father; Pneumonia in her father. Allergies  Allergen Reactions  . Corticosteroids    CMT Disease  . Penicillins Other (See Comments)    Increased body temp  . 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 Vincristine     CMT disease  . Adhesive [Tape] Rash    plastic  . Ciprofloxacin Rash   1.  Risk factors based on Past Medical , Social, and Family history reviewed and as above. 2.  Limitations in physical activities no recent falls.  Limited ambulation.   3.  Depression/mood no depression or anxiety issues. 4.  Hearing no  Major deficits. 5.  ADLs independent in all. 6.  Cognitive function (orientation to time and place, language, writing, speech,memory)memory intact.  Judgement intact 7.  Home Safety no issues. 8.  Height, weight, and visual acuity. All stable 9.  Counseling discussed the importance of trying to lose some weight. 10. Recommendation of preventive services. We recommended shingles vaccine and Prevnar 13 and she declines both 11. Labs based on risk factors CBC, TSH, hepatic panel, lipid panel, basic metabolic panel 12. Care Plan as above     Review of Systems  Constitutional: Positive for fatigue. Negative for fever, activity change, appetite change and unexpected weight change.  HENT:  Negative for ear pain, hearing loss, sore throat and trouble swallowing.   Eyes: Negative for visual disturbance.  Respiratory: Negative for cough and wheezing.   Cardiovascular: Negative for chest pain and palpitations.  Gastrointestinal: Negative for nausea, vomiting, abdominal pain, diarrhea, constipation and blood in stool.  Endocrine: Negative for polydipsia and polyuria.  Genitourinary: Negative for dysuria and hematuria.  Musculoskeletal: Positive for back pain and myalgias. Negative for arthralgias.  Skin: Negative for rash.  Neurological: Negative for dizziness, syncope and headaches.  Hematological: Negative for adenopathy.  Psychiatric/Behavioral: Negative for confusion and dysphoric mood.       Objective:     Physical Exam  Constitutional: She is oriented to person, place, and time. She appears well-developed and well-nourished.  HENT:  Right Ear: External ear normal.  Left Ear: External ear normal.  Mouth/Throat: Oropharynx is clear and moist.  Eyes: Pupils are equal, round, and reactive to light.  Neck: Neck supple. No thyromegaly present.  Cardiovascular: Normal rate and regular rhythm.   Pulmonary/Chest: Effort normal and breath sounds normal. No respiratory distress. She has no wheezes. She has no rales.  Musculoskeletal:  Trace edema legs bilaterally which is chronic  Neurological: She is alert and oriented to person, place, and time. No cranial nerve deficit.  Patient has symmetric generalized weakness lower extremities throughout which is chronic  Skin: No rash noted.  Psychiatric: She has a normal mood and affect. Her behavior is normal.          Assessment & Plan:  #1 wellness evaluation. We've strongly recommended Prevnar 13 and shingles vaccine she declines both. She will need repeat colonoscopy Next year. We discussed at some length strategies for weight loss. Tetanus up-to-date. Continue yearly mammogram. Continue yearly flu vaccine. She's had total hysterectomy so no indication for Pap smears #2 history of chronic back pain. Would recommend followup with neurosurgeon for any recurrent back issues #3 chronic mild peripheral edema. Suspect mostly venous stasis related. Elevation and continue exercise as tolerated. Consider compression hose for any progression

## 2014-06-11 ENCOUNTER — Other Ambulatory Visit: Payer: Self-pay

## 2014-06-25 ENCOUNTER — Ambulatory Visit (INDEPENDENT_AMBULATORY_CARE_PROVIDER_SITE_OTHER): Payer: Medicare Other | Admitting: Family Medicine

## 2014-06-25 ENCOUNTER — Encounter: Payer: Self-pay | Admitting: Family Medicine

## 2014-06-25 VITALS — BP 126/70 | Temp 97.2°F

## 2014-06-25 DIAGNOSIS — J069 Acute upper respiratory infection, unspecified: Secondary | ICD-10-CM

## 2014-06-25 MED ORDER — AZITHROMYCIN 250 MG PO TABS: ORAL_TABLET | ORAL | Status: DC

## 2014-06-25 NOTE — Progress Notes (Signed)
   Subjective:    Patient ID: Haley Kennedy, female    DOB: 01-27-1946, 68 y.o.   MRN: [id number]  HPI Patient c/o uri sx's for over a week  Review of Systems  Constitutional: Negative for fever.  HENT: Negative for ear pain.   Eyes: Negative for discharge.  Respiratory: Negative for cough.   Cardiovascular: Negative for chest pain.  Gastrointestinal: Negative for abdominal distention.  Endocrine: Negative for polyuria.  Genitourinary: Negative for difficulty urinating.  Musculoskeletal: Negative for gait problem and neck pain.  Skin: Negative for color change and rash.  Neurological: Negative for speech difficulty and headaches.  Psychiatric/Behavioral: Negative for agitation.       Objective:    BP 126/70  Temp(Src) 97.2 F (36.2 C) (Oral) Physical Exam  Constitutional: She is oriented to person, place, and time. She appears well-developed and well-nourished.  HENT:  Head: Normocephalic and atraumatic.  Mouth/Throat: Oropharynx is clear and moist.  Eyes: Pupils are equal, round, and reactive to light.  Neck: Normal range of motion. Neck supple.  Cardiovascular: Normal rate and regular rhythm.   No murmur heard. Pulmonary/Chest: Effort normal and breath sounds normal.  Abdominal: Soft. Bowel sounds are normal. There is no tenderness.  Neurological: She is alert and oriented to person, place, and time.  Skin: Skin is warm and dry.  Psychiatric: She has a normal mood and affect.          Assessment & Plan:     ICD-9-CM ICD-10-CM   1. URI (upper respiratory infection) 465.9 J06.9 azithromycin (ZITHROMAX) 250 MG tablet     No Follow-up on file.  Lysbeth Penner FNP

## 2014-08-17 ENCOUNTER — Encounter: Payer: Self-pay | Admitting: Nurse Practitioner

## 2014-08-17 ENCOUNTER — Ambulatory Visit (INDEPENDENT_AMBULATORY_CARE_PROVIDER_SITE_OTHER): Payer: Medicare Other | Admitting: Nurse Practitioner

## 2014-08-17 VITALS — BP 117/67 | HR 69 | Temp 97.6°F | Ht 66.0 in

## 2014-08-17 DIAGNOSIS — J209 Acute bronchitis, unspecified: Secondary | ICD-10-CM

## 2014-08-17 MED ORDER — CEFDINIR 300 MG PO CAPS: 300.0000 mg | ORAL_CAPSULE | Freq: Two times a day (BID) | ORAL | Status: DC

## 2014-08-17 NOTE — Patient Instructions (Signed)

## 2014-08-17 NOTE — Progress Notes (Signed)
   Subjective:    Patient ID: Haley Kennedy, female    DOB: 1945/10/10, 68 y.o.   MRN: [id number]  HPI Patient in c/o cough and congestion, hoarce- phlegm is greenish.    Review of Systems  Constitutional: Positive for fever (over 100). Negative for chills.  HENT: Positive for congestion, rhinorrhea, sinus pressure and voice change. Negative for ear pain, sore throat and trouble swallowing.   Respiratory: Positive for cough.   Cardiovascular: Negative.   Gastrointestinal: Negative.   Genitourinary: Negative.   Neurological: Negative.   Psychiatric/Behavioral: Negative.   All other systems reviewed and are negative.      Objective:   Physical Exam  Constitutional: She is oriented to person, place, and time. She appears well-developed and well-nourished. No distress.  HENT:  Right Ear: Hearing, tympanic membrane, external ear and ear canal normal.  Left Ear: Hearing, tympanic membrane, external ear and ear canal normal.  Nose: Mucosal edema and rhinorrhea present. Right sinus exhibits no maxillary sinus tenderness and no frontal sinus tenderness. Left sinus exhibits no maxillary sinus tenderness and no frontal sinus tenderness.  Mouth/Throat: Uvula is midline, oropharynx is clear and moist and mucous membranes are normal.  Eyes: Pupils are equal, round, and reactive to light.  Neck: Normal range of motion. Neck supple.  Cardiovascular: Normal rate, regular rhythm and normal heart sounds.   Pulmonary/Chest: Effort normal and breath sounds normal.  Deep dry cough  Abdominal: Soft. Bowel sounds are normal.  Lymphadenopathy:    She has no cervical adenopathy.  Neurological: She is alert and oriented to person, place, and time.  Skin: Skin is warm and dry.  Psychiatric: She has a normal mood and affect. Her behavior is normal. Judgment and thought content normal.   BP 117/67 mmHg  Pulse 69  Temp(Src) 97.6 F (36.4 C) (Oral)  Ht 5\' 6"  (1.676 m)  Wt         Assessment & Plan:    1. Acute bronchitis, unspecified organism    Meds ordered this encounter  Medications  . cefdinir (OMNICEF) 300 MG capsule    Sig: Take 1 capsule (300 mg total) by mouth 2 (two) times daily.    Dispense:  20 capsule    Refill:  0    Order Specific Question:  Supervising Provider    Answer:  Chipper Herb [[id number]]   Continue hycodan as previously rx 1. Take meds as prescribed 2. Use a cool mist humidifier especially during the winter months and when heat has been humid. 3. Use saline nose sprays frequently 4. Saline irrigations of the nose can be very helpful if done frequently.  * 4X daily for 1 week*  * Use of a nettie pot can be helpful with this. Follow directions with this* 5. Drink plenty of fluids 6. Keep thermostat turn down low 7.For any cough or congestion  Use plain Mucinex- regular strength or max strength is fine   * Children- consult with Pharmacist for dosing 8. For fever or aces or pains- take tylenol or ibuprofen appropriate for age and weight.  * for fevers greater than 101 orally you may alternate ibuprofen and tylenol every  3 hours.   Mary-Margaret Hassell Done, FNP

## 2014-09-02 ENCOUNTER — Encounter: Payer: Self-pay | Admitting: Family Medicine

## 2014-09-02 ENCOUNTER — Ambulatory Visit (INDEPENDENT_AMBULATORY_CARE_PROVIDER_SITE_OTHER): Payer: Medicare Other | Admitting: Family Medicine

## 2014-09-02 VITALS — BP 112/65 | HR 70 | Temp 97.6°F

## 2014-09-02 DIAGNOSIS — J206 Acute bronchitis due to rhinovirus: Secondary | ICD-10-CM

## 2014-09-02 MED ORDER — LEVOFLOXACIN 500 MG PO TABS: 500.0000 mg | ORAL_TABLET | Freq: Every day | ORAL | Status: DC

## 2014-09-02 MED ORDER — BENZONATATE 100 MG PO CAPS: 200.0000 mg | ORAL_CAPSULE | Freq: Three times a day (TID) | ORAL | Status: DC | PRN

## 2014-09-02 MED ORDER — LEVALBUTEROL HCL 1.25 MG/0.5ML IN NEBU: 1.2500 mg | INHALATION_SOLUTION | Freq: Once | RESPIRATORY_TRACT | Status: DC

## 2014-09-02 MED ORDER — ALBUTEROL SULFATE HFA 108 (90 BASE) MCG/ACT IN AERS: 2.0000 | INHALATION_SPRAY | Freq: Four times a day (QID) | RESPIRATORY_TRACT | Status: DC | PRN

## 2014-09-02 MED ORDER — LEVALBUTEROL HCL 0.63 MG/3ML IN NEBU
0.6300 mg | INHALATION_SOLUTION | Freq: Once | RESPIRATORY_TRACT | Status: AC
Start: 2014-09-02 — End: 2014-09-02
  Administered 2014-09-02: 0.63 mg via RESPIRATORY_TRACT

## 2014-09-02 NOTE — Progress Notes (Signed)
   Subjective:    Patient ID: Haley Kennedy, female    DOB: 07/14/1946, 69 y.o.   MRN: [id number]  HPI Patient is c/o severe cough and fever.  She is having a lot of  Review of Systems     Objective:    BP 112/65 mmHg  Pulse 70  Temp(Src) 97.6 F (36.4 C) (Oral)  Wt  Physical Exam        Assessment & Plan:  No diagnosis found.   No Follow-up on file.  Lysbeth Penner FNP

## 2014-09-03 ENCOUNTER — Telehealth: Payer: Self-pay | Admitting: Family Medicine

## 2014-09-03 ENCOUNTER — Other Ambulatory Visit: Payer: Self-pay | Admitting: Family Medicine

## 2014-09-03 MED ORDER — CEFDINIR 300 MG PO CAPS: 300.0000 mg | ORAL_CAPSULE | Freq: Two times a day (BID) | ORAL | Status: DC

## 2014-09-03 NOTE — Telephone Encounter (Signed)
cefdinivir sent to pharmacy[facility]

## 2014-09-04 NOTE — Telephone Encounter (Signed)
Antibiotic sent to pharm  Pt aware

## 2014-10-14 ENCOUNTER — Ambulatory Visit (INDEPENDENT_AMBULATORY_CARE_PROVIDER_SITE_OTHER): Payer: Medicare Other | Admitting: Family Medicine

## 2014-10-14 ENCOUNTER — Encounter: Payer: Self-pay | Admitting: Family Medicine

## 2014-10-14 VITALS — BP 120/70 | HR 60 | Temp 97.6°F

## 2014-10-14 DIAGNOSIS — G250 Essential tremor: Secondary | ICD-10-CM

## 2014-10-14 DIAGNOSIS — R208 Other disturbances of skin sensation: Secondary | ICD-10-CM | POA: Diagnosis not present

## 2014-10-14 DIAGNOSIS — R209 Unspecified disturbances of skin sensation: Secondary | ICD-10-CM

## 2014-10-14 NOTE — Progress Notes (Signed)
Subjective:    Patient ID: Haley Kennedy, female    DOB: Apr 12, 1946, 69 y.o.   MRN: [id number]  HPI Patient has long-standing history of Charcot-Marie-Tooth disease. She seen today with concerns about possible circulation issues in her legs. She is nonsmoker. No history of diabetes. She is describing that her feet and lower legs frequently feel cold at night. After she gets them warm with things that heating pad does sometimes have little bit of mild burning stating that for the most part has not been severe. She is nonambulatory but does exercise with an exercise type cycle has never had any lower extremity or foot pain with exercise. She is not describing any restless leg type symptoms. She has never noted any color changes of feet.  She has history of essential tremor and was placed on low-dose metoprolol by neurologist several years ago. She's not had any progressive tremor  Past Medical History  Diagnosis Date  . Ulcer   . Hyperlipidemia   . 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 Hyperlipidemia   . Shortness of breath     with exertion;pt states its bc shes not in shape   . Asthma     as a child  . Peripheral neuropathy   . Swelling     from knee down;takes furosemide daily  . Chronic back pain   . Arthritis     low back and neck  . Gastric ulcer   . Diverticulosis   . Rotator cuff (capsule) sprain 07/09/2011  . Allergy   . CMT (Charcot-Marie-Tooth disease)    Past Surgical History  Procedure Laterality Date  . Tonsillectomy    . Knee surgery    . Abdominal hysterectomy      83yrs ago  . Cholecystectomy      1997  . Foot surgery      1963  . Back surgery      after back surgery had to have iron infusion  . Back surgery      2009  . Spine surgery    . Fracture surgery      reports that she quit smoking about 36 years ago. She has never used smokeless tobacco. She reports that she does not drink alcohol or use illicit drugs. family history  includes Cancer in her father; Pneumonia in her father. Allergies  Allergen Reactions  . Corticosteroids     CMT Disease  . Penicillins Other (See Comments)    Increased body temp  . 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 Vincristine     CMT disease  . Adhesive [Tape] Rash    plastic  . Ciprofloxacin Rash      Review of Systems  Constitutional: Negative for fever, chills and fatigue.  Eyes: Negative for visual disturbance.  Respiratory: Negative for cough, chest tightness, shortness of breath and wheezing.   Cardiovascular: Negative for chest pain, palpitations and leg swelling.  Neurological: Negative for dizziness, seizures, syncope, weakness, light-headedness and headaches.       Objective:   Physical Exam  Constitutional: She appears well-developed and well-nourished.  Cardiovascular: Normal rate and regular rhythm.   Pulmonary/Chest: Effort normal and breath sounds normal. No respiratory distress. She has no wheezes. She has no rales.  Musculoskeletal: She exhibits no edema.  Feet are warm to touch with excellent capillary refill. 1+ dorsalis pedis pulses. Posterior tibials were more difficult to  palpate she has some mild edema. No foot lesions. No tenderness          Assessment & Plan:  Patient presents with describing cold sensation in the feet. She does not have evidence clinically to suggest any major circulatory issues. We've not recommended arterial Dopplers at this time. Suspect her symptoms are more neuropathic in nature. Her pain is minimal and she is not interested in further medications at this point. Would consider trial of low-dose gabapentin or Lyrica if symptoms progress  History of essential tremor which is been stable on low-dose beta blocker.

## 2014-10-14 NOTE — Progress Notes (Signed)
Pre visit review using our clinic review tool, if applicable. No additional management support is needed unless otherwise documented below in the visit note. 

## 2015-01-17 ENCOUNTER — Other Ambulatory Visit: Payer: Self-pay | Admitting: Family Medicine

## 2015-01-25 DIAGNOSIS — L988 Other specified disorders of the skin and subcutaneous tissue: Secondary | ICD-10-CM | POA: Diagnosis not present

## 2015-01-25 DIAGNOSIS — I788 Other diseases of capillaries: Secondary | ICD-10-CM | POA: Diagnosis not present

## 2015-02-21 ENCOUNTER — Other Ambulatory Visit: Payer: Self-pay

## 2015-03-14 ENCOUNTER — Encounter: Payer: Self-pay | Admitting: Family Medicine

## 2015-03-14 ENCOUNTER — Ambulatory Visit (INDEPENDENT_AMBULATORY_CARE_PROVIDER_SITE_OTHER): Payer: Medicare Other | Admitting: Family Medicine

## 2015-03-14 VITALS — BP 130/70 | HR 68 | Temp 98.2°F

## 2015-03-14 DIAGNOSIS — E785 Hyperlipidemia, unspecified: Secondary | ICD-10-CM | POA: Diagnosis not present

## 2015-03-14 DIAGNOSIS — Z0000 Encounter for general adult medical examination without abnormal findings: Secondary | ICD-10-CM | POA: Diagnosis not present

## 2015-03-14 DIAGNOSIS — Z23 Encounter for immunization: Secondary | ICD-10-CM | POA: Diagnosis not present

## 2015-03-14 LAB — CBC WITH DIFFERENTIAL/PLATELET
Basophils Absolute: 0 10*3/uL (ref 0.0–0.1)
Basophils Relative: 0.4 % (ref 0.0–3.0)
EOS PCT: 3.3 % (ref 0.0–5.0)
Eosinophils Absolute: 0.2 10*3/uL (ref 0.0–0.7)
HCT: 38.5 % (ref 36.0–46.0)
HEMOGLOBIN: 13.1 g/dL (ref 12.0–15.0)
LYMPHS PCT: 42 % (ref 12.0–46.0)
Lymphs Abs: 3.2 10*3/uL (ref 0.7–4.0)
MCHC: 33.9 g/dL (ref 30.0–36.0)
MCV: 90.5 fl (ref 78.0–100.0)
MONO ABS: 0.6 10*3/uL (ref 0.1–1.0)
MONOS PCT: 8.5 % (ref 3.0–12.0)
NEUTROS PCT: 45.8 % (ref 43.0–77.0)
Neutro Abs: 3.5 10*3/uL (ref 1.4–7.7)
PLATELETS: 230 10*3/uL (ref 150.0–400.0)
RBC: 4.25 Mil/uL (ref 3.87–5.11)
RDW: 13.1 % (ref 11.5–15.5)
WBC: 7.6 10*3/uL (ref 4.0–10.5)

## 2015-03-14 LAB — HEPATIC FUNCTION PANEL
ALT: 76 U/L — AB (ref 0–35)
AST: 51 U/L — ABNORMAL HIGH (ref 0–37)
Albumin: 4.3 g/dL (ref 3.5–5.2)
Alkaline Phosphatase: 51 U/L (ref 39–117)
BILIRUBIN DIRECT: 0.1 mg/dL (ref 0.0–0.3)
TOTAL PROTEIN: 8 g/dL (ref 6.0–8.3)
Total Bilirubin: 0.6 mg/dL (ref 0.2–1.2)

## 2015-03-14 LAB — BASIC METABOLIC PANEL
BUN: 11 mg/dL (ref 6–23)
CO2: 27 mEq/L (ref 19–32)
Calcium: 9.7 mg/dL (ref 8.4–10.5)
Chloride: 101 mEq/L (ref 96–112)
Creatinine, Ser: 0.35 mg/dL — ABNORMAL LOW (ref 0.40–1.20)
GFR: 197.49 mL/min (ref >60.00)
Glucose, Bld: 84 mg/dL (ref 70–99)
Potassium: 3.7 mEq/L (ref 3.5–5.1)
SODIUM: 138 meq/L (ref 135–145)

## 2015-03-14 LAB — LIPID PANEL
CHOL/HDL RATIO: 3
CHOLESTEROL: 235 mg/dL — AB (ref 0–200)
HDL: 75.7 mg/dL (ref >39.00)
LDL CALC: 143 mg/dL — AB (ref 0–99)
NonHDL: 159.3
TRIGLYCERIDES: 81 mg/dL (ref 0.0–149.0)
VLDL: 16.2 mg/dL (ref 0.0–40.0)

## 2015-03-14 LAB — TSH: TSH: 3.86 u[IU]/mL (ref 0.35–4.50)

## 2015-03-14 NOTE — Progress Notes (Signed)
Subjective:    Patient ID: Haley Kennedy, female    DOB: 1946-05-12, 69 y.o.   MRN: [id number]  HPI   Patient seen for complete physical. She has history of Charcot-Marie-Tooth disease, peripheral neuropathy, essential tremor.  She is due for repeat colonoscopy this year but is not sure she wishes to go at this point. She thinks she did not have chickenpox as a child but is not totally sure. She has not gotten yearly flu vaccines in the past. Nonsmoker. She rides exercise bike 60 minutes several days per week. She has had some weight gain in recent years. She's had previous hysterectomy. She is getting every other year mammogram  Past Medical History  Diagnosis Date  . Ulcer   . Hyperlipidemia   . 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 Hyperlipidemia   . Shortness of breath     with exertion;pt states its bc shes not in shape   . Asthma     as a child  . Peripheral neuropathy   . Swelling     from knee down;takes furosemide daily  . Chronic back pain   . Arthritis     low back and neck  . Gastric ulcer   . Diverticulosis   . Rotator cuff (capsule) sprain 07/09/2011  . Allergy   . CMT (Charcot-Marie-Tooth disease)    Past Surgical History  Procedure Laterality Date  . Tonsillectomy    . Knee surgery    . Abdominal hysterectomy      73yrs ago  . Cholecystectomy      1997  . Foot surgery      1963  . Back surgery      after back surgery had to have iron infusion  . Back surgery      2009  . Spine surgery    . Fracture surgery      reports that she quit smoking about 36 years ago. She has never used smokeless tobacco. She reports that she does not drink alcohol or use illicit drugs. family history includes Cancer in her father; Pneumonia in her father. Allergies  Allergen Reactions  . Corticosteroids     CMT Disease  . Penicillins Other (See Comments)    Increased body temp  . Prednisone     Patient has Charcot-Marie-Tooth Disease and cannot  have any steroids  . Vincristine     CMT disease  . Adhesive [Tape] Rash    plastic  . Ciprofloxacin Rash      Review of Systems  Constitutional: Negative for fever, activity change, appetite change and fatigue.  HENT: Negative for ear pain, hearing loss, sore throat and trouble swallowing.   Eyes: Negative for visual disturbance.  Respiratory: Negative for cough and shortness of breath.   Cardiovascular: Negative for chest pain and palpitations.  Gastrointestinal: Negative for abdominal pain, diarrhea, constipation and blood in stool.  Genitourinary: Negative for dysuria and hematuria.  Musculoskeletal: Positive for back pain. Negative for myalgias and arthralgias.  Skin: Negative for rash.  Neurological: Negative for dizziness, syncope and headaches.  Hematological: Negative for adenopathy.  Psychiatric/Behavioral: Negative for confusion and dysphoric mood.       Objective:   Physical Exam  Constitutional: She is oriented to person, place, and time. She appears well-developed and well-nourished.  HENT:  Head: Normocephalic and atraumatic.  Eyes: EOM are normal. Pupils are equal, round, and reactive to light.  Neck: Normal range of motion. Neck  supple. No thyromegaly present.  Cardiovascular: Normal rate, regular rhythm and normal heart sounds.   No murmur heard. Pulmonary/Chest: Breath sounds normal. No respiratory distress. She has no wheezes. She has no rales.  Abdominal: Soft. Bowel sounds are normal. She exhibits no distension and no mass. There is no tenderness. There is no rebound and no guarding.  Musculoskeletal: Normal range of motion. She exhibits no edema.  Lymphadenopathy:    She has no cervical adenopathy.  Neurological: She is alert and oriented to person, place, and time. She displays normal reflexes. No cranial nerve deficit.  Skin: No rash noted.  Psychiatric: She has a normal mood and affect. Her behavior is normal. Judgment and thought content normal.           Assessment & Plan:  Complete physical. Check on coverage for shingles vaccine. We've also asked that she consider checking Varicella IgG level.  Needs repeat colonoscopy and she will consider but at this point does not wish to schedule. Recommend yearly flu vaccine. Prevnar 13 today and Pneumovax recommended in one year

## 2015-03-14 NOTE — Progress Notes (Signed)
Pre visit review using our clinic review tool, if applicable. No additional management support is needed unless otherwise documented below in the visit note. 

## 2015-03-14 NOTE — Patient Instructions (Signed)
Check on coverage for shingles vaccine Recommend yearly flu vaccine Consider check Varicella IgG level to see if you have had prior chickenpox Consider repeat colonoscopy this year Consider repeat mammogram later this year

## 2015-05-20 ENCOUNTER — Telehealth: Payer: Self-pay | Admitting: Family Medicine

## 2015-05-20 NOTE — Telephone Encounter (Signed)
Pt needs rx fax to [facility] [phone] phone # [phone] . Pt is needing 20 x 20 poly form dwr [id number] cushion. Pt has quantrum mobility power chair made by [employer].pt cushion is 69 yrs old and has split

## 2015-05-20 NOTE — Telephone Encounter (Signed)
yes

## 2015-05-20 NOTE — Telephone Encounter (Signed)
Its ok to order?

## 2015-05-24 NOTE — Telephone Encounter (Signed)
Rx sent to the pharmacy.

## 2015-06-10 DIAGNOSIS — M25561 Pain in right knee: Secondary | ICD-10-CM | POA: Diagnosis not present

## 2015-06-10 DIAGNOSIS — M25562 Pain in left knee: Secondary | ICD-10-CM | POA: Diagnosis not present

## 2015-06-10 DIAGNOSIS — G600 Hereditary motor and sensory neuropathy: Secondary | ICD-10-CM | POA: Diagnosis not present

## 2015-07-13 DIAGNOSIS — M14661 Charcot's joint, right knee: Secondary | ICD-10-CM | POA: Diagnosis not present

## 2015-07-13 DIAGNOSIS — M14662 Charcot's joint, left knee: Secondary | ICD-10-CM | POA: Diagnosis not present

## 2015-07-18 DIAGNOSIS — G602 Neuropathy in association with hereditary ataxia: Secondary | ICD-10-CM | POA: Diagnosis not present

## 2015-07-18 DIAGNOSIS — M129 Arthropathy, unspecified: Secondary | ICD-10-CM | POA: Diagnosis not present

## 2015-07-18 DIAGNOSIS — R269 Unspecified abnormalities of gait and mobility: Secondary | ICD-10-CM | POA: Diagnosis not present

## 2015-07-18 DIAGNOSIS — M6281 Muscle weakness (generalized): Secondary | ICD-10-CM | POA: Diagnosis not present

## 2015-09-16 DIAGNOSIS — R269 Unspecified abnormalities of gait and mobility: Secondary | ICD-10-CM | POA: Diagnosis not present

## 2015-09-16 DIAGNOSIS — M129 Arthropathy, unspecified: Secondary | ICD-10-CM | POA: Diagnosis not present

## 2015-09-16 DIAGNOSIS — M6281 Muscle weakness (generalized): Secondary | ICD-10-CM | POA: Diagnosis not present

## 2015-09-19 DIAGNOSIS — L720 Epidermal cyst: Secondary | ICD-10-CM | POA: Diagnosis not present

## 2015-09-19 DIAGNOSIS — L821 Other seborrheic keratosis: Secondary | ICD-10-CM | POA: Diagnosis not present

## 2015-10-03 ENCOUNTER — Ambulatory Visit (INDEPENDENT_AMBULATORY_CARE_PROVIDER_SITE_OTHER): Payer: Medicare Other | Admitting: Family Medicine

## 2015-10-03 VITALS — BP 120/90 | HR 76 | Temp 97.8°F | Ht 66.0 in

## 2015-10-03 DIAGNOSIS — Z1211 Encounter for screening for malignant neoplasm of colon: Secondary | ICD-10-CM | POA: Diagnosis not present

## 2015-10-03 DIAGNOSIS — Z711 Person with feared health complaint in whom no diagnosis is made: Secondary | ICD-10-CM | POA: Diagnosis not present

## 2015-10-03 NOTE — Progress Notes (Signed)
Subjective:    Patient ID: Haley Kennedy, female    DOB: Jan 16, 1946, 70 y.o.   MRN: [id number]  HPI Patient is seen to discuss concerned regarding right axillary region swelling back around Christmas time. She states that she was shaving and thought she noticed some swelling in that region. She subsequently went to dermatologist and no adenopathy or other concerns were noted. She has not had any pain. She is not noted any obvious adenopathy. She is being scheduled for mammogram and was advised to follow-up here to determine whether she needs a screening vs diagnostic mammogram. She has not had any breast masses. No history of any hidradenitis issues.  Also overdue for repeat colon cancer screening. She is not sure she wishes to pursue colonoscopy. She specifically has interest in considering DNA-based stool test.  Past Medical History  Diagnosis Date  . Ulcer   . Hyperlipidemia   . 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 Hyperlipidemia   . Shortness of breath     with exertion;pt states its bc shes not in shape   . Asthma     as a child  . Peripheral neuropathy   . Swelling     from knee down;takes furosemide daily  . Chronic back pain   . Arthritis     low back and neck  . Gastric ulcer   . Diverticulosis   . Rotator cuff (capsule) sprain 07/09/2011  . Allergy   . CMT (Charcot-Marie-Tooth disease)    Past Surgical History  Procedure Laterality Date  . Tonsillectomy    . Knee surgery    . Abdominal hysterectomy      3yrs ago  . Cholecystectomy      1997  . Foot surgery      1963  . Back surgery      after back surgery had to have iron infusion  . Back surgery      2009  . Spine surgery    . Fracture surgery      reports that she quit smoking about 37 years ago. She has never used smokeless tobacco. She reports that she does not drink alcohol or use illicit drugs. family history includes Cancer in her father; Pneumonia in her father. Allergies    Allergen Reactions  . Corticosteroids     CMT Disease  . Penicillins Other (See Comments)    Increased body temp  . 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 Vincristine     CMT disease  . Adhesive [Tape] Rash    plastic  . Ciprofloxacin Rash      Review of Systems  Constitutional: Negative for fever and chills.  Respiratory: Negative for cough and shortness of breath.   Cardiovascular: Negative for chest pain.  Gastrointestinal: Negative for abdominal pain and blood in stool.  Endocrine: Negative for polydipsia and polyuria.  Genitourinary: Negative for dysuria.  Skin: Negative for rash.  Hematological: Negative for adenopathy.       Objective:   Physical Exam  Constitutional: She appears well-developed and well-nourished.  Cardiovascular: Normal rate and regular rhythm.   Pulmonary/Chest: Effort normal and breath sounds normal. No respiratory distress. She has no wheezes. She has no rales.  Right and left axillary regions are examined. She has no evidence for any adenopathy. No overlying skin changes such as erythema or warmth. No masses noted. No supraclavicular adenopathy  Skin: No  rash noted.          Assessment & Plan:  #1 colon cancer screening. She was given brochure and information on DNA-based stool testing which she will consider. She will check first with insurance to see if this is covered. #2 concern for right axillary swelling couple months ago but no evidence whatsoever for any swelling or mass at this time. No palpable adenopathy. She will proceed with regular screening mammogram

## 2015-10-03 NOTE — Progress Notes (Signed)
Pre visit review using our clinic review tool, if applicable. No additional management support is needed unless otherwise documented below in the visit note. 

## 2015-10-11 DIAGNOSIS — L720 Epidermal cyst: Secondary | ICD-10-CM | POA: Diagnosis not present

## 2015-10-18 DIAGNOSIS — Z1231 Encounter for screening mammogram for malignant neoplasm of breast: Secondary | ICD-10-CM | POA: Diagnosis not present

## 2015-10-18 LAB — HM MAMMOGRAPHY

## 2015-10-19 ENCOUNTER — Encounter: Payer: Self-pay | Admitting: Family Medicine

## 2015-10-24 ENCOUNTER — Telehealth: Payer: Self-pay

## 2015-10-24 DIAGNOSIS — R928 Other abnormal and inconclusive findings on diagnostic imaging of breast: Secondary | ICD-10-CM

## 2015-10-24 DIAGNOSIS — N63 Unspecified lump in breast: Secondary | ICD-10-CM | POA: Diagnosis not present

## 2015-10-24 NOTE — Telephone Encounter (Signed)
Orders only

## 2015-12-01 DIAGNOSIS — H40033 Anatomical narrow angle, bilateral: Secondary | ICD-10-CM | POA: Diagnosis not present

## 2015-12-01 DIAGNOSIS — H2513 Age-related nuclear cataract, bilateral: Secondary | ICD-10-CM | POA: Diagnosis not present

## 2015-12-26 DIAGNOSIS — H2513 Age-related nuclear cataract, bilateral: Secondary | ICD-10-CM | POA: Diagnosis not present

## 2015-12-26 DIAGNOSIS — H04552 Acquired stenosis of left nasolacrimal duct: Secondary | ICD-10-CM | POA: Diagnosis not present

## 2016-01-09 DIAGNOSIS — H2513 Age-related nuclear cataract, bilateral: Secondary | ICD-10-CM | POA: Diagnosis not present

## 2016-01-09 DIAGNOSIS — H18822 Corneal disorder due to contact lens, left eye: Secondary | ICD-10-CM | POA: Diagnosis not present

## 2016-01-24 DIAGNOSIS — L03031 Cellulitis of right toe: Secondary | ICD-10-CM | POA: Diagnosis not present

## 2016-01-24 DIAGNOSIS — M79674 Pain in right toe(s): Secondary | ICD-10-CM | POA: Diagnosis not present

## 2016-01-30 ENCOUNTER — Telehealth: Payer: Self-pay | Admitting: Family Medicine

## 2016-01-30 DIAGNOSIS — E669 Obesity, unspecified: Secondary | ICD-10-CM

## 2016-01-30 DIAGNOSIS — Z713 Dietary counseling and surveillance: Secondary | ICD-10-CM

## 2016-01-30 NOTE — Telephone Encounter (Signed)
Pt would like referral to [facility]. Pt would like to get counseling to help manage her weight.  Pt has gained >40 lbs since being in the wheelchair and   cannot get a hold on her weight.  Pt states if there is another place you prefer, that is ok too.

## 2016-01-31 NOTE — Telephone Encounter (Signed)
Okay to refer? 

## 2016-01-31 NOTE — Telephone Encounter (Signed)
Ok to refer.

## 2016-01-31 NOTE — Telephone Encounter (Signed)
Order entered for appointment.

## 2016-02-09 DIAGNOSIS — L03031 Cellulitis of right toe: Secondary | ICD-10-CM | POA: Diagnosis not present

## 2016-02-16 DIAGNOSIS — H2512 Age-related nuclear cataract, left eye: Secondary | ICD-10-CM | POA: Diagnosis not present

## 2016-02-24 ENCOUNTER — Encounter: Payer: Medicare Other | Attending: Family Medicine | Admitting: Nutrition

## 2016-02-24 ENCOUNTER — Encounter: Payer: Self-pay | Admitting: Nutrition

## 2016-02-24 DIAGNOSIS — Z713 Dietary counseling and surveillance: Secondary | ICD-10-CM | POA: Insufficient documentation

## 2016-02-24 DIAGNOSIS — E669 Obesity, unspecified: Secondary | ICD-10-CM

## 2016-02-24 DIAGNOSIS — G600 Hereditary motor and sensory neuropathy: Secondary | ICD-10-CM

## 2016-02-24 NOTE — Patient Instructions (Signed)
Goals 1. Follow My Plate 2. Drink 4 bottles of water per day. 3. Do not eat after 7 pm 4. Make sure to have protein with meals. 5. Exercise 30-45 minutes per day.

## 2016-02-24 NOTE — Progress Notes (Signed)
  Medical Nutrition Therapy:  Appt start time: 1330 end time:  1430.   Assessment:  Primary concerns today: Overweight. Unable to stand to obtain weight.  In a wheelchair due to back problems for 9 years or so and also has  [place]. Non ambulatory on her own.Marland Kitchen Her husband cares for her. Able to use a new steper that she uses with assistance and gets 3,000 steps in on that. Eats 2 meals per day. Skips breakfast. She admits to emotionally eating. She admits to eating after dinner til 1 am. She has stopped caffeine or soda. Has been eating vegetarian.  Diet is excessive in calories causing weight gain.  Preferred Learning Style:   No preference indicated   Learning Readiness:   Ready  Change in progress   MEDICATIONS: See list    DIETARY INTAKE:  24-hr recall:  B ( AM): skips/ 1 slice toast with polymer jello or egg sandwich, coffee Snk ( AM): none L ( PM): pork n beans 5 oz and 5 crackers, Snk ( PM): D ( PM): Turnip greens, broccoli, and toss salad and FF dressing,  Cornbread muffin, sweet potatoes Snk ( PM): angle food cake with fruit,  Beverages:water, coffee,   Usual physical activity:   Estimated energy needs: 1200 calories 135 g carbohydrates 90 g protein 44 g fat  Progress Towards Goal(s):  In progress.   Nutritional Diagnosis:   NI-1.5 Excessive energy intake As related to being in wheelchair.  As evidenced by being overweight and eating habits..    Intervention:  Healthy eating, My Plate Method portion sizes, meal planning, benefits of exercise and eating more fresh fruits and vegetables and whole grains on a plant based diet, importance of drinking more water and avoiding eating after dinner meal at 7 pm. Low Sodium High Fiber Diet   Goals: 1. Follow My Plate-plant based diet 2. Do not skip meals. 3. Eat three meals per day as discussed. 4. Cut out food after 7 pm unless veggies or protein. 5. Drink 4-5 bottles of water per day for hydration  needs. 6. Increase high fiber foods.  Teaching Method Utilized:  Visual Auditory Hands on  Handouts given during visit include:  The Plate Method   Meal Plan Card    Barriers to learning/adherence to lifestyle change: Physically limited to wheelchair mainly.  Demonstrated degree of understanding via:  Teach Back   Monitoring/Evaluation:  Dietary intake, exercise, meal planning, and body weight in 1 month(s).

## 2016-03-05 ENCOUNTER — Telehealth: Payer: Self-pay | Admitting: Nutrition

## 2016-03-05 NOTE — Telephone Encounter (Signed)
PT. Called requesting information on protein choices she can eat when she is hungry. Reviewed options with her. She verbalized understanding.

## 2016-03-16 ENCOUNTER — Other Ambulatory Visit (INDEPENDENT_AMBULATORY_CARE_PROVIDER_SITE_OTHER): Payer: Medicare Other

## 2016-03-16 DIAGNOSIS — Z0000 Encounter for general adult medical examination without abnormal findings: Secondary | ICD-10-CM

## 2016-03-16 LAB — LIPID PANEL
Cholesterol: 216 mg/dL — ABNORMAL HIGH (ref 0–200)
HDL: 63 mg/dL (ref >39.00)
LDL Cholesterol: 137 mg/dL — ABNORMAL HIGH (ref 0–99)
NONHDL: 153.34
TRIGLYCERIDES: 82 mg/dL (ref 0.0–149.0)
Total CHOL/HDL Ratio: 3
VLDL: 16.4 mg/dL (ref 0.0–40.0)

## 2016-03-16 LAB — BASIC METABOLIC PANEL
BUN: 7 mg/dL (ref 6–23)
CHLORIDE: 103 meq/L (ref 96–112)
CO2: 28 meq/L (ref 19–32)
CREATININE: 0.34 mg/dL — AB (ref 0.40–1.20)
Calcium: 9.4 mg/dL (ref 8.4–10.5)
GFR: 203.59 mL/min (ref >60.00)
Glucose, Bld: 85 mg/dL (ref 70–99)
POTASSIUM: 3.9 meq/L (ref 3.5–5.1)
SODIUM: 138 meq/L (ref 135–145)

## 2016-03-16 LAB — CBC WITH DIFFERENTIAL/PLATELET
BASOS PCT: 0.4 % (ref 0.0–3.0)
Basophils Absolute: 0 10*3/uL (ref 0.0–0.1)
EOS PCT: 2.1 % (ref 0.0–5.0)
Eosinophils Absolute: 0.1 10*3/uL (ref 0.0–0.7)
HEMATOCRIT: 37.4 % (ref 36.0–46.0)
Hemoglobin: 12.6 g/dL (ref 12.0–15.0)
LYMPHS PCT: 44.3 % (ref 12.0–46.0)
Lymphs Abs: 2.9 10*3/uL (ref 0.7–4.0)
MCHC: 33.7 g/dL (ref 30.0–36.0)
MCV: 90.7 fl (ref 78.0–100.0)
MONOS PCT: 7.6 % (ref 3.0–12.0)
Monocytes Absolute: 0.5 10*3/uL (ref 0.1–1.0)
NEUTROS ABS: 3 10*3/uL (ref 1.4–7.7)
Neutrophils Relative %: 45.6 % (ref 43.0–77.0)
PLATELETS: 230 10*3/uL (ref 150.0–400.0)
RBC: 4.13 Mil/uL (ref 3.87–5.11)
RDW: 12.5 % (ref 11.5–15.5)
WBC: 6.5 10*3/uL (ref 4.0–10.5)

## 2016-03-16 LAB — TSH: TSH: 4.32 u[IU]/mL (ref 0.35–4.50)

## 2016-03-16 LAB — HEPATIC FUNCTION PANEL
ALBUMIN: 3.9 g/dL (ref 3.5–5.2)
ALT: 41 U/L — AB (ref 0–35)
AST: 30 U/L (ref 0–37)
Alkaline Phosphatase: 45 U/L (ref 39–117)
Bilirubin, Direct: 0.1 mg/dL (ref 0.0–0.3)
TOTAL PROTEIN: 6.9 g/dL (ref 6.0–8.3)
Total Bilirubin: 0.6 mg/dL (ref 0.2–1.2)

## 2016-03-19 ENCOUNTER — Ambulatory Visit: Payer: Medicare Other | Admitting: Nutrition

## 2016-03-21 ENCOUNTER — Encounter: Payer: Self-pay | Admitting: Family Medicine

## 2016-03-21 ENCOUNTER — Ambulatory Visit (INDEPENDENT_AMBULATORY_CARE_PROVIDER_SITE_OTHER): Payer: Medicare Other | Admitting: Family Medicine

## 2016-03-21 VITALS — BP 118/80 | HR 70 | Temp 98.1°F

## 2016-03-21 DIAGNOSIS — Z0000 Encounter for general adult medical examination without abnormal findings: Secondary | ICD-10-CM | POA: Diagnosis not present

## 2016-03-21 DIAGNOSIS — Z23 Encounter for immunization: Secondary | ICD-10-CM

## 2016-03-21 NOTE — Progress Notes (Signed)
Pre visit review using our clinic review tool, if applicable. No additional management support is needed unless otherwise documented below in the visit note. 

## 2016-03-21 NOTE — Progress Notes (Signed)
Subjective:     Patient ID: Haley Kennedy, female   DOB: [birth year], 70 y.o.   MRN: [id number]  HPI Patient seen for physical exam. Her medical problems include history of obesity, essential tremor, Charcot-Marie-Tooth disease, mild liver transaminase elevations probably secondary to fatty liver changes She smoked only briefly in her 68s. She's not had colonoscopy for 10 years but refuses at this time. She is concerned more about the prep than anything because of difficulty getting to the commode. She is considering DNA-based stool testing Recent mammogram normal. Needs Pneumovax. Otherwise, immunizations up-to-date  Has met with nutritionist during the past year. Has made some positive dietary changes. She has eliminated colas.. Increased water intake. Restriction of carbohydrates. Restriction of caffeine and overall feels better  Past Medical History:  Diagnosis Date  . Allergy   . Arthritis    low back and neck  . Asthma    as a child  . Chronic back pain   . CMT (Charcot-Marie-Tooth disease)   . Diverticulosis   . Gastric ulcer   . Hyperlipidemia   . Hyperlipidemia   . Peripheral neuropathy ([place])   . Rotator cuff (capsule) sprain 07/09/2011  . Shortness of breath    with exertion;pt states its bc shes not in shape   . Swelling    from knee down;takes furosemide daily  . 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 Ulcer    Past Surgical History:  Procedure Laterality Date  . ABDOMINAL HYSTERECTOMY     22yr ago  . BACK SURGERY     after back surgery had to have iron infusion  . BACK SURGERY     2009  . [place]ST[place]OMY     [place]    . KNEE SURGERY    . SPINE SURGERY    . TONSILLECTOMY      reports that she quit smoking about 37 years ago. She has never used smokeless tobacco. She reports that she does not drink alcohol or use drugs. family history includes Cancer in her father; Pneumonia in her  father. Allergies  Allergen Reactions  . Corticosteroids     CMT Disease  . Penicillins Other (See Comments)    Increased body temp  . 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 Vincristine     CMT disease  . Adhesive [Tape] Rash    plastic  . Ciprofloxacin Rash     Review of Systems  Constitutional: Negative for activity change, appetite change, fatigue, fever and unexpected weight change.  HENT: Negative for hearing loss, sore throat and trouble swallowing.   Respiratory: Negative for cough, shortness of breath and wheezing.   Cardiovascular: Negative for chest pain, palpitations and leg swelling.  Gastrointestinal: Negative for abdominal distention, abdominal pain, blood in stool, nausea and vomiting.  Genitourinary: Negative for dysuria and hematuria.  Musculoskeletal: Negative for gait problem and myalgias.  Skin: Negative for rash.  Neurological: Negative for dizziness, syncope, weakness and headaches.  Hematological: Negative for adenopathy.  Psychiatric/Behavioral: Negative for confusion and dysphoric mood.       Objective:   Physical Exam  Constitutional: She is oriented to person, place, and time. She appears well-developed and well-nourished.  HENT:  Right Ear: External ear normal.  Left Ear: External ear normal.  Mouth/Throat: Oropharynx is clear and moist.  Neck: Neck supple. No thyromegaly  present.  Cardiovascular: Normal rate and regular rhythm.  Exam reveals no gallop.   No murmur heard. Pulmonary/Chest: Effort normal and breath sounds normal. No respiratory distress. She has no wheezes. She has no rales.  Abdominal: Soft. There is no tenderness.  Musculoskeletal: She exhibits no edema.  She has no muscle atrophy involving especially interosseous muscles of the hands related to her Charcot-Marie-Tooth disease  Lymphadenopathy:    She has no cervical adenopathy.  Neurological: She is alert and oriented to  person, place, and time. No cranial nerve deficit.  Skin: No rash noted.  Psychiatric: She has a normal mood and affect. Her behavior is normal.       Assessment:     Physical exam. Needs Pneumovax. Needs repeat colon cancer screening.    Plan:     -She declines shingles vaccine. She thinks she did not have history of chickenpox. Offered Varicella IgG testing and she declines -Consider Cologuard and information given -Pneumovax given -Recommend yearly flu vaccine -Continue weight loss efforts -Continue with yearly mammogram -prior hysterectomy so no indication for pap.  Eulas Post MD [facility] at [facility]

## 2016-03-21 NOTE — Patient Instructions (Signed)
Recommend yearly flu vaccine Consider Cologuard and let me know if interested and we can order. Consider at least every other year mammogram

## 2016-03-26 ENCOUNTER — Ambulatory Visit: Payer: Medicare Other | Admitting: Nutrition

## 2016-04-27 DIAGNOSIS — M228X1 Other disorders of patella, right knee: Secondary | ICD-10-CM | POA: Diagnosis not present

## 2016-04-27 DIAGNOSIS — M6281 Muscle weakness (generalized): Secondary | ICD-10-CM | POA: Diagnosis not present

## 2016-04-27 DIAGNOSIS — G2589 Other specified extrapyramidal and movement disorders: Secondary | ICD-10-CM | POA: Diagnosis not present

## 2016-05-29 DIAGNOSIS — M545 Low back pain: Secondary | ICD-10-CM | POA: Diagnosis not present

## 2016-06-13 DIAGNOSIS — M47817 Spondylosis without myelopathy or radiculopathy, lumbosacral region: Secondary | ICD-10-CM | POA: Diagnosis not present

## 2016-06-13 DIAGNOSIS — Z9889 Other specified postprocedural states: Secondary | ICD-10-CM | POA: Diagnosis not present

## 2016-06-13 DIAGNOSIS — M47816 Spondylosis without myelopathy or radiculopathy, lumbar region: Secondary | ICD-10-CM | POA: Diagnosis not present

## 2016-06-13 DIAGNOSIS — M545 Low back pain: Secondary | ICD-10-CM | POA: Diagnosis not present

## 2016-06-13 DIAGNOSIS — M4316 Spondylolisthesis, lumbar region: Secondary | ICD-10-CM | POA: Diagnosis not present

## 2016-06-13 DIAGNOSIS — Z981 Arthrodesis status: Secondary | ICD-10-CM | POA: Diagnosis not present

## 2016-06-26 DIAGNOSIS — M545 Low back pain: Secondary | ICD-10-CM | POA: Diagnosis not present

## 2016-07-11 ENCOUNTER — Telehealth: Payer: Self-pay | Admitting: Family Medicine

## 2016-07-11 DIAGNOSIS — G600 Hereditary motor and sensory neuropathy: Secondary | ICD-10-CM

## 2016-07-11 NOTE — Telephone Encounter (Signed)
Pt was last seen on 03-21-2016.  Please review.

## 2016-07-11 NOTE — Telephone Encounter (Signed)
Pt would like a referral to [facility]  Pt is wanting to get hand controls on a [name] so that she can drive. They would be the ones to address this, but pt needs referral from Dr Elease Hashimoto first. [id number] Option 1

## 2016-07-11 NOTE — Telephone Encounter (Signed)
OK to refer.

## 2016-07-12 NOTE — Telephone Encounter (Signed)
Order entered for referral. Office will contact patient to schedule.

## 2016-07-23 NOTE — Telephone Encounter (Addendum)
The referral need to be fax to [facility] not office in [place] phone #  [phone] fax # [phone]

## 2016-08-06 NOTE — Telephone Encounter (Signed)
Spoke to [place] at [place] about referral follow-up. Irine Seal made me aware the attached diagnosis was appropriate for referral but was missing reason for the referral.   Made Kenisha aware of communication on 07/11/16 at 2:15pm of patient requesting hand controls.   Kenisha refaxed referral forms, forms were completed on [place], and were faxed back.  Daughter (not on [place]) was communicated back (initally contacted via voicemail about concerns of mother's referral) that the concerns she called about have been addressed but I was not able to address any of mother's other information. Daughter communicated understanding.

## 2016-10-01 ENCOUNTER — Telehealth: Payer: Self-pay | Admitting: Family Medicine

## 2016-10-01 DIAGNOSIS — R6889 Other general symptoms and signs: Secondary | ICD-10-CM | POA: Insufficient documentation

## 2016-10-01 NOTE — Telephone Encounter (Signed)
Pt would like to have a TSH test done due to her staying so cold and she would like to have this done but would like to wait after the flu season.  She would like to know what Dr. Elease Hashimoto suggest.

## 2016-10-01 NOTE — Telephone Encounter (Signed)
Pt had a CPE 02/2016 --tsh was normal then. Please advise if okay to repeat due to her recent SX.

## 2016-10-01 NOTE — Telephone Encounter (Signed)
Pt is aware via voicemail that lab is entered and she can call to schedule appt.

## 2016-10-01 NOTE — Telephone Encounter (Signed)
May check TSH.

## 2016-10-05 NOTE — Telephone Encounter (Signed)
Pt scheduled  

## 2016-10-08 ENCOUNTER — Other Ambulatory Visit: Payer: Medicare Other

## 2016-10-08 ENCOUNTER — Encounter: Payer: Self-pay | Admitting: Family Medicine

## 2016-10-08 ENCOUNTER — Other Ambulatory Visit (INDEPENDENT_AMBULATORY_CARE_PROVIDER_SITE_OTHER): Payer: Medicare Other

## 2016-10-08 DIAGNOSIS — R6889 Other general symptoms and signs: Secondary | ICD-10-CM | POA: Diagnosis not present

## 2016-10-08 LAB — TSH: TSH: 3.6 u[IU]/mL (ref 0.35–4.50)

## 2016-10-17 DIAGNOSIS — M129 Arthropathy, unspecified: Secondary | ICD-10-CM | POA: Diagnosis not present

## 2016-10-17 DIAGNOSIS — R269 Unspecified abnormalities of gait and mobility: Secondary | ICD-10-CM | POA: Diagnosis not present

## 2016-10-17 DIAGNOSIS — M6281 Muscle weakness (generalized): Secondary | ICD-10-CM | POA: Diagnosis not present

## 2017-02-12 ENCOUNTER — Ambulatory Visit (INDEPENDENT_AMBULATORY_CARE_PROVIDER_SITE_OTHER): Payer: Medicare Other | Admitting: Family Medicine

## 2017-02-12 ENCOUNTER — Encounter: Payer: Self-pay | Admitting: Family Medicine

## 2017-02-12 VITALS — BP 118/80 | HR 66 | Temp 97.7°F

## 2017-02-12 DIAGNOSIS — G8929 Other chronic pain: Secondary | ICD-10-CM | POA: Diagnosis not present

## 2017-02-12 DIAGNOSIS — R222 Localized swelling, mass and lump, trunk: Secondary | ICD-10-CM | POA: Diagnosis not present

## 2017-02-12 DIAGNOSIS — M25511 Pain in right shoulder: Secondary | ICD-10-CM

## 2017-02-12 DIAGNOSIS — R2230 Localized swelling, mass and lump, unspecified upper limb: Secondary | ICD-10-CM

## 2017-02-12 NOTE — Progress Notes (Signed)
Subjective:     Patient ID: Haley Kennedy, female   DOB: August 21, 1946, 71 y.o.   MRN: [id number]  HPI Patient is seen with concern for some right axillary "fullness "compared to the left side. She is right-hand dominant. She has to use her right upper extremity considerably more to pull herself up when changing positions. She is basically wheelchair bound. She has history of Charcot-Marie-Tooth disease. She had mammogram Feb 2017 and had concomitant ultrasound. She states she's had lots of discomfort with mammograms in the past was not sure she is willing to go through any more screenings. She has not noted any distinct masses. No nipple discharge. No nipple inversion.  Patient is requesting OT consult at home. She is able to get down on the commode but has difficulty pulling herself up with her current arrangement. She's had previous right rotator cuff shoulder repair in the past  Past Medical History:  Diagnosis Date  . Allergy   . Arthritis    low back and neck  . Asthma    as a child  . Chronic back pain   . CMT (Charcot-Marie-Tooth disease)   . Diverticulosis   . Gastric ulcer   . Hyperlipidemia   . Hyperlipidemia   . Peripheral neuropathy   . Rotator cuff (capsule) sprain 07/09/2011  . Shortness of breath    with exertion;pt states its bc shes not in shape   . Swelling    from knee down;takes furosemide daily  . 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 Ulcer    Past Surgical History:  Procedure Laterality Date  . ABDOMINAL HYSTERECTOMY     29yrs ago  . BACK SURGERY     after back surgery had to have iron infusion  . BACK SURGERY     2009  . CHOLECYSTECTOMY     1997  . [place]  . FRACTURE SURGERY    . KNEE SURGERY    . SPINE SURGERY    . TONSILLECTOMY      reports that she quit smoking about 38 years ago. She has never used smokeless tobacco. She reports that she does not drink alcohol or use drugs. family history includes Cancer in  her father; Pneumonia in her father. Allergies  Allergen Reactions  . Corticosteroids     CMT Disease  . Penicillins Other (See Comments)    Increased body temp  . 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 Vincristine     CMT disease  . Adhesive [Tape] Rash    plastic  . Ciprofloxacin Rash     Review of Systems  Constitutional: Negative for appetite change, chills, fever and unexpected weight change.  Respiratory: Negative for shortness of breath.   Cardiovascular: Negative for chest pain.  Hematological: Negative for adenopathy.       Objective:   Physical Exam  Constitutional: She appears well-developed and well-nourished.  Cardiovascular: Normal rate and regular rhythm.   Pulmonary/Chest: Effort normal and breath sounds normal. No respiratory distress. She has no wheezes. She has no rales.  Right breast is no skin dimpling visible and no nipple inversion. No distinct masses palpated. Right axilla no adenopathy. No mass.       Assessment:     #1 patient concern for asymmetry right axilla versus left with no distinct mass palpated  #2 history of chronic right shoulder pain. She's had  some difficulties with transfers especially from the commode at home    Plan:     -Set up referral for home OT assessment -We discussed options of diagnostic mammogram and ultrasound of the right (which she is not interested at this time) versus referral to general surgeon for second opinion and she prefers the latter.  Eulas Post MD [facility] at [facility]

## 2017-02-12 NOTE — Patient Instructions (Signed)
We will set up OT assessment.

## 2017-02-20 ENCOUNTER — Telehealth: Payer: Self-pay | Admitting: Family Medicine

## 2017-02-20 NOTE — Telephone Encounter (Signed)
OK 

## 2017-02-20 NOTE — Telephone Encounter (Signed)
Caryl Pina with [facility] would like verbal for PT.

## 2017-02-20 NOTE — Telephone Encounter (Signed)
Verbal orders given to Ashley. 

## 2017-03-06 ENCOUNTER — Telehealth: Payer: Self-pay | Admitting: Family Medicine

## 2017-03-06 DIAGNOSIS — G600 Hereditary motor and sensory neuropathy: Secondary | ICD-10-CM

## 2017-03-06 NOTE — Telephone Encounter (Signed)
Patient's husband dropped off forms for patient to be filled out.  -Fax to [phone] upon completion  -Placed forms in Dr's folder

## 2017-03-11 NOTE — Telephone Encounter (Signed)
Per Dr. Elease Hashimoto, pt needs an appt for 30 minutes.  Scheduled her for 03/18/17 to go over form.

## 2017-03-15 DIAGNOSIS — Z1231 Encounter for screening mammogram for malignant neoplasm of breast: Secondary | ICD-10-CM | POA: Diagnosis not present

## 2017-03-15 NOTE — Telephone Encounter (Signed)
noted 

## 2017-03-15 NOTE — Telephone Encounter (Signed)
Haley Kennedy, left form on your desk.  Pt coming in Monday afternoon to see Dr. Elease Hashimoto.

## 2017-03-18 ENCOUNTER — Ambulatory Visit (INDEPENDENT_AMBULATORY_CARE_PROVIDER_SITE_OTHER): Payer: Medicare Other | Admitting: Family Medicine

## 2017-03-18 ENCOUNTER — Encounter: Payer: Self-pay | Admitting: Family Medicine

## 2017-03-18 VITALS — BP 110/74 | HR 64 | Temp 98.2°F

## 2017-03-18 DIAGNOSIS — G609 Hereditary and idiopathic neuropathy, unspecified: Secondary | ICD-10-CM | POA: Diagnosis not present

## 2017-03-18 DIAGNOSIS — G600 Hereditary motor and sensory neuropathy: Secondary | ICD-10-CM | POA: Diagnosis not present

## 2017-03-18 NOTE — Progress Notes (Signed)
Subjective:     Patient ID: Haley Kennedy, female   DOB: 1945-12-26, 71 y.o.   MRN: [id number]  HPI Patient here to have DMV forms completed. She has history of Charcot-Marie-Tooth disease which was diagnosed around age 20. She has distal weakness especially involving her median and ulnar nerve and also lower extremities. She had recent occupational therapy evaluation just couple weeks ago with driving test which she passed. She has hand controls to assist her. She's had no difficulty driving with these. She denies any visual difficulties. No history of cardiovascular or pulmonary difficulties. No history of mental illness. No history of substance abuse. No endocrine disorder. Takes no medications. No history of syncope.  Past Medical History:  Diagnosis Date  . Allergy   . Arthritis    low back and neck  . Asthma    as a child  . Chronic back pain   . CMT (Charcot-Marie-Tooth disease)   . Diverticulosis   . Gastric ulcer   . Hyperlipidemia   . Hyperlipidemia   . Peripheral neuropathy   . Rotator cuff (capsule) sprain 07/09/2011  . Shortness of breath    with exertion;pt states its bc shes not in shape   . Swelling    from knee down;takes furosemide daily  . 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 Ulcer    Past Surgical History:  Procedure Laterality Date  . ABDOMINAL HYSTERECTOMY     100yrs ago  . BACK SURGERY     after back surgery had to have iron infusion  . BACK SURGERY     2009  . CHOLECYSTECTOMY     1997  . [place]  . FRACTURE SURGERY    . KNEE SURGERY    . SPINE SURGERY    . TONSILLECTOMY      reports that she quit smoking about 38 years ago. She has never used smokeless tobacco. She reports that she does not drink alcohol or use drugs. family history includes Cancer in her father; Pneumonia in her father. Allergies  Allergen Reactions  . Corticosteroids     CMT Disease  . Penicillins Other (See Comments)    Increased body  temp  . 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 Vincristine     CMT disease  . Adhesive [Tape] Rash    plastic  . Ciprofloxacin Rash     Review of Systems  Constitutional: Negative for fatigue.  Eyes: Negative for visual disturbance.  Respiratory: Negative for cough, chest tightness, shortness of breath and wheezing.   Cardiovascular: Negative for chest pain, palpitations and leg swelling.  Genitourinary: Negative for dysuria.  Neurological: Negative for dizziness, seizures, syncope, light-headedness and headaches.  Psychiatric/Behavioral: Negative for confusion.       Objective:   Physical Exam  Constitutional: She is oriented to person, place, and time. She appears well-developed and well-nourished.  Eyes: Pupils are equal, round, and reactive to light.  Neck: Neck supple. No JVD present. No thyromegaly present.  Cardiovascular: Normal rate and regular rhythm.  Exam reveals no gallop.   Pulmonary/Chest: Effort normal and breath sounds normal. No respiratory distress. She has no wheezes. She has no rales.  Musculoskeletal: She exhibits no edema.  Neurological: She is alert and oriented to person, place, and time.  Patient has distal weakness involving ulnar nerve and median nerve that she has fairly well preserved grip  strength. She has AFO braces on both lower extremities and weakness with plantar flexion dorsiflexion and knee extension bilaterally       Assessment:     History of Charcot Marie tooth disease with neuropathy related to this.  She has excellent proximal muscle strength in her shoulders and hips and her weakness is distal    Plan:     -Forms completed for DMV. -She must use hand assistive devices at all times -Recent occupational therapy evaluation and road test as above  Eulas Post MD [facility] at [facility]

## 2017-03-19 ENCOUNTER — Telehealth: Payer: Self-pay | Admitting: Family Medicine

## 2017-03-19 DIAGNOSIS — N644 Mastodynia: Secondary | ICD-10-CM

## 2017-03-19 NOTE — Telephone Encounter (Signed)
Pt would like results of her mammogram. Pt states she is going somewhere else for her breast tomorrow, but is still inquiring about the results.

## 2017-03-19 NOTE — Telephone Encounter (Signed)
Will return patient's call when the final report comes in.

## 2017-03-20 DIAGNOSIS — R2231 Localized swelling, mass and lump, right upper limb: Secondary | ICD-10-CM | POA: Diagnosis not present

## 2017-03-22 NOTE — Telephone Encounter (Signed)
Pt is requesting to  Have order for  ultrasound on right breast only. Pt said [facility] is requesting her to have additional imaging and ultrasound on right breast Pt is refusing additional imaging. [facility] fax [phone]

## 2017-03-25 NOTE — Telephone Encounter (Signed)
Spoke with imaging at [facility] and order placed with verbal order given.

## 2017-03-25 NOTE — Telephone Encounter (Signed)
OK 

## 2017-04-22 ENCOUNTER — Other Ambulatory Visit: Payer: Self-pay | Admitting: Family Medicine

## 2017-04-22 DIAGNOSIS — R928 Other abnormal and inconclusive findings on diagnostic imaging of breast: Secondary | ICD-10-CM

## 2017-05-16 ENCOUNTER — Encounter: Payer: Self-pay | Admitting: Family Medicine

## 2017-06-26 ENCOUNTER — Other Ambulatory Visit: Payer: Self-pay | Admitting: Family Medicine

## 2017-06-26 DIAGNOSIS — R928 Other abnormal and inconclusive findings on diagnostic imaging of breast: Secondary | ICD-10-CM

## 2017-07-11 ENCOUNTER — Other Ambulatory Visit: Payer: Self-pay | Admitting: Family Medicine

## 2017-07-11 ENCOUNTER — Ambulatory Visit
Admission: RE | Admit: 2017-07-11 | Discharge: 2017-07-11 | Disposition: A | Discharge status: Home / Self Care | Payer: Medicare Other | Source: Ambulatory Visit | Attending: Family Medicine | Admitting: Family Medicine

## 2017-07-11 DIAGNOSIS — N6489 Other specified disorders of breast: Secondary | ICD-10-CM | POA: Diagnosis not present

## 2017-07-11 DIAGNOSIS — R2231 Localized swelling, mass and lump, right upper limb: Secondary | ICD-10-CM

## 2017-07-11 DIAGNOSIS — R928 Other abnormal and inconclusive findings on diagnostic imaging of breast: Secondary | ICD-10-CM

## 2017-08-25 DIAGNOSIS — H2513 Age-related nuclear cataract, bilateral: Secondary | ICD-10-CM | POA: Diagnosis not present

## 2017-08-25 DIAGNOSIS — H40033 Anatomical narrow angle, bilateral: Secondary | ICD-10-CM | POA: Diagnosis not present

## 2017-09-02 DIAGNOSIS — H2511 Age-related nuclear cataract, right eye: Secondary | ICD-10-CM | POA: Diagnosis not present

## 2017-09-06 DIAGNOSIS — H25811 Combined forms of age-related cataract, right eye: Secondary | ICD-10-CM | POA: Diagnosis not present

## 2017-09-06 DIAGNOSIS — H2511 Age-related nuclear cataract, right eye: Secondary | ICD-10-CM | POA: Diagnosis not present

## 2017-09-12 DIAGNOSIS — H04123 Dry eye syndrome of bilateral lacrimal glands: Secondary | ICD-10-CM | POA: Diagnosis not present

## 2017-10-03 DIAGNOSIS — H04123 Dry eye syndrome of bilateral lacrimal glands: Secondary | ICD-10-CM | POA: Diagnosis not present

## 2017-12-11 ENCOUNTER — Ambulatory Visit: Payer: Medicare Other | Admitting: Family Medicine

## 2018-02-18 DIAGNOSIS — Z01818 Encounter for other preprocedural examination: Secondary | ICD-10-CM | POA: Diagnosis not present

## 2018-02-18 DIAGNOSIS — H04552 Acquired stenosis of left nasolacrimal duct: Secondary | ICD-10-CM | POA: Diagnosis not present

## 2018-02-25 ENCOUNTER — Ambulatory Visit (INDEPENDENT_AMBULATORY_CARE_PROVIDER_SITE_OTHER): Payer: Medicare Other | Admitting: Family Medicine

## 2018-02-25 ENCOUNTER — Encounter: Payer: Self-pay | Admitting: Family Medicine

## 2018-02-25 VITALS — BP 120/76 | HR 69 | Temp 98.1°F

## 2018-02-25 DIAGNOSIS — R233 Spontaneous ecchymoses: Secondary | ICD-10-CM | POA: Diagnosis not present

## 2018-02-25 LAB — COMPREHENSIVE METABOLIC PANEL
ALT: 27 U/L (ref 0–35)
AST: 20 U/L (ref 0–37)
Albumin: 4.2 g/dL (ref 3.5–5.2)
Alkaline Phosphatase: 48 U/L (ref 39–117)
BUN: 8 mg/dL (ref 6–23)
CO2: 31 meq/L (ref 19–32)
Calcium: 9.2 mg/dL (ref 8.4–10.5)
Chloride: 102 mEq/L (ref 96–112)
Creatinine, Ser: 0.3 mg/dL — ABNORMAL LOW (ref 0.40–1.20)
GFR: 233.88 mL/min (ref >60.00)
GLUCOSE: 89 mg/dL (ref 70–99)
POTASSIUM: 4.5 meq/L (ref 3.5–5.1)
Sodium: 141 mEq/L (ref 135–145)
Total Bilirubin: 0.5 mg/dL (ref 0.2–1.2)
Total Protein: 7 g/dL (ref 6.0–8.3)

## 2018-02-25 LAB — CBC WITH DIFFERENTIAL/PLATELET
BASOS ABS: 0 10*3/uL (ref 0.0–0.1)
Basophils Relative: 0.5 % (ref 0.0–3.0)
Eosinophils Absolute: 0.2 10*3/uL (ref 0.0–0.7)
Eosinophils Relative: 2.3 % (ref 0.0–5.0)
HCT: 37.9 % (ref 36.0–46.0)
Hemoglobin: 12.6 g/dL (ref 12.0–15.0)
LYMPHS ABS: 2.7 10*3/uL (ref 0.7–4.0)
Lymphocytes Relative: 40.9 % (ref 12.0–46.0)
MCHC: 33.3 g/dL (ref 30.0–36.0)
MCV: 90.7 fl (ref 78.0–100.0)
MONOS PCT: 7.3 % (ref 3.0–12.0)
Monocytes Absolute: 0.5 10*3/uL (ref 0.1–1.0)
NEUTROS ABS: 3.2 10*3/uL (ref 1.4–7.7)
NEUTROS PCT: 49 % (ref 43.0–77.0)
PLATELETS: 225 10*3/uL (ref 150.0–400.0)
RBC: 4.18 Mil/uL (ref 3.87–5.11)
RDW: 13.4 % (ref 11.5–15.5)
WBC: 6.6 10*3/uL (ref 4.0–10.5)

## 2018-02-25 NOTE — Progress Notes (Signed)
Subjective:     Patient ID: Haley Kennedy, female   DOB: 01-Nov-1945, 72 y.o.   MRN: [id number]  HPI Patient seen with skin "rash ". Present for over month. Main distribution is forearms and also some on her arms and upper anterior chest. She has not noted any lower extremities. Basically asymptomatic. She tried changing soaps and also took some Benadryl without any change. She felt that her artificial sweetener may be causing this and she changed that without any improvement.  She denies any fevers, headaches, arthralgias, or any other skin rashes. Does not take any prescription medications. She takes omega-3 supplement also grape seed extract. She's not noted any easy bruising or any bleeding issues such as nosebleeds or gum bleeding. Denies any prior history of petechiae. No recent tick bites. Very limited sun exposure.  Past Medical History:  Diagnosis Date  . Allergy   . Arthritis    low back and neck  . Asthma    as a child  . Chronic back pain   . CMT (Charcot-Marie-Tooth disease)   . Diverticulosis   . Gastric ulcer   . Hyperlipidemia   . Hyperlipidemia   . Peripheral neuropathy   . Rotator cuff (capsule) sprain 07/09/2011  . Shortness of breath    with exertion;pt states its bc shes not in shape   . Swelling    from knee down;takes furosemide daily  . 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 Ulcer    Past Surgical History:  Procedure Laterality Date  . ABDOMINAL HYSTERECTOMY     4yrs ago  . BACK SURGERY     after back surgery had to have iron infusion  . BACK SURGERY     2009  . CHOLECYSTECTOMY     1997  . [place]  . FRACTURE SURGERY    . KNEE SURGERY    . SPINE SURGERY    . TONSILLECTOMY      reports that she quit smoking about 39 years ago. She has never used smokeless tobacco. She reports that she does not drink alcohol or use drugs. family history includes Cancer in her father; Pneumonia in her father. Allergies  Allergen  Reactions  . Corticosteroids     CMT Disease  . Penicillins Other (See Comments)    Increased body temp  . 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 Vincristine     CMT disease  . Adhesive [Tape] Rash    plastic  . Ciprofloxacin Rash     Review of Systems  Constitutional: Negative for chills and fever.  HENT: Negative for congestion and sore throat.   Respiratory: Negative for cough and shortness of breath.   Cardiovascular: Negative for chest pain.  Musculoskeletal: Negative for arthralgias.  Skin: Positive for rash.  Neurological: Negative for headaches.  Hematological: Negative for adenopathy. Does not bruise/bleed easily.       Objective:   Physical Exam  Constitutional: She appears well-developed and well-nourished.  HENT:  Head: Normocephalic and atraumatic.  Mouth/Throat: Oropharynx is clear and moist.  Neck: Neck supple.  Cardiovascular: Normal rate and regular rhythm.  Pulmonary/Chest: Effort normal and breath sounds normal.  Musculoskeletal: She exhibits no edema.  Lymphadenopathy:    She has no cervical adenopathy.  Skin: Rash noted.  Patient has some pinpoint nonblanching erythematous petechial type lesions mostly on her forearms with a few scattered areas  on her arms and anterior chest as well as a few on her lower legs bilaterally.       Assessment:     Scattered petechiae. She states these have been present for over month. Etiology unclear. She does not have any other evidence for bleeding issues    Plan:     -Start with labs with CBC and comprehensive metabolic panel. Patient unable to give urine specimen today but she will bring this back.- for urinalysis -Will likely need skin biopsy to help further clarify. We will wait on lab work first and schedule once labs are back if labs unrevealing  Eulas Post MD [name] at [facility]

## 2018-02-25 NOTE — Patient Instructions (Signed)
You appear to have several petechiae on your extremities and chest  We need to start with some screening labs.  If they are normal, would consider skin biopsy.

## 2018-02-26 ENCOUNTER — Telehealth: Payer: Self-pay | Admitting: Family Medicine

## 2018-02-26 DIAGNOSIS — H10013 Acute follicular conjunctivitis, bilateral: Secondary | ICD-10-CM | POA: Diagnosis not present

## 2018-02-26 NOTE — Telephone Encounter (Signed)
Copied from [place] [id number]. Topic: Quick Communication - See Telephone Encounter >> Feb 26, 2018  4:47 PM Rutherford Nail, NT wrote: CRM for notification. See Telephone encounter for: 02/26/18. Patient states that she was seen in the office yesterday. Thought that they would have her come back in for some tests that Dr Elease Hashimoto mentioned, but never heard anything about coming back in to do those. Would like a call back from Dr [employer] assistant. Please advise.  CB#: [phone]

## 2018-02-26 NOTE — Telephone Encounter (Signed)
Spoke with patient and an appointment made per patient request.

## 2018-03-03 ENCOUNTER — Encounter: Payer: Self-pay | Admitting: Family Medicine

## 2018-03-03 ENCOUNTER — Ambulatory Visit (INDEPENDENT_AMBULATORY_CARE_PROVIDER_SITE_OTHER): Payer: Medicare Other | Admitting: Family Medicine

## 2018-03-03 VITALS — BP 120/76 | HR 69 | Temp 97.8°F

## 2018-03-03 DIAGNOSIS — R300 Dysuria: Secondary | ICD-10-CM

## 2018-03-03 DIAGNOSIS — M542 Cervicalgia: Secondary | ICD-10-CM | POA: Diagnosis not present

## 2018-03-03 DIAGNOSIS — E785 Hyperlipidemia, unspecified: Secondary | ICD-10-CM | POA: Diagnosis not present

## 2018-03-03 DIAGNOSIS — R233 Spontaneous ecchymoses: Secondary | ICD-10-CM

## 2018-03-03 DIAGNOSIS — R635 Abnormal weight gain: Secondary | ICD-10-CM | POA: Diagnosis not present

## 2018-03-03 LAB — POCT URINALYSIS DIPSTICK
Bilirubin, UA: NEGATIVE
Blood, UA: NEGATIVE
Glucose, UA: NEGATIVE
Ketones, UA: NEGATIVE
Leukocytes, UA: NEGATIVE
Nitrite, UA: NEGATIVE
PROTEIN UA: NEGATIVE
SPEC GRAV UA: 1.015 (ref 1.010–1.025)
UROBILINOGEN UA: 0.2 U/dL
pH, UA: 6 (ref 5.0–8.0)

## 2018-03-03 LAB — LIPID PANEL
CHOLESTEROL: 217 mg/dL — AB (ref 0–200)
HDL: 67.5 mg/dL (ref >39.00)
LDL Cholesterol: 127 mg/dL — ABNORMAL HIGH (ref 0–99)
NonHDL: 149.36
TRIGLYCERIDES: 111 mg/dL (ref 0.0–149.0)
Total CHOL/HDL Ratio: 3
VLDL: 22.2 mg/dL (ref 0.0–40.0)

## 2018-03-03 LAB — TSH: TSH: 3.18 u[IU]/mL (ref 0.35–4.50)

## 2018-03-03 NOTE — Progress Notes (Signed)
Subjective:     Patient ID: Haley Kennedy, female   DOB: 07-06-1946, 72 y.o.   MRN: [id number]  HPI Patient seen recently for faint petechial type rash on her extremities. This is nonblanching and we obtained labs including comprehensive metabolic panel and CBC and these were unremarkable. No thrombocytopenia. No other bleeding issues. Patient had been taking fish oil and grape seed extract and she was convinced may be related those. She has backed off taking those. She thinks her rash is slightly faded. No recent fevers or chills. No increased arthralgias.  Other new issue is left-sided neck pain. She's had some intermittent pains in the past. She's had previous x-rays which showed degenerative changes. No radiculitis symptoms. Left-sided neck pain is the base of the cervical spine and radiates somewhat anterior. Denies upper extremity weakness or numbness.  Occasional sharp quality to pain.  Hyperlipidemia. Patient requesting follow-up lipids today. She does not take any statins.  Past Medical History:  Diagnosis Date  . Allergy   . Arthritis    low back and neck  . Asthma    as a child  . Chronic back pain   . CMT (Charcot-Marie-Tooth disease)   . Diverticulosis   . Gastric ulcer   . Hyperlipidemia   . Hyperlipidemia   . Peripheral neuropathy   . Rotator cuff (capsule) sprain 07/09/2011  . Shortness of breath    with exertion;pt states its bc shes not in shape   . Swelling    from knee down;takes furosemide daily  . 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 Ulcer    Past Surgical History:  Procedure Laterality Date  . ABDOMINAL HYSTERECTOMY     75yrs ago  . BACK SURGERY     after back surgery had to have iron infusion  . BACK SURGERY     2009  . CHOLECYSTECTOMY     1997  . [place]  . FRACTURE SURGERY    . KNEE SURGERY    . SPINE SURGERY    . TONSILLECTOMY      reports that she quit smoking about 39 years ago. She has never used  smokeless tobacco. She reports that she does not drink alcohol or use drugs. family history includes Cancer in her father; Pneumonia in her father. Allergies  Allergen Reactions  . Corticosteroids     CMT Disease  . Penicillins Other (See Comments)    Increased body temp  . 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 Vincristine     CMT disease  . Adhesive [Tape] Rash    plastic  . Ciprofloxacin Rash     Review of Systems  Constitutional: Negative for chills, fatigue and fever.  Eyes: Negative for visual disturbance.  Respiratory: Negative for cough, chest tightness, shortness of breath and wheezing.   Cardiovascular: Negative for chest pain, palpitations and leg swelling.  Musculoskeletal: Positive for neck pain.  Neurological: Negative for dizziness, seizures, syncope, weakness, light-headedness, numbness and headaches.       Objective:   Physical Exam  Constitutional: She appears well-developed and well-nourished.  Eyes: Pupils are equal, round, and reactive to light.  Neck: Neck supple. No JVD present. No thyromegaly present.  Cardiovascular: Normal rate and regular rhythm. Exam reveals no gallop.  Pulmonary/Chest: Effort normal and breath sounds normal. No respiratory distress. She has no wheezes. She has no rales.  Musculoskeletal:  She exhibits no edema.  Patient has some muscle atrophy involving her hands and upper extremities consistent with her Charcot-Marie-Tooth disease  Neurological: She is alert.  Skin:  Faint rash which is pinpoint erythematous mostly forearms with some involvement of the arms as well. Nontender       Assessment:     #1 left-sided neck pain. History of known degenerative spondylosis. Pain relatively mild  #2 faint rash upper extremities. Recent labs including CBC unremarkable  #3 hyperlipidemia    Plan:     -Check lipid panel -Discuss further treatment options for her neck pain at this  point she wishes to observe. -We offered dermatology appointment versus skin biopsy for her rash and at this point she declines both. She will touch base if rash is not further improved over the next couple of weeks  Eulas Post MD [facility] [place]

## 2018-03-19 DIAGNOSIS — H04123 Dry eye syndrome of bilateral lacrimal glands: Secondary | ICD-10-CM | POA: Diagnosis not present

## 2018-03-27 DIAGNOSIS — D485 Neoplasm of uncertain behavior of skin: Secondary | ICD-10-CM | POA: Diagnosis not present

## 2018-03-27 DIAGNOSIS — L958 Other vasculitis limited to the skin: Secondary | ICD-10-CM | POA: Diagnosis not present

## 2018-06-17 ENCOUNTER — Telehealth: Payer: Self-pay | Admitting: Family Medicine

## 2018-06-17 NOTE — Telephone Encounter (Signed)
Please advise 

## 2018-06-17 NOTE — Telephone Encounter (Signed)
We should probably get her back in to make sure we are ordering the appropriate one.

## 2018-06-17 NOTE — Telephone Encounter (Signed)
Copied from [place] [id number]. Topic: Quick Communication - See Telephone Encounter >> Jun 17, 2018  3:00 PM Haley Kennedy wrote: CRM for notification. See Telephone encounter for: 06/17/18.  Patient is requesting a prescription for a new power wheel chair. Please fax to [phone] [facility] If patient needs an appointment she said she will come in. She would like a nurse to call her back to make sure the right one is ordered.

## 2018-06-18 NOTE — Telephone Encounter (Signed)
Called patient and gave her message from Dr. Elease Hashimoto. Patient verbalized an understanding and is coming in for a visit on 06/25/18 at 11:15am.

## 2018-06-25 ENCOUNTER — Ambulatory Visit: Payer: Medicare Other | Admitting: Family Medicine

## 2018-06-27 ENCOUNTER — Ambulatory Visit (INDEPENDENT_AMBULATORY_CARE_PROVIDER_SITE_OTHER): Payer: Medicare Other | Admitting: Family Medicine

## 2018-06-27 ENCOUNTER — Encounter: Payer: Self-pay | Admitting: Family Medicine

## 2018-06-27 ENCOUNTER — Other Ambulatory Visit: Payer: Self-pay

## 2018-06-27 VITALS — BP 108/72 | HR 54 | Temp 97.5°F

## 2018-06-27 DIAGNOSIS — G609 Hereditary and idiopathic neuropathy, unspecified: Secondary | ICD-10-CM | POA: Diagnosis not present

## 2018-06-27 DIAGNOSIS — G600 Hereditary motor and sensory neuropathy: Secondary | ICD-10-CM

## 2018-06-27 DIAGNOSIS — M549 Dorsalgia, unspecified: Secondary | ICD-10-CM

## 2018-06-27 DIAGNOSIS — G8929 Other chronic pain: Secondary | ICD-10-CM

## 2018-06-27 NOTE — Progress Notes (Signed)
Subjective:     Patient ID: Haley Kennedy, female   DOB: 16-Dec-1945, 72 y.o.   MRN: [id number]  HPI Patient has chronic problems including Charcot-Marie-Tooth disease, peripheral neuropathy, essential tremor.  She has power wheelchair which she has had now for about 6 years.  She states that her current chair has required several replacement parts and she was told that she needed a new one.  She is here basically to start that process.  She has several activities of daily living that she is unable to perform without power mobility equipment.  These include: -Feeding, bathing, grooming, transferring room to room, and toileting  She specifically needs chair seat elevator to assist with toileting needs and also adjustable foot rest  She cannot use a cane or walker because of severe weakness from her Charcot-Marie-Tooth disease which prohibits walking with assistance.  She cannot use a manual wheelchair because of her upper extremity involvement with hands and forearm.  She is able to operate joystick controller.  We do not feel that a power operated vehicle or scooter would be adequate because she requires joystick controller, has poor trunk stability, needs adjustable height armrests, and also requires elevating leg rest.  We feel she has good cognitive capacity to operate power wheelchair and also has high motivation to use this.  Past Medical History:  Diagnosis Date  . Allergy   . Arthritis    low back and neck  . Asthma    as a child  . Chronic back pain   . CMT (Charcot-Marie-Tooth disease)   . Diverticulosis   . Gastric ulcer   . Hyperlipidemia   . Hyperlipidemia   . Peripheral neuropathy   . Rotator cuff (capsule) sprain 07/09/2011  . Shortness of breath    with exertion;pt states its bc shes not in shape   . Swelling    from knee down;takes furosemide daily  . 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 Ulcer    Past Surgical History:  Procedure Laterality  Date  . ABDOMINAL HYSTERECTOMY     37yrs ago  . BACK SURGERY     after back surgery had to have iron infusion  . BACK SURGERY     2009  . CHOLECYSTECTOMY     1997  . [place]  . FRACTURE SURGERY    . KNEE SURGERY    . SPINE SURGERY    . TONSILLECTOMY      reports that she quit smoking about 39 years ago. She has never used smokeless tobacco. She reports that she does not drink alcohol or use drugs. family history includes Cancer in her father; Pneumonia in her father. Allergies  Allergen Reactions  . Corticosteroids     CMT Disease  . Penicillins Other (See Comments)    Increased body temp  . 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 Vincristine     CMT disease  . Adhesive [Tape] Rash    plastic  . Ciprofloxacin Rash  . Phenylephrine-Guaifenesin Rash     Review of Systems  Constitutional: Negative for appetite change, chills, fever and unexpected weight change.  Respiratory: Negative for cough and shortness of breath.   Cardiovascular: Negative for chest pain.  Gastrointestinal: Negative for abdominal pain.  Genitourinary: Negative for dysuria.  Neurological: Positive for weakness. Negative for seizures.  Psychiatric/Behavioral: Negative for confusion.  Objective:   Physical Exam  Constitutional: She is oriented to person, place, and time. She appears well-developed and well-nourished.  Cardiovascular: Normal rate and regular rhythm.  Pulmonary/Chest: Effort normal and breath sounds normal.  Musculoskeletal: She exhibits no edema.  Neurological: She is alert and oriented to person, place, and time. No cranial nerve deficit.  She has atrophy involving the hands consistent with her Charcot-Marie-Tooth disease.  She has weakness of the upper extremities and lower extremities  Psychiatric: She has a normal mood and affect. Her behavior is normal. Judgment and thought content normal.       Assessment:      Charcot-Marie-Tooth disease with associated peripheral neuropathy.    Plan:     -Patient has multiple reasons as outlined above for power wheelchair.  We do not feel that other devices such as a cane or walker or scooter will meet her needs. -New prescription provided with documentation  Eulas Post MD [facility] at [facility]

## 2018-07-16 NOTE — Telephone Encounter (Signed)
Patient never heard back from [facility] about her power wheel chair after her appointment on 06/27/2018. She would like a call back to know when this was faxed and whether it was confirmed.

## 2018-07-16 NOTE — Telephone Encounter (Signed)
Called patient and LMOVM to return call  Haley Kennedy for [facility] to Discuss results / PCP / recommendations / Schedule patient  I called [facility] for the patient and was informed that they are still working on the order for the power wheel chair and the process was well noted and was informed that unfortunately this is a long process. An email is being sent to check the status of this process.  CRM Created.

## 2018-08-01 NOTE — Telephone Encounter (Signed)
Relation to pt: self  Call back number: [phone] or cell phone # [phone]     Reason for call:  Patient would like a follow up call today regarding the status of her power wheel chair, please advise

## 2018-08-04 NOTE — Telephone Encounter (Signed)
Called patient and LMOVM to return call  [place] for [facility] to Discuss results / PCP / recommendations / Schedule patient  Called and spoke to [place] with [place] and she stated that they were supposed to follow up with Mrs. Oats but she could not see any further notes and she has sent a message so hopefully tomorrow someone with [facility] will reach out to her with the status of her power wheel chair.  CRM Created.

## 2018-08-07 ENCOUNTER — Telehealth: Payer: Self-pay

## 2018-08-07 NOTE — Telephone Encounter (Signed)
Copied from [place] [id number]. Topic: Quick Sport and exercise psychologist Patient (Clinic Use ONLY) >> Aug 04, 2018  5:19 PM Fagge, Judith Part, CMA wrote: Reason for CRM: Called patient and LMOVM to return call  [place] for [facility] to Discuss results / PCP / recommendations / Schedule patient  Called and spoke to [facility] with [place] and she stated that they were supposed to follow up with Mrs. [name] but she could not see any further notes and she has sent a message so hopefully tomorrow someone with [facility] will reach out to her with the status of her power wheel chair. >> Aug 07, 2018  4:35 PM Bea Graff, NT wrote: Pt calling and states that [place] has not given her a call regarding her power wheelchair. She would like to see if someone in the office can check status with [facility] again.

## 2018-08-08 NOTE — Telephone Encounter (Signed)
Called Haley Kennedy at [facility] and left a detailed voice message to let her know that the patient is very concerned as it is going on 2 months now and the patient has not heard from anyone at [facility] in regards to her power wheel chair.

## 2018-08-08 NOTE — Telephone Encounter (Signed)
Merrilee Seashore with [facility] calling and states that they have followed up with the patient and she is scheduled for a physical therapy evaluation for her wheelchair. If there are any questions, please give him a call back. CB#: [phone] option 1

## 2018-08-13 ENCOUNTER — Ambulatory Visit (HOSPITAL_COMMUNITY): Payer: Medicare Other | Attending: Family Medicine

## 2018-08-13 DIAGNOSIS — M6281 Muscle weakness (generalized): Secondary | ICD-10-CM

## 2018-08-14 ENCOUNTER — Encounter (HOSPITAL_COMMUNITY): Payer: Self-pay

## 2018-08-14 ENCOUNTER — Other Ambulatory Visit: Payer: Self-pay

## 2018-08-14 NOTE — Therapy (Signed)
[place], [place], [address] Phone: [phone]   Fax:  [phone]  Occupational Therapy Wheelchair Evaluation  Patient Details  Name: Haley Kennedy MRN: [id number] Date of Birth: 1946/04/18 Referring Provider (OT): Haley Kennedy   Encounter Date: 08/13/2018  OT End of Session - 08/14/18 0805    Visit Number  1    Number of Visits  1    Authorization Type  [employer] medicare $40 co pay    OT Start Time  1516    OT Stop Time  1630    OT Time Calculation (min)  74 min    Activity Tolerance  Patient tolerated treatment well    Behavior During Therapy  [facility] for tasks assessed/performed       Past Medical History:  Diagnosis Date  . Allergy   . Arthritis    low back and neck  . Asthma    as a child  . Chronic back pain   . CMT (Charcot-Marie-Tooth disease)   . Diverticulosis   . Gastric ulcer   . Hyperlipidemia   . Hyperlipidemia   . Peripheral neuropathy   . Rotator cuff (capsule) sprain 07/09/2011  . Shortness of breath    with exertion;pt states its bc shes not in shape   . Swelling    from knee down;takes furosemide daily  . 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 Ulcer     Past Surgical History:  Procedure Laterality Date  . ABDOMINAL HYSTERECTOMY     75yrs ago  . BACK SURGERY     after back surgery had to have iron infusion  . BACK SURGERY     2009  . CHOLECYSTECTOMY     1997  . [place]  . FRACTURE SURGERY    . KNEE SURGERY    . SPINE SURGERY    . TONSILLECTOMY      There were no vitals filed for this visit.     [facility] OT Assessment - 08/14/18 0001      Assessment   Medical Diagnosis  wheelchair evaluation    Referring Provider (OT)  Haley Kennedy          Date: 08/13/18 Patient Name: Haley Kennedy Address: [address] [place], [place] [address] DOB: 03/20/1946      Haley Kennedy was seen today in this clinic for a powered wheelchair  evaluation. Haley Kennedy has a past medical history that includes Charcot-Maria-Tooth (CMT) disease, Arthritis, Asthma, Chronic back pain, Diverticulosis, Gastric ulcer, Hyperlipidemia, Peripheral neuropathy, Right RTC tear, and Essential tremor.  Her surgical history includes: Back surgery (2009), Foot surgery (1963), Knee surgery, and Right RTC repair (2012).  Haley Kennedy lives with her husband in a 1 level single story house. She completes all bathing, dressing, grooming, and simple meal prep tasks at modified independent level. Haley Kennedy reports she hasn't been able to walk since she was 72 years old. She relies on her present powered wheelchair for all mobility and completion of daily tasks. Her current powered wheelchair is 70 years old and has required several replacement parts to remain in use. Regardless of the replacement parts, her chair is frequently breaking down and is unsafe for use.    Haley Kennedy relies heavily on the seat elevation feature on her current powered wheelchair. In order to transfer safely, Haley Kennedy must have a level surface which requires her wheelchair  to be raised up or down. When transitioning from her wheelchair to the toilet, she elevates her wheelchair in order to hold the grab bars and stand. Due to her diagnosis of CMT disease, she must hyperextend/lock bilateral knees into extension to prevent her legs from buckling. She reports her peripheral neuropathy effects both her legs from knees down.     A FULL PHYSICAL ASSESSMENT REVEALS THE FOLLOWING    Existing Equipment:   Power wheelchair, cane, grab bars by toilet, ETB, Slideboard, B AFO's for drop foot, and manual wheelchair.  Transfers:   All functional transfers are completed at Modified Independence level. Haley Kennedy requires something to pull up on in addition to elevating her wheelchair seat as she is unable to stand up from a low surface.   Head and Neck:    AROM WFL   Trunk and Pelvis: Trunk: AROM is [facility] for needed daily  tasks.    Hip Unable to achieve full A/ROM bilateral hip flexion. MMT: 3-/5 for right and left.   Knees:  Able to achieve functional bilateral knee extension although not full range. Right and left knee extension/flexion strength: 3-/5. Hip adductors: 4/5. Hip abductors: 3/5.  Feet and Ankles: bilateral foot drop with strength at 3-/5 for left and right.   Upper Extremities: BUE A/ROM shoulder, elbow, and wrist WFL. Shoulder flexion, abduction, IR/er: 4/5. Bilateral bicep extension: 3/5. Bilateral bicep flexion: 4/5. Bilateral hands and forearms show signs of atrophy due to CMT disease which results in a weak gross grasp bilaterally.   Weight Shifting Ability:  Weight shifting ability is limited and not adequate to what is needed to prevent skin breakdown.  Skin Integrity:  Haley Kennedy reports skin irritation and breakdown caused by use of slideboard when transferring from chair to ETB in shower. She states that she mainly sponge bathes due to this reason.     Cognition:  WFL  Activity Tolerance: Haley Kennedy is wheelchair bound and non-ambulatory. She spends 95% of her day in her wheelchair or lift chair.    GOALS/OBJECTIVE OF SEATING INTERVENTION:  Recommendation: Haley Kennedy would benefit from a new powered wheelchair for use in her home and in the community. Her current powered wheelchair has shown signs of extensive wear and tear and replacement parts are no longer adequate to keep it running safely. Haley Kennedy is limited by her decreased BUE strength and degenerating nerves due to CMT disease and is unable to self-propel her manual wheelchair.  A scooter would not be beneficial for Haley Kennedy as she does not have adequate space in her home for required turning radius. Haley Kennedy is motivated and willing to use her power wheelchair with the appropriate seating adjustments and is physically and mentally able to operate her power wheelchair. A new powered wheelchair with an elevating seat will allow Haley Kennedy  to safely transfer out of her present chair and increase her ability to be more mobile during daily activities and her quality of life.  If you require any further information concerning Ms. Knoebel's positioning, independence or mobility needs; or any further information why a lesser device will not work, please do not hesitate to contact me at [facility], [address] [place], [place] [address] [phone].   I have no relationship with either the supplier or manufacturer of the equipment recommended.   Thank you for this referral,   ____________________        Ailene Ravel OTR/L, CBIS  [phone] (  main) [email or web address]                                     Plan - 08/14/18 0806    OT Frequency  One time visit    Consulted and Agree with Plan of Care  Patient       Patient will benefit from skilled therapeutic intervention in order to improve the following deficits and impairments:     Visit Diagnosis: Muscle weakness (generalized)    Problem List Patient Active Problem List   Diagnosis Date Noted  . Cold intolerance 10/01/2016  . Essential tremor 10/14/2014  . Charcot-Marie-Tooth disease 02/25/2012  . Chronic back pain 02/25/2012  . Peripheral neuropathy 02/25/2012  . Pulmonary nodule, left 09/12/2011  . Right rotator cuff tear 07/12/2011  . Rotator cuff (capsule) sprain 07/09/2011   Ailene Ravel, OTR/L,CBIS  [phone]  08/14/2018, 8:07 AM  [facility] [address] [place], [place], [address] Phone: [phone]   Fax:  [phone]  Name: SIA GABRIELSEN MRN: [id number] Date of Birth: 1945/11/29

## 2018-08-22 ENCOUNTER — Encounter: Payer: Self-pay | Admitting: *Deleted

## 2018-09-03 DIAGNOSIS — M25562 Pain in left knee: Secondary | ICD-10-CM | POA: Insufficient documentation

## 2018-09-03 DIAGNOSIS — M2392 Unspecified internal derangement of left knee: Secondary | ICD-10-CM | POA: Diagnosis not present

## 2018-09-03 DIAGNOSIS — S83412A Sprain of medial collateral ligament of left knee, initial encounter: Secondary | ICD-10-CM | POA: Diagnosis not present

## 2018-09-03 DIAGNOSIS — S83419A Sprain of medial collateral ligament of unspecified knee, initial encounter: Secondary | ICD-10-CM | POA: Insufficient documentation

## 2018-09-23 ENCOUNTER — Encounter (HOSPITAL_COMMUNITY): Payer: Self-pay

## 2018-10-13 DIAGNOSIS — M25562 Pain in left knee: Secondary | ICD-10-CM | POA: Diagnosis not present

## 2018-10-22 DIAGNOSIS — S83412A Sprain of medial collateral ligament of left knee, initial encounter: Secondary | ICD-10-CM | POA: Diagnosis not present

## 2018-11-24 ENCOUNTER — Telehealth: Payer: Self-pay

## 2018-11-24 NOTE — Telephone Encounter (Signed)
Author phoned pt. to assess interest in scheduling virtual awv. Pt. Stated she was not interested in doing awv, either with health coach or PCP.

## 2019-05-15 ENCOUNTER — Telehealth: Payer: Self-pay

## 2019-05-15 NOTE — Telephone Encounter (Signed)
Copied from [place] [id number]. Topic: General - Inquiry >> May 14, 2019 12:53 PM Haley Kennedy, NT wrote: Reason for CRM: Patient called in in regards to PCP called for her wheelchair letter he received stating her request was denied. Patient is wanting to know if PCP had called to appeal the letter. Please advise and call back is [phone].

## 2019-05-15 NOTE — Telephone Encounter (Signed)
I have not seen a letter about this. Are you aware of this?

## 2019-05-17 NOTE — Telephone Encounter (Signed)
I am trying to get in touch with someone with insurance for peer to peer consult.  Hopefully I can get this early this week.

## 2019-05-19 NOTE — Telephone Encounter (Signed)
Called patient and she stated that she has a deadline for the appeal. She would have asked [facility] to do by she stated that Dr. Iven Finn has retired and she is just not able to go through another EMG.  Do you have the paperwork where the request was denied? Have you been able to get this set up yet?

## 2019-05-19 NOTE — Telephone Encounter (Signed)
I am typing letter of appeal

## 2019-05-20 NOTE — Telephone Encounter (Signed)
Called patient and let her know that Dr. Elease Hashimoto is working on the appeal letter and to be on the look out for another approval or denial paper. Will contact us if she has not heard from [place].

## 2019-05-24 ENCOUNTER — Encounter: Payer: Self-pay | Admitting: Family Medicine

## 2019-06-22 ENCOUNTER — Encounter: Payer: Self-pay | Admitting: Internal Medicine

## 2019-06-22 ENCOUNTER — Other Ambulatory Visit: Payer: Self-pay

## 2019-06-22 ENCOUNTER — Telehealth (INDEPENDENT_AMBULATORY_CARE_PROVIDER_SITE_OTHER): Payer: Medicare Other | Admitting: Internal Medicine

## 2019-06-22 VITALS — Temp 98.9°F

## 2019-06-22 DIAGNOSIS — R21 Rash and other nonspecific skin eruption: Secondary | ICD-10-CM

## 2019-06-22 DIAGNOSIS — R6883 Chills (without fever): Secondary | ICD-10-CM

## 2019-06-22 NOTE — Progress Notes (Signed)
   Virtual Visit via Telephone Note  I connected with@ on 06/22/19 at 10:00 AM EDT by telephone and verified that I am speaking with the correct person using two identifiers.   I discussed the limitations, risks, security and privacy concerns of performing an evaluation and management service by telephone and the availability of in person appointments. I also discussed with the patient that there may be a patient responsible charge related to this service. The patient expressed understanding and agreed to proceed.  Location patient: home Location provider: work  office Participants present for the call: patient, provider Patient did not have a visit in the prior 7 days to address this/these issue(s).   History of Present Illness: Haley Kennedy    presents with  choills  And achy onset 3 days ago with HEADACHE  Now better  Onset after   Working with some type of cleaner  But no cough sob NVD or abd pain. No covid risk but her husband has copd and is "working at [employer] today"  Has  NL duct  obs and needs  Procedure but not infected at this time    Says temp 97.5 an up to 98.8 so  Taking tyleol every 7 hours and has rash she always gets from this  Fine bumps on trunk..... no uti gi sx  No cough or uri although says nose stuggy at night  No drainaage.   Observations/Objective: Patient sounds well and well on the phone. I do not appreciate any SOB. Speech and thought processing are grossly intact. Patient reported vitals:  Assessment and Plan: Chills without fever  For 3 days  Rash "from tylenol "   Follow Up Instructions:    Pt declines covid testing thinks she has a "sinus infection"     Disc that if so  She soul do sinus hygiene and give it time and if  After a week to 10 days not getting better or if serious localized pain and drainage  Then re evaluated . Also that covid can  Present with similar sx  As she has  And if gets dyspnea or very sick then seek emergent help.  She   requested antibiotic z pack . Declined to prescribe think risk more than benefit without hx sx symp of  Bacterial sinusitis at this time .   Will send this information to her PCP  For further follow up.    (she is not worried about the rash   Not peripheral or new or sounding vascular  ? Could be from "fever" and not the tylenol?)    [id number] [id number] [id number] 11-20 [id number] 21-30 I did not refer this patient for an OV in the next 24 hours for this/these issue(s).  I discussed the assessment and treatment plan with the patient. The patient was provided an opportunity to ask questions and all were answered. The patient  Was unconvinced about  Explanation of sinusitis and standard of care  rx  I will send info to dr Elease Hashimoto her PCP as to   Other  Or further intervention that  He may advise  And in fu.   The patient was advised to call back or seek an in-person evaluation if the symptoms worsen or if the condition fails to improve as anticipated. And when to seek emergent care ,   I provided  12  minutes of non-face-to-face time during this encounter.   Shanon Ace, MD

## 2019-07-10 ENCOUNTER — Telehealth: Payer: Self-pay | Admitting: *Deleted

## 2019-07-10 NOTE — Telephone Encounter (Signed)
Copied from [place] [id number]. Topic: General - Inquiry >> Jul 10, 2019 11:46 AM Richardo Priest, NT wrote: Reason for CRM: Pt called in stating she will be dropping off paperwork for Dr.Burchette to fill out as she drives with hand controls and received a letter that PCP has to fill out information or her license will be pulled. Please advise.

## 2019-07-13 ENCOUNTER — Telehealth: Payer: Self-pay | Admitting: Family Medicine

## 2019-07-13 NOTE — Telephone Encounter (Signed)
Pts husband came in and dropped off [facility] for the provider to complete.  Upon the completion pt would like for it to be mailed or faxed Mailing address: [facility] [address], [place], [place], [place] [address] or fax to  [phone].  Form placed in providers folder for completion.

## 2019-07-14 NOTE — Telephone Encounter (Signed)
This is also further information about the previous message that was sent to you for this patient for her forms.

## 2019-07-14 NOTE — Telephone Encounter (Signed)
Forms have been placed in red folder on desk. Please return when complete. Thank you!

## 2019-07-28 ENCOUNTER — Other Ambulatory Visit: Payer: Self-pay

## 2019-07-29 ENCOUNTER — Ambulatory Visit (INDEPENDENT_AMBULATORY_CARE_PROVIDER_SITE_OTHER): Payer: Medicare Other | Admitting: Family Medicine

## 2019-07-29 ENCOUNTER — Encounter: Payer: Self-pay | Admitting: Family Medicine

## 2019-07-29 VITALS — BP 118/74 | HR 67 | Temp 97.3°F

## 2019-07-29 DIAGNOSIS — G600 Hereditary motor and sensory neuropathy: Secondary | ICD-10-CM

## 2019-07-29 DIAGNOSIS — Z23 Encounter for immunization: Secondary | ICD-10-CM | POA: Diagnosis not present

## 2019-07-29 NOTE — Patient Instructions (Signed)
Set up physical.  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

## 2019-07-29 NOTE — Progress Notes (Signed)
Subjective:     Patient ID: Haley Kennedy, female   DOB: 1946/08/24, 73 y.o.   MRN: [id number]  HPI   Ms. Weismann is here to evaluate to have DMV forms completed. She has history of Charcot-Marie-Tooth disease which was diagnosed around age 63. She ambulates very little and mostly confined to wheelchair. She has weakness predominately involving lower extremities and does have some distal weakness involving median and ulnar nerves of upper extremities. She has hand controls and has no difficulties operating with hand controls. She had previous Occupational Therapy evaluation back about a year and a half ago. She had no problems with passing driving test then.  Otherwise fairly good health overall. No history of pulmonary, cardiovascular, or endocrine disorder. Takes no regular medications. No history of substance abuse or mental illness. No history of dizziness or syncope.  She did have recent respiratory illness back in October. Her husband had no symptoms. She was eventually treated with Zithromax and symptoms fully resolved. She had some questions regarding possible COVID-19 antibody testing  Past Medical History:  Diagnosis Date  . Allergy   . Arthritis    low back and neck  . Asthma    as a child  . Chronic back pain   . CMT (Charcot-Marie-Tooth disease)   . Diverticulosis   . Gastric ulcer   . Hyperlipidemia   . Hyperlipidemia   . Peripheral neuropathy   . Rotator cuff (capsule) sprain 07/09/2011  . Shortness of breath    with exertion;pt states its bc shes not in shape   . Swelling    from knee down;takes furosemide daily  . 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 Ulcer    Past Surgical History:  Procedure Laterality Date  . ABDOMINAL HYSTERECTOMY     73yrs ago  . BACK SURGERY     after back surgery had to have iron infusion  . BACK SURGERY     2009  . CHOLECYSTECTOMY     1997  . [place]  . FRACTURE SURGERY    . KNEE SURGERY    .  SPINE SURGERY    . TONSILLECTOMY      reports that she quit smoking about 40 years ago. She has never used smokeless tobacco. She reports that she does not drink alcohol or use drugs. family history includes Cancer in her father; Pneumonia in her father. Allergies  Allergen Reactions  . Corticosteroids     CMT Disease  . Nsaids   . Penicillins Other (See Comments)    Increased body temp  . 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 Vincristine     CMT disease  . Adhesive [Tape] Rash    plastic  . Ciprofloxacin Rash  . Phenylephrine-Guaifenesin Rash     Review of Systems  Constitutional: Negative for fatigue.  Eyes: Negative for visual disturbance.  Respiratory: Negative for cough, chest tightness, shortness of breath and wheezing.   Cardiovascular: Negative for chest pain, palpitations and leg swelling.  Gastrointestinal: Negative for abdominal pain.  Genitourinary: Negative for dysuria.  Neurological: Negative for dizziness, seizures, syncope, weakness, light-headedness and headaches.       Objective:   Physical Exam Vitals signs reviewed.  Constitutional:      Appearance: Normal appearance.  Neck:     Musculoskeletal: Neck supple.     Comments: She has good range of motion  with flexion, extension, lateral bending, and rotation to the right and left. Cardiovascular:     Rate and Rhythm: Normal rate and regular rhythm.  Pulmonary:     Effort: Pulmonary effort is normal.     Breath sounds: Normal breath sounds.  Musculoskeletal:     Right lower leg: No edema.     Left lower leg: No edema.  Neurological:     Mental Status: She is alert.     Comments: She has some obvious interosseous muscle wasting of her hands. She still has good grip strength though bilaterally.  She has diminished strength in lower extremities with dorsiflexion, plantarflexion, knee extension, and hip flexion bilaterally        Assessment:      #1 longstanding history of Charcot-Marie-Tooth disease with weakness lower extremities and distal upper extremity weakness involving median and ulnar nerve distribution  #2 recent respiratory illness-recovered    Plan:     -DMV forms completed  -Patient will schedule complete physical. We will readdress issue of whether to check Covid 19 IgG antibodies then  -flu vaccine given.    Eulas Post MD [facility]

## 2019-08-05 NOTE — Telephone Encounter (Signed)
Forms have been completed and I have faxed them to the [facility] today and also mailing to the [facility]. Called patient to let her know. Patient verbalized an understanding.

## 2019-08-06 NOTE — Telephone Encounter (Signed)
I have made a file copy and mailed the original to the [facility].

## 2019-10-21 ENCOUNTER — Other Ambulatory Visit: Payer: Self-pay | Admitting: Family Medicine

## 2019-10-21 ENCOUNTER — Telehealth: Payer: Self-pay | Admitting: Family Medicine

## 2019-10-21 NOTE — Telephone Encounter (Signed)
pt is requesting an order for a mammogram both breast  and Ultrasound 336  [phone]

## 2019-10-21 NOTE — Telephone Encounter (Signed)
Okay to order?

## 2019-10-22 NOTE — Telephone Encounter (Signed)
If she is asking for a diagnostic vs screening mammogram we are basing that on a finding (eg mass).  Is she asking for diagnostic mammogram?   If so we should see in office to evaluate her concern.   If just wanting screening mammogram OK to order.

## 2019-10-22 NOTE — Telephone Encounter (Signed)
Spoke with patient and reviewed Dr Erick Blinks recommendations.  Patient states she "can order her own mammogram".

## 2019-11-16 ENCOUNTER — Other Ambulatory Visit: Payer: Self-pay

## 2019-11-17 ENCOUNTER — Encounter: Payer: Self-pay | Admitting: Family Medicine

## 2019-11-17 ENCOUNTER — Ambulatory Visit (INDEPENDENT_AMBULATORY_CARE_PROVIDER_SITE_OTHER): Payer: Medicare Other | Admitting: Family Medicine

## 2019-11-17 VITALS — BP 120/72 | HR 65 | Temp 97.7°F

## 2019-11-17 DIAGNOSIS — J392 Other diseases of pharynx: Secondary | ICD-10-CM

## 2019-11-17 DIAGNOSIS — M79621 Pain in right upper arm: Secondary | ICD-10-CM
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
## 2019-11-17 NOTE — Patient Instructions (Signed)
Set up physical exam  I will set up ultrasound and mammogram .

## 2019-11-20 ENCOUNTER — Other Ambulatory Visit: Payer: Self-pay | Admitting: Family Medicine

## 2019-11-20 DIAGNOSIS — M79621 Pain in right upper arm: Secondary | ICD-10-CM

## 2019-12-09 ENCOUNTER — Ambulatory Visit
Admission: RE | Admit: 2019-12-09 | Discharge: 2019-12-09 | Disposition: A | Discharge status: Home / Self Care | Payer: Medicare Other | Source: Ambulatory Visit | Attending: Family Medicine | Admitting: Family Medicine

## 2019-12-09 ENCOUNTER — Other Ambulatory Visit: Payer: Self-pay

## 2019-12-09 ENCOUNTER — Other Ambulatory Visit: Payer: Self-pay | Admitting: Family Medicine

## 2019-12-09 DIAGNOSIS — M79621 Pain in right upper arm: Secondary | ICD-10-CM

## 2019-12-09 DIAGNOSIS — N6489 Other specified disorders of breast: Secondary | ICD-10-CM | POA: Diagnosis not present

## 2019-12-09 DIAGNOSIS — R928 Other abnormal and inconclusive findings on diagnostic imaging of breast: Secondary | ICD-10-CM | POA: Diagnosis not present

## 2019-12-29 DIAGNOSIS — H04222 Epiphora due to insufficient drainage, left lacrimal gland: Secondary | ICD-10-CM | POA: Diagnosis not present

## 2019-12-29 DIAGNOSIS — H04552 Acquired stenosis of left nasolacrimal duct: Secondary | ICD-10-CM | POA: Diagnosis not present

## 2020-03-22 ENCOUNTER — Other Ambulatory Visit: Payer: Self-pay | Admitting: *Deleted

## 2020-03-22 ENCOUNTER — Other Ambulatory Visit: Payer: Self-pay

## 2020-03-22 ENCOUNTER — Encounter: Payer: Self-pay | Admitting: Family Medicine

## 2020-03-22 ENCOUNTER — Ambulatory Visit (INDEPENDENT_AMBULATORY_CARE_PROVIDER_SITE_OTHER): Payer: Medicare Other | Admitting: Family Medicine

## 2020-03-22 VITALS — BP 122/74 | HR 70 | Temp 97.5°F

## 2020-03-22 DIAGNOSIS — J392 Other diseases of pharynx: Secondary | ICD-10-CM

## 2020-03-22 DIAGNOSIS — Z0000 Encounter for general adult medical examination without abnormal findings: Secondary | ICD-10-CM | POA: Diagnosis not present

## 2020-03-22 DIAGNOSIS — Z1211 Encounter for screening for malignant neoplasm of colon: Secondary | ICD-10-CM
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
## 2020-03-22 NOTE — Patient Instructions (Addendum)
Preventive Care 72 Years and Older, Female Preventive care refers to lifestyle choices and visits with your health care provider that can promote health and wellness. This includes:  A yearly physical exam. This is also called an annual well check.  Regular dental and eye exams.  Immunizations.  Screening for certain conditions.  Healthy lifestyle choices, such as diet and exercise. What can I expect for my preventive care visit? Physical exam Your health care provider will check:  Height and weight. These may be used to calculate body mass index (BMI), which is a measurement that tells if you are at a healthy weight.  Heart rate and blood pressure.  Your skin for abnormal spots. Counseling Your health care provider may ask you questions about:  Alcohol, tobacco, and drug use.  Emotional well-being.  Home and relationship well-being.  Sexual activity.  Eating habits.  History of falls.  Memory and ability to understand (cognition).  Work and work Statistician.  Pregnancy and menstrual history. What immunizations do I need?  Influenza (flu) vaccine  This is recommended every year. Tetanus, diphtheria, and pertussis (Tdap) vaccine  You may need a Td booster every 10 years. Varicella (chickenpox) vaccine  You may need this vaccine if you have not already been vaccinated. Zoster (shingles) vaccine  You may need this after age 72. Pneumococcal conjugate (PCV13) vaccine  One dose is recommended after age 72. Pneumococcal polysaccharide (PPSV23) vaccine  One dose is recommended after age 72. Measles, mumps, and rubella (MMR) vaccine  You may need at least one dose of MMR if you were born in 1957 or later. You may also need a second dose. Meningococcal conjugate (MenACWY) vaccine  You may need this if you have certain conditions. Hepatitis A vaccine  You may need this if you have certain conditions or if you travel or work in places where you may be exposed  to hepatitis A. Hepatitis B vaccine  You may need this if you have certain conditions or if you travel or work in places where you may be exposed to hepatitis B. Haemophilus influenzae type b (Hib) vaccine  You may need this if you have certain conditions. You may receive vaccines as individual doses or as more than one vaccine together in one shot (combination vaccines). Talk with your health care provider about the risks and benefits of combination vaccines. What tests do I need? Blood tests  Lipid and cholesterol levels. These may be checked every 5 years, or more frequently depending on your overall health.  Hepatitis C test.  Hepatitis B test. Screening  Lung cancer screening. You may have this screening every year starting at age 72 if you have a 30-pack-year history of smoking and currently smoke or have quit within the past 15 years.  Colorectal cancer screening. All adults should have this screening starting at age 72 and continuing until age 15. Your health care provider may recommend screening at age 72 if you are at increased risk. You will have tests every 1-10 years, depending on your results and the type of screening test.  Diabetes screening. This is done by checking your blood sugar (glucose) after you have not eaten for a while (fasting). You may have this done every 1-3 years.  Mammogram. This may be done every 1-2 years. Talk with your health care provider about how often you should have regular mammograms.  BRCA-related cancer screening. This may be done if you have a family history of breast, ovarian, tubal, or peritoneal cancers.  Other tests  Sexually transmitted disease (STD) testing.  Bone density scan. This is done to screen for osteoporosis. You may have this done starting at age 72. Follow these instructions at home: Eating and drinking  Eat a diet that includes fresh fruits and vegetables, whole grains, lean protein, and low-fat dairy products. Limit  your intake of foods with high amounts of sugar, saturated fats, and salt.  Take vitamin and mineral supplements as recommended by your health care provider.  Do not drink alcohol if your health care provider tells you not to drink.  If you drink alcohol: ? Limit how much you have to 0-1 drink a day. ? Be aware of how much alcohol is in your drink. In the [place] one drink equals one 12 oz bottle of beer (355 mL), one 5 oz glass of wine (148 mL), or one 1 oz glass of hard liquor (44 mL). Lifestyle  Take daily care of your teeth and gums.  Stay active. Exercise for at least 30 minutes on 5 or more days each week.  Do not use any products that contain nicotine or tobacco, such as cigarettes, e-cigarettes, and chewing tobacco. If you need help quitting, ask your health care provider.  If you are sexually active, practice safe sex. Use a condom or other form of protection in order to prevent STIs (sexually transmitted infections).  Talk with your health care provider about taking a low-dose aspirin or statin. What's next?  Go to your health care provider once a year for a well check visit.  Ask your health care provider how often you should have your eyes and teeth checked.  Stay up to date on all vaccines. This information is not intended to replace advice given to you by your health care provider. Make sure you discuss any questions you have with your health care provider. Document Revised: 08/07/2018 Document Reviewed: 08/07/2018 Elsevier Patient Education  [place].   We have ordered  the Cologuard  Consider shingrix vaccine at some point this year  I will put in ENT referral.

## 2020-03-23 LAB — BASIC METABOLIC PANEL
BUN/Creatinine Ratio: 30 (calc) — ABNORMAL HIGH (ref 6–22)
BUN: 9 mg/dL (ref 7–25)
CO2: 28 mmol/L (ref 20–32)
Calcium: 9.4 mg/dL (ref 8.6–10.4)
Chloride: 101 mmol/L (ref 98–110)
Creat: 0.3 mg/dL — ABNORMAL LOW (ref 0.60–0.93)
Glucose, Bld: 83 mg/dL (ref 65–99)
Potassium: 4.3 mmol/L (ref 3.5–5.3)
Sodium: 139 mmol/L (ref 135–146)

## 2020-03-23 LAB — HEPATIC FUNCTION PANEL
AG Ratio: 1.5 (calc) (ref 1.0–2.5)
ALT: 28 U/L (ref 6–29)
AST: 21 U/L (ref 10–35)
Albumin: 4.3 g/dL (ref 3.6–5.1)
Alkaline phosphatase (APISO): 49 U/L (ref 37–153)
Bilirubin, Direct: 0.1 mg/dL (ref 0.0–0.2)
Globulin: 2.9 g/dL (calc) (ref 1.9–3.7)
Indirect Bilirubin: 0.4 mg/dL (calc) (ref 0.2–1.2)
Total Bilirubin: 0.5 mg/dL (ref 0.2–1.2)
Total Protein: 7.2 g/dL (ref 6.1–8.1)

## 2020-03-23 LAB — CBC WITH DIFFERENTIAL/PLATELET
Absolute Monocytes: 390 cells/uL (ref 200–950)
Basophils Absolute: 38 cells/uL (ref 0–200)
Basophils Relative: 0.6 %
Eosinophils Absolute: 211 cells/uL (ref 15–500)
Eosinophils Relative: 3.3 %
HCT: 39.4 % (ref 35.0–45.0)
Hemoglobin: 13.2 g/dL (ref 11.7–15.5)
Lymphs Abs: 2618 cells/uL (ref 850–3900)
MCH: 30.8 pg (ref 27.0–33.0)
MCHC: 33.5 g/dL (ref 32.0–36.0)
MCV: 92.1 fL (ref 80.0–100.0)
MPV: 11.6 fL (ref 7.5–12.5)
Monocytes Relative: 6.1 %
Neutro Abs: 3142 cells/uL (ref 1500–7800)
Neutrophils Relative %: 49.1 %
Platelets: 236 10*3/uL (ref 140–400)
RBC: 4.28 10*6/uL (ref 3.80–5.10)
RDW: 11.9 % (ref 11.0–15.0)
Total Lymphocyte: 40.9 %
WBC: 6.4 10*3/uL (ref 3.8–10.8)

## 2020-03-23 LAB — LIPID PANEL
Cholesterol: 225 mg/dL — ABNORMAL HIGH (ref <200)
HDL: 67 mg/dL (ref >=50)
LDL Cholesterol (Calc): 136 mg/dL (calc) — ABNORMAL HIGH
Non-HDL Cholesterol (Calc): 158 mg/dL (calc) — ABNORMAL HIGH (ref <130)
Total CHOL/HDL Ratio: 3.4 (calc) (ref <5.0)
Triglycerides: 108 mg/dL (ref <150)

## 2020-03-23 LAB — HEPATITIS C ANTIBODY
Hepatitis C Ab: NONREACTIVE
SIGNAL TO CUT-OFF: 0.01 (ref <1.00)

## 2020-03-23 LAB — TSH: TSH: 3.49 mIU/L (ref 0.40–4.50)

## 2020-03-25 ENCOUNTER — Encounter: Payer: Self-pay | Admitting: Family Medicine

## 2020-03-25 ENCOUNTER — Other Ambulatory Visit: Payer: Self-pay

## 2020-03-25 ENCOUNTER — Ambulatory Visit (INDEPENDENT_AMBULATORY_CARE_PROVIDER_SITE_OTHER): Payer: Medicare Other | Admitting: Family Medicine

## 2020-03-25 VITALS — BP 124/82 | Temp 97.4°F

## 2020-03-25 DIAGNOSIS — M7918 Myalgia, other site: Secondary | ICD-10-CM

## 2020-03-25 DIAGNOSIS — G8929 Other chronic pain: Secondary | ICD-10-CM

## 2020-03-25 DIAGNOSIS — M25561 Pain in right knee: Secondary | ICD-10-CM | POA: Diagnosis not present

## 2020-03-25 DIAGNOSIS — K5900 Constipation, unspecified: Secondary | ICD-10-CM

## 2020-03-25 DIAGNOSIS — M79602 Pain in left arm: Secondary | ICD-10-CM

## 2020-03-25 DIAGNOSIS — E669 Obesity, unspecified: Secondary | ICD-10-CM

## 2020-03-25 NOTE — Progress Notes (Signed)
Established Patient Office Visit  Subjective:  Patient ID: Haley Kennedy, female    DOB: [birth year]  Age: 74 y.o. MRN: [id number]  CC:  Chief Complaint  Patient presents with  . Follow-up    f/u to discuss ongpoing issues    HPI Haley Kennedy presents for multiple items as below  Right knee pain.  She states that frequently with repositioning she feels like her knee "pops out of joint ".  Apparently has she has some sense of instability of the patella.  She has also had some progressive knee pains.  She had remote history of arthroscopic surgery by report several years ago.  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  She does do some stationary exercises.  Second issue is constipation intermittently.  She has tried stool softeners in the past without much success.  We discussed colonoscopy follow-up recently her physical and she declined.  She has agreed to [employer].  No recent bloody stools.  No chronic pain medicine use.  She relates frequent left upper arm pain.  Has been sore for some time.  She has topical mentholated substance that she applies over-the-counter which seems to work.  Does not describe any shoulder joint pain.  She relates some pressure points on her left buttock.  Her husband has been looking but is not seeing any evidence for skin breakdown.  She tries to reposition frequently  She is frustrated with inability to lose weight.  She has difficulty exercising because of her Charcot-Marie-Tooth disorder.  Has not tried any recent specific dietary plans  Past Medical History:  Diagnosis Date  . Allergy   . Arthritis    low back and neck  . Asthma    as a child  . Chronic back pain   . CMT (Charcot-Marie-Tooth disease)   . Diverticulosis   . Gastric ulcer   . Hyperlipidemia   . Hyperlipidemia   . Peripheral neuropathy   . Rotator cuff (capsule) sprain 07/09/2011  . Shortness of breath    with  exertion;pt states its bc shes not in shape   . Swelling    from knee down;takes furosemide daily  . 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 Ulcer     Past Surgical History:  Procedure Laterality Date  . ABDOMINAL HYSTERECTOMY     25yrs ago  . BACK SURGERY     after back surgery had to have iron infusion  . BACK SURGERY     2009  . CHOLECYSTECTOMY     1997  . [place]  . FRACTURE SURGERY    . KNEE SURGERY    . SPINE SURGERY    . TONSILLECTOMY      Family History  Problem Relation Age of Onset  . Pneumonia Father   . Cancer Father        tonsillar cancer    Social History   Socioeconomic History  . Marital status: Married    Spouse name: Not on file  . Number of children: Not on file  . Years of education: Not on file  . Highest education level: Not on file  Occupational History  . Not on file  Tobacco Use  . Smoking status: Former Smoker    Quit date: 08/27/1978    Years since quitting: 41.6  . Smokeless tobacco: Never Used  Vaping Use  . Vaping Use: Never used  Substance and Sexual Activity  . Alcohol use: No  . Drug use: No  . Sexual activity: Never  Other Topics Concern  . Not on file  Social History Narrative  . Not on file   Social Determinants of Health   Financial Resource Strain:   . Difficulty of Paying Living Expenses:   Food Insecurity:   . Worried About Charity fundraiser in the Last Year:   . Arboriculturist in the Last Year:   Transportation Needs:   . Film/video editor (Medical):   Marland Kitchen Lack of Transportation (Non-Medical):   Physical Activity:   . Days of Exercise per Week:   . Minutes of Exercise per Session:   Stress:   . Feeling of Stress :   Social Connections:   . Frequency of Communication with Friends and Family:   . Frequency of Social Gatherings with Friends and Family:   . Attends Religious Services:   . Active Member of Clubs or Organizations:   . Attends Archivist  Meetings:   Marland Kitchen Marital Status:   Intimate Partner Violence:   . Fear of Current or Ex-Partner:   . Emotionally Abused:   Marland Kitchen Physically Abused:   . Sexually Abused:     Outpatient Medications Prior to Visit  Medication Sig Dispense Refill  . Grape Seed 60 MG CAPS Take 60 mg by mouth daily.     . Multiple Vitamins-Minerals (ANTIOXIDANT PO) Take by mouth 1 day or 1 dose.    . Omega-3 Fatty Acids (FISH OIL) 1000 MG CAPS Take by mouth. Take one capsule once daily    . cholecalciferol (VITAMIN D3) 25 MCG (1000 UT) tablet Take 1,000 Units by mouth daily.  (Patient not taking: Reported on 03/25/2020)    . Olopatadine HCl (PAZEO) 0.7 % SOLN Pazeo 0.7 % eye drops  INSTILL 1 DROP ONCE DAILY IN THE MORNING INTO EACH EYE (Patient not taking: Reported on 03/25/2020)     No facility-administered medications prior to visit.    Allergies  Allergen Reactions  . Corticosteroids     CMT Disease  . Nsaids   . Penicillins Other (See Comments)    Increased body temp  . 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 Vincristine     CMT disease  . Adhesive [Tape] Rash    plastic  . Ciprofloxacin Rash  . Phenylephrine-Guaifenesin Rash    ROS Review of Systems  Constitutional: Negative for appetite change, chills, fever and unexpected weight change.  Respiratory: Negative for cough and shortness of breath.   Cardiovascular: Negative for chest pain.  Gastrointestinal: Positive for constipation. Negative for abdominal pain and blood in stool.  Genitourinary: Negative for dysuria.  Musculoskeletal: Positive for arthralgias.      Objective:    Physical Exam Vitals reviewed.  Cardiovascular:     Rate and Rhythm: Normal rate and regular rhythm.  Pulmonary:     Effort: Pulmonary effort is normal.     Breath sounds: Normal breath sounds.  Musculoskeletal:     Comments: Right knee reveals no effusion.  She has good range of motion.  No localized tenderness.   No warmth or erythema.  Neurological:     Mental Status: She is alert.     BP 124/82 (BP Location: Left Arm, Patient Position: Sitting, Cuff Size: Normal)   Temp (!) 97.4 F (36.3 C) (Oral)  Wt Readings from Last 3 Encounters:  10/15/12 230  lb (104.3 kg)  02/25/12 210 lb (95.3 kg)  07/11/11 210 lb 8.6 oz (95.5 kg)     Health Maintenance Due  Topic Date Due  . COLONOSCOPY  02/25/2015    There are no preventive care reminders to display for this patient.  Lab Results  Component Value Date   TSH 3.49 03/22/2020   Lab Results  Component Value Date   WBC 6.4 03/22/2020   HGB 13.2 03/22/2020   HCT 39.4 03/22/2020   MCV 92.1 03/22/2020   PLT 236 03/22/2020   Lab Results  Component Value Date   NA 139 03/22/2020   K 4.3 03/22/2020   CO2 28 03/22/2020   GLUCOSE 83 03/22/2020   BUN 9 03/22/2020   CREATININE 0.30 (L) 03/22/2020   BILITOT 0.5 03/22/2020   ALKPHOS 48 02/25/2018   AST 21 03/22/2020   ALT 28 03/22/2020   PROT 7.2 03/22/2020   ALBUMIN 4.2 02/25/2018   CALCIUM 9.4 03/22/2020   GFR 233.88 02/25/2018   Lab Results  Component Value Date   CHOL 225 (H) 03/22/2020   Lab Results  Component Value Date   HDL 67 03/22/2020   Lab Results  Component Value Date   LDLCALC 136 (H) 03/22/2020   Lab Results  Component Value Date   TRIG 108 03/22/2020   Lab Results  Component Value Date   CHOLHDL 3.4 03/22/2020   No results found for: HGBA1C    Assessment & Plan:   #1 progressive right knee pain.  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  -We discussed possible orthopedic referral  #2 constipation.  Likely exacerbated by her inactivity  -Continue at least 25 g fiber per day along with plenty of fluids -She will try some MiraLAX over-the-counter  #3 persistent left upper arm pain.  Consistent really response to topical mentholated cream.  Suspect muscular  -Continue current treatment  #4 obesity.  Exacerbated by decreased ambulation  -We  discussed possible referral to [facility] management clinic at [facility] and she will consider   No orders of the defined types were placed in this encounter.   Follow-up: No follow-ups on file.    [name], MD

## 2020-04-04 ENCOUNTER — Telehealth: Payer: Self-pay | Admitting: Family Medicine

## 2020-04-04 NOTE — Telephone Encounter (Signed)
Spoke with patient and she declined and do not want anymore calls

## 2020-04-05 DIAGNOSIS — Z1211 Encounter for screening for malignant neoplasm of colon: Secondary | ICD-10-CM | POA: Diagnosis not present

## 2020-04-11 LAB — COLOGUARD
COLOGUARD: NEGATIVE
Cologuard: NEGATIVE

## 2020-04-11 LAB — EXTERNAL GENERIC LAB PROCEDURE: COLOGUARD: NEGATIVE

## 2020-04-21 DIAGNOSIS — R070 Pain in throat: Secondary | ICD-10-CM | POA: Diagnosis not present

## 2020-04-26 ENCOUNTER — Encounter: Payer: Self-pay | Admitting: Family Medicine

## 2020-05-17 DIAGNOSIS — L820 Inflamed seborrheic keratosis: Secondary | ICD-10-CM | POA: Diagnosis not present

## 2020-05-17 DIAGNOSIS — L821 Other seborrheic keratosis: Secondary | ICD-10-CM | POA: Diagnosis not present

## 2020-05-20 ENCOUNTER — Encounter: Payer: Self-pay | Admitting: Family Medicine

## 2020-06-17 ENCOUNTER — Other Ambulatory Visit: Payer: Self-pay

## 2020-06-17 ENCOUNTER — Encounter: Payer: Self-pay | Admitting: Family Medicine

## 2020-06-17 ENCOUNTER — Ambulatory Visit (INDEPENDENT_AMBULATORY_CARE_PROVIDER_SITE_OTHER): Payer: Medicare Other

## 2020-06-17 ENCOUNTER — Ambulatory Visit (INDEPENDENT_AMBULATORY_CARE_PROVIDER_SITE_OTHER): Payer: Medicare Other | Admitting: Family Medicine

## 2020-06-17 DIAGNOSIS — M1611 Unilateral primary osteoarthritis, right hip: Secondary | ICD-10-CM | POA: Diagnosis not present

## 2020-06-17 DIAGNOSIS — M25551 Pain in right hip: Secondary | ICD-10-CM

## 2020-06-17 NOTE — Progress Notes (Signed)
Established Patient Office Visit  Subjective:  Patient ID: Haley Kennedy, female    DOB: [birth year]  Age: 74 y.o. MRN: [id number]  CC: No chief complaint on file.   HPI Jovonna Nickell Dinkel presents for right "hip "pain for about 2 months.  Location is actually more iliac crest region.  She denies any fall.  Her pain has been relatively continuous.  Does not have any groin or anterior hip pain.  She has had multiple back surgeries with total of 3 lumbosacral surgeries most recently back to 2009.  She wondered if her pain may be somehow related to that does not really have any lower lumbar pain.  She has history of Charcot-Marie-Tooth disease with peripheral neuropathy.  She has been in a powered wheelchair for years and has had tremendous difficulties getting this approved for replacement.  She had recently sent in a form basically for [place] to approve for her to have PT OT assessment from [facility] to document needs for power wheelchair.  This was signed yesterday.  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   Ulcer     Past Surgical History:  Procedure Laterality Date   ABDOMINAL HYSTERECTOMY     57yrs ago   BACK SURGERY     after back surgery had to have iron infusion   BACK SURGERY     2009   [place]      Family History  Problem Relation Age of Onset   Pneumonia Father     Cancer Father        tonsillar cancer    Social History   Socioeconomic History   Marital status: Married    Spouse name: Not on file   Number of children: Not on file   Years of education: Not on file   Highest education level: Not on file  Occupational History   Not on file  Tobacco Use   Smoking status: Former Smoker    Quit date: 08/27/1978    Years since quitting: 41.8   Smokeless tobacco: Never Used  Vaping Use   Vaping Use: Never used  Substance and Sexual Activity   Alcohol use: No   Drug use: No   Sexual activity: Never  Other Topics Concern   Not on file  Social History Narrative   Not on file   Social Determinants of Health   Financial Resource Strain:    Difficulty of Paying Living Expenses: Not on file  Food Insecurity:    Worried About Running Out of Food in the Last Year: Not on file   [employer] of Food in the Last Year: Not on file  Transportation Needs:  Lack of Transportation (Medical): Not on file   Lack of Transportation (Non-Medical): Not on file  Physical Activity:    Days of Exercise per Week: Not on file   Minutes of Exercise per Session: Not on file  Stress:    Feeling of Stress : Not on file  Social Connections:    Frequency of Communication with Friends and Family: Not on file   Frequency of Social Gatherings with Friends and Family: Not on file   Attends Religious Services: Not on file   Active Member of Clubs or Organizations: Not on file   Attends Archivist Meetings: Not on file   Marital Status: Not on file  Intimate Partner Violence:    Fear of Current or Ex-Partner: Not on file   Emotionally Abused: Not on file   Physically Abused: Not on file   Sexually Abused: Not on file    Outpatient Medications Prior to Visit  Medication Sig Dispense Refill   cholecalciferol (VITAMIN D3) 25 MCG (1000 UT) tablet Take 1,000 Units by mouth daily.  (Patient not taking: Reported on 03/25/2020)      Grape Seed 60 MG CAPS Take 60 mg by mouth daily.      Multiple Vitamins-Minerals (ANTIOXIDANT PO) Take by mouth 1 day or 1 dose.     Olopatadine HCl (PAZEO) 0.7 % SOLN Pazeo 0.7 % eye drops  INSTILL 1 DROP ONCE DAILY IN THE MORNING INTO EACH EYE (Patient not taking: Reported on 03/25/2020)     Omega-3 Fatty Acids (FISH OIL) 1000 MG CAPS Take by mouth. Take one capsule once daily     No facility-administered medications prior to visit.    Allergies  Allergen Reactions   Corticosteroids     CMT Disease   Nsaids    Penicillins Other (See Comments)    Increased body temp   Prednisone     Patient has Charcot-Marie-Tooth Disease and cannot have any steroids   Vincristine     CMT disease   Adhesive [Tape] Rash    plastic   Ciprofloxacin Rash   Phenylephrine-Guaifenesin Rash    ROS Review of Systems  Constitutional: Negative for chills and fever.  Cardiovascular: Negative for chest pain.  Gastrointestinal: Negative for abdominal pain.  Skin: Negative for rash.  Neurological: Positive for weakness.      Objective:    Physical Exam Vitals reviewed.  Constitutional:      Appearance: Normal appearance.  Cardiovascular:     Rate and Rhythm: Normal rate and regular rhythm.  Musculoskeletal:     Comments: She has fairly good range of motion with internal and external rotation of the right hip.  She does have some tenderness to palpation over the right iliac crest region.  Neurological:     Mental Status: She is alert.     There were no vitals taken for this visit. Wt Readings from Last 3 Encounters:  10/15/12 230 lb (104.3 kg)  02/25/12 210 lb (95.3 kg)  07/11/11 210 lb 8.6 oz (95.5 kg)     Health Maintenance Due  Topic Date Due   COVID-19 Vaccine (1) Never done   COLONOSCOPY  02/25/2015   INFLUENZA VACCINE  03/27/2020    There are no preventive care reminders to display for this patient.  Lab Results  Component Value Date   TSH 3.49 03/22/2020    Lab Results  Component Value Date   WBC 6.4 03/22/2020   HGB 13.2 03/22/2020   HCT 39.4 03/22/2020   MCV  92.1 03/22/2020   PLT 236 03/22/2020   Lab Results  Component Value Date   NA 139 03/22/2020   K 4.3 03/22/2020   CO2 28 03/22/2020   GLUCOSE 83 03/22/2020   BUN 9 03/22/2020   CREATININE 0.30 (L) 03/22/2020   BILITOT 0.5 03/22/2020   ALKPHOS 48 02/25/2018   AST 21 03/22/2020   ALT 28 03/22/2020   PROT 7.2 03/22/2020   ALBUMIN 4.2 02/25/2018   CALCIUM 9.4 03/22/2020   GFR 233.88 02/25/2018   Lab Results  Component Value Date   CHOL 225 (H) 03/22/2020   Lab Results  Component Value Date   HDL 67 03/22/2020   Lab Results  Component Value Date   LDLCALC 136 (H) 03/22/2020   Lab Results  Component Value Date   TRIG 108 03/22/2020   Lab Results  Component Value Date   CHOLHDL 3.4 03/22/2020   No results found for: HGBA1C    Assessment & Plan:   Right hip/pelvic pain.  Her pain seems to be predominantly centered over the right iliac crest.  2 months duration with no history of injury reported.  -Start with plain x-rays of the right hip and pelvis.  Doubt hip joint pathology  No orders of the defined types were placed in this encounter.   Follow-up: No follow-ups on file.    [name], MD

## 2020-07-13 ENCOUNTER — Other Ambulatory Visit: Payer: Self-pay

## 2020-07-13 ENCOUNTER — Ambulatory Visit (HOSPITAL_COMMUNITY): Payer: Medicare Other | Attending: Family Medicine

## 2020-07-13 ENCOUNTER — Encounter (HOSPITAL_COMMUNITY): Payer: Self-pay

## 2020-07-13 DIAGNOSIS — M6281 Muscle weakness (generalized): Secondary | ICD-10-CM | POA: Diagnosis not present

## 2020-07-13 NOTE — Therapy (Addendum)
[facility] [address] [place], [place], [address] Phone: [phone]   Fax:  [phone]  Occupational Therapy Wheelchair Evaluation  Patient Details  Name: Haley Kennedy MRN: [id number] Date of Birth: [birth year] Referring Provider (OT): [name], MD   Encounter Date: 07/13/2020   OT End of Session - 07/13/20 1340    Visit Number 1    Number of Visits 1    Authorization Type [employer] Medicare    Progress Note Due on Visit 10    OT Start Time 1300    OT Stop Time 1335    OT Time Calculation (min) 35 min    Activity Tolerance Patient tolerated treatment well    Behavior During Therapy [facility] for tasks assessed/performed           Past Medical History:  Diagnosis Date  . Allergy   . Arthritis    low back and neck  . Asthma    as a child  . Chronic back pain   . CMT (Charcot-Marie-Tooth disease)   . Diverticulosis   . Gastric ulcer   . Hyperlipidemia   . Hyperlipidemia   . Peripheral neuropathy   . Rotator cuff (capsule) sprain 07/09/2011  . Shortness of breath    with exertion;pt states its bc shes not in shape   . Swelling    from knee down;takes furosemide daily  . 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 Ulcer     Past Surgical History:  Procedure Laterality Date  . ABDOMINAL HYSTERECTOMY     33yrs ago  . BACK SURGERY     after back surgery had to have iron infusion  . BACK SURGERY     2009  . CHOLECYSTECTOMY     1997  . [place]  . FRACTURE SURGERY    . KNEE SURGERY    . SPINE SURGERY    . TONSILLECTOMY      There were no vitals filed for this visit.      [facility] OT Assessment - 07/13/20 0001      Assessment   Medical Diagnosis wheelchair evaluation    Referring Provider (OT) [name], MD      Precautions   Precautions Fall              Date: 07/13/20 Patient Name: Haley Kennedy Address: [address] [place], [place] [address] DOB:  [birth year]      Haley Kennedy was seen today in this clinic for a powered wheelchair evaluation. Haley Kennedy has a past medical history that includes Charcot-Maria-Tooth (CMT) disease, Arthritis, Asthma, Chronic back pain, Diverticulosis, Gastric ulcer, Hyperlipidemia, Peripheral neuropathy, Right RTC tear, and Essential tremor.  Her surgical history includes: Back surgery (2009), Foot surgery (1963), Knee surgery, and Right RTC repair (2012).  Haley Kennedy lives with her husband in a 1 level single story house. She completes all bathing, dressing, grooming, and simple meal prep tasks at modified independent level. [name] reports she hasn't been able to walk since she was 74 years old. She relies on her present powered wheelchair for all mobility and completion of daily tasks. Her current powered wheelchair is 72 years old and has required several replacement parts to remain in use. Regardless of the replacement parts, her chair is frequently breaking down and is unsafe for use.    In order to transfer safely, Ms. Agnes must have a level surface which requires  her wheelchair to be raised up or down. When transitioning from her wheelchair to the toilet, she elevates her wheelchair in order to hold the grab bars and stand. Due to her diagnosis of CMT disease, she must hyperextend/lock bilateral knees into extension to prevent her legs from buckling. She reports her peripheral neuropathy effects both her legs from knees down.     A FULL PHYSICAL ASSESSMENT REVEALS THE FOLLOWING    Existing Equipment:   Power wheelchair, cane, grab bars by toilet, ETB, Slideboard, B AFO's for drop foot, and manual wheelchair.  Transfers:   All functional transfers are completed at Modified Independence level. Haley Kennedy requires something to pull up on in addition to elevating her wheelchair seat as she is unable to stand up from a low surface.   Head and Neck:    AROM WFL   Trunk and Pelvis: Trunk: AROM is [facility] for needed daily tasks.     Hip Unable to achieve full A/ROM bilateral hip flexion. MMT: 3-/5 for right and left.   Knees:  Able to achieve functional bilateral knee extension although not full range. Right and left knee extension/flexion strength: 3-/5. Hip adductors: 4/5. Hip abductors: 3/5.  Feet and Ankles: bilateral foot drop with strength at 3-/5 for left and right.   Upper Extremities: BUE A/ROM shoulder, elbow, and wrist WFL. Shoulder flexion, abduction, IR/er: 4/5. Bilateral bicep extension: 3/5. Bilateral bicep flexion: 4/5. Bilateral hands and forearms show signs of atrophy due to CMT disease which results in a weak gross grasp bilaterally.   Weight Shifting Ability:  Weight shifting ability is limited and not adequate to what is needed to prevent skin breakdown.  Skin Integrity:  Haley Kennedy reports skin irritation and breakdown caused by use of slideboard when transferring from chair to ETB in shower. She states that she mainly sponge bathes due to this reason.     Cognition:  WFL  Activity Tolerance: Haley Kennedy is wheelchair bound and non-ambulatory. She spends 95% of her day in her wheelchair or lift chair.    GOALS/OBJECTIVE OF SEATING INTERVENTION:  Recommendation: Ms. Ertle would benefit from a new powered wheelchair for use in her home and in the community. Her current powered wheelchair has shown signs of extensive wear and tear and replacement parts are no longer adequate to keep it running safely. Haley Kennedy is limited by her decreased BUE strength and degenerating nerves due to CMT disease and is unable to self-propel her manual wheelchair.  A scooter would not be beneficial for [name] as she does not have adequate space in her home for required turning radius. Haley Kennedy is motivated and willing to use her power wheelchair with the appropriate seating adjustments and is physically and mentally able to operate her power wheelchair. A new powered wheelchair with an elevating seat will allow Haley Kennedy to  safely transfer out of her present chair and increase her ability to be more mobile during daily activities and her quality of life.  If you require any further information concerning Ms. Teschner's positioning, independence or mobility needs; or any further information why a lesser device will not work, please do not hesitate to contact me at [place], [place] [place] [place], [place] [address] [phone].   I have no relationship with either the supplier or manufacturer of the equipment recommended.  I concur with the findings and recommendations contained in this Letter of Medical Necessity.     For the  above diagnoses and positioning/pressure concerns, I recommend that she be provided-   G2952: Quantum Edge 3 Power Wheelchair  Patient has physical and cognitive capabilities to use the recommended power mobility device. Patient's home provides adequate access between rooms, maneuvering space, and surfaces for the operation of a power chair.  This power base is required in order to provide the patient with the medically necessary electronics and multiple power operations through the recommended hand control.  This chair is more maneuverable than comparably priced traditional power wheelchairs due to the drive wheels being directly at the center of gravity of the user.  This maneuverability is required to enable the patient to negotiate the tight turns, small spaces, and obstacles characteristic of the home environment.     E2311/E2377/E2313: Multiple Actuator Control Through Expandable Controller  These electronics are sufficiently programmable to allow patient to operate the power chair safely within the home environment. The patient requires these controls to operate the power seat functions through the joystick, thus eliminating additional switches and the need to find additional access points, as they are proficient with controlling the power chair through  the joystick. These controls will also allow for any further modifications/addition to the drive controls if the patient's condition should worsen. Expandable controls are necessary to operate 3 or more power seat functions on a power wheelchair. It is recommended that this patient's chair be equipped with power tilt, power elevating legrests, and power adjustable seat height.     W4132: Power Tilt  Patient must rest in the recumbent position several times per day and transfers between the wheelchair and bed are very difficult. The tilt mechanism decreases the need for extensive assistance required for multiple transfers and allows patient to independently position self in the chair. The patient is at high risk for skin breakdown, and requires need to independently pressure relieve. The power recline option allows patient to come to a fully supine position, allowing easier clothing changes. Power recline combined with power tilt allows for more adequate pressure relief than power  tilt alone.     E0955/ G4010 : Headrest Pad w/  Multi Axis Hardware  Patient requires the headrest to provide posterior support for head and neck while in a tilted position. Without a headrest, patient would experience strain in the neck muscles, reducing patient's capacity to tilt to relieve pressure and reposition.     U7253: Bath  A swing away joystick mount allows patient to come closer to tables for meals and other activities. It also allows patient clear access to armrests when transferring.     G6440: TruComfort 2 Skin Protection and Positioning Cushion.  The patient spends most of the day in the seated position, and is at high risk of skin breakdown. This skin protection cushion will provide appropriate pressure relief and skin protection, while also helping to stabilize the pelvis in an appropriate position.     H4742: Power Centermount Elevating Footrest  The patient must elevate  her legs throughout the day to reduce swelling. Paired with the power tilt feature, the patient can elevate feet above the heart, maximizing fluid reduction.     V9563: NF22 Batteries  A pair of NF22 batteries are required to power the wheelchair, as well as power seat functions.   Thank you for this referral,   ____________________        Ailene Ravel OTR/L, CBIS  [phone] (main) [email or web address]  Plan - 07/13/20 1341    OT Frequency One time visit    Consulted and Agree with Plan of Care Patient             Visit Diagnosis: Muscle weakness (generalized)    Problem List Patient Active Problem List   Diagnosis Date Noted  . Obesity 03/25/2020  . Cold intolerance 10/01/2016  . Essential tremor 10/14/2014  . Charcot-Marie-Tooth disease 02/25/2012  . Chronic back pain 02/25/2012  . Peripheral neuropathy 02/25/2012  . Pulmonary nodule, left 09/12/2011  . Right rotator cuff tear 07/12/2011  . Rotator cuff (capsule) sprain 07/09/2011   Ailene Ravel, OTR/L,CBIS  [phone]  07/13/2020, 1:44 PM  [facility] [address] [place], [place], [address] Phone: [phone]   Fax:  [phone]  Name: Madell Heino Barrett MRN: [id number] Date of Birth: Jun 30, 1946

## 2020-10-04 ENCOUNTER — Telehealth: Payer: Medicare Other | Admitting: Family Medicine

## 2020-10-05 ENCOUNTER — Telehealth (INDEPENDENT_AMBULATORY_CARE_PROVIDER_SITE_OTHER): Payer: Medicare Other | Admitting: Family Medicine

## 2020-10-05 ENCOUNTER — Encounter: Payer: Self-pay | Admitting: Family Medicine

## 2020-10-05 DIAGNOSIS — G600 Hereditary motor and sensory neuropathy: Secondary | ICD-10-CM

## 2020-10-05 NOTE — Progress Notes (Signed)
Patient ID: Haley Kennedy, female   DOB: 04-Jan-1946, 75 y.o.   MRN: [id number]   This visit type was conducted due to national recommendations for restrictions regarding the COVID-19 pandemic in an effort to limit this patient's exposure and mitigate transmission in our community.   Virtual Visit via Video Note  I connected with Haley Kennedy on 10/05/20 at  4:30 PM EST by a video enabled telemedicine application and verified that I am speaking with the correct person using two identifiers.  Location patient: home Location provider:work or home office Persons participating in the virtual visit: patient, provider  I discussed the limitations of evaluation and management by telemedicine and the availability of in person appointments. The patient expressed understanding and agreed to proceed.   HPI:  [name] presents today for face-to-face assessment for power wheelchair.  The primary reason for her visit today is as a mobility assessment for power wheelchair.  She had recent PT evaluation and we read through their notes and agree with PT evaluation.  Patient has history of Charcot-Marie-Tooth disease.  She has associated peripheral neuropathy.  She had been basically mostly wheelchair-bound for several years now and has had a power wheelchair for several years.  She uses her power wheelchair to help assist with multiple home ADLs such as cooking, cleaning, toileting.  She also has difficulty with transfers without using her wheelchair.  She also uses a wheelchair to help transport her to her Haley Kennedy.  She does some driving.  She has excellent mental capacity to operate a power wheelchair safely in her home environment.  They have adapted their home for use of the power wheelchair.   Not having a power wheelchair would create a severe hardship for her in terms of caring out multiple activities of daily living.  Patient's height is 5 feet 6 inches and weight approximately 210 pounds.   ROS: See  pertinent positives and negatives per HPI.  Past Medical History:  Diagnosis Date  . Allergy   . Arthritis    low back and neck  . Asthma    as a child  . Chronic back pain   . CMT (Charcot-Marie-Tooth disease)   . Diverticulosis   . Gastric ulcer   . Hyperlipidemia   . Hyperlipidemia   . Peripheral neuropathy   . Rotator cuff (capsule) sprain 07/09/2011  . Shortness of breath    with exertion;pt states its bc shes not in shape   . Swelling    from knee down;takes furosemide daily  . 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 Ulcer     Past Surgical History:  Procedure Laterality Date  . ABDOMINAL HYSTERECTOMY     78yr ago  . BACK SURGERY     after back surgery had to have iron infusion  . BACK SURGERY     2009  . CHOLECYSTECTOMY     1997  . [place] . FRACTURE SURGERY    . KNEE SURGERY    . SPINE SURGERY    . TONSILLECTOMY      Family History  Problem Relation Age of Onset  . Pneumonia Father   . Cancer Father        tonsillar cancer    SOCIAL HX: Quit smoking 1980   Current Outpatient Medications:  .  cholecalciferol (VITAMIN D3) 25 MCG (1000 UT) tablet, Take 1,000 Units by mouth daily.  (Patient not taking: Reported on 07/13/2020),  Disp: , Rfl:  .  Grape Seed 60 MG CAPS, Take 60 mg by mouth daily. , Disp: , Rfl:  .  Multiple Vitamins-Minerals (ANTIOXIDANT PO), Take by mouth 1 day or 1 dose., Disp: , Rfl:  .  Olopatadine HCl (PAZEO) 0.7 % SOLN, Pazeo 0.7 % eye drops  INSTILL 1 DROP ONCE DAILY IN THE MORNING INTO EACH EYE (Patient not taking: Reported on 07/13/2020), Disp: , Rfl:  .  Omega-3 Fatty Acids (FISH OIL) 1000 MG CAPS, Take by mouth. Take one capsule once daily, Disp: , Rfl:   EXAM:  VITALS per patient if applicable:  GENERAL: alert, oriented, appears well and in no acute distress  HEENT: atraumatic, conjunttiva clear, no obvious abnormalities on inspection of external nose and ears  NECK: normal movements  of the head and neck  LUNGS: on inspection no signs of respiratory distress, breathing rate appears normal, no obvious gross SOB, gasping or wheezing  CV: no obvious cyanosis  MS: moves all visible extremities without noticeable abnormality  PSYCH/NEURO: pleasant and cooperative, no obvious depression or anxiety, speech and thought processing grossly intact  ASSESSMENT AND PLAN:  Discussed the following assessment and plan:  Charcot-Marie-Tooth disease.  She has fairly advanced peripheral neuropathy.  She has definite need for power wheelchair and these needs cannot be met by other means of transportation such as walker, manual wheelchair, or cane. She had recent detailed evaluation by physical therapy and we have read the PT notes and concur with their evaluation. Patient has good mental capacity to safely operate a power wheelchair and willingness to use this in their home.  -Written order supplied for power wheelchair and appropriate form signed       I discussed the assessment and treatment plan with the patient. The patient was provided an opportunity to ask questions and all were answered. The patient agreed with the plan and demonstrated an understanding of the instructions.   The patient was advised to call back or seek an in-person evaluation if the symptoms worsen or if the condition fails to improve as anticipated.     [name], MD

## 2020-12-26 DIAGNOSIS — H10013 Acute follicular conjunctivitis, bilateral: Secondary | ICD-10-CM | POA: Diagnosis not present

## 2021-01-11 ENCOUNTER — Telehealth: Payer: Self-pay | Admitting: Family Medicine

## 2021-01-11 MED ORDER — DIAZEPAM 5 MG PO TABS: 5.0000 mg | ORAL_TABLET | Freq: Two times a day (BID) | ORAL | 0 refills | Status: DC | PRN

## 2021-01-11 NOTE — Telephone Encounter (Signed)
I can send in a couple.   My main concern would be increased risk of falls with ambulation (on [place]) but she does not ambulate so I feel this is relatively safe.

## 2021-01-11 NOTE — Telephone Encounter (Signed)
Patient is calling and stated that she is experiencing some anxiety because she has to attend a wedding and wanted to see if the provider could sent in a couple of pills for diazepam, please advise. Pt declined appointment. CB is [facility] [address] [place], [place] [address]  Phone:  [phone] Fax:  [phone]

## 2021-01-19 ENCOUNTER — Encounter: Payer: Self-pay | Admitting: Family Medicine

## 2021-01-25 DIAGNOSIS — H01132 Eczematous dermatitis of right lower eyelid: Secondary | ICD-10-CM | POA: Diagnosis not present

## 2021-01-25 DIAGNOSIS — H01134 Eczematous dermatitis of left upper eyelid: Secondary | ICD-10-CM | POA: Diagnosis not present

## 2021-01-25 DIAGNOSIS — H01131 Eczematous dermatitis of right upper eyelid: Secondary | ICD-10-CM | POA: Diagnosis not present

## 2021-02-03 ENCOUNTER — Other Ambulatory Visit: Payer: Self-pay

## 2021-02-03 ENCOUNTER — Ambulatory Visit (INDEPENDENT_AMBULATORY_CARE_PROVIDER_SITE_OTHER): Payer: Medicare Other | Admitting: Family Medicine

## 2021-02-03 ENCOUNTER — Encounter: Payer: Self-pay | Admitting: Family Medicine

## 2021-02-03 VITALS — BP 130/62 | HR 65 | Temp 97.7°F

## 2021-02-03 DIAGNOSIS — Z7409 Other reduced mobility: Secondary | ICD-10-CM | POA: Diagnosis not present

## 2021-02-03 DIAGNOSIS — G600 Hereditary motor and sensory neuropathy: Secondary | ICD-10-CM

## 2021-02-03 NOTE — Progress Notes (Signed)
Established Patient Office Visit  Subjective:  Patient ID: Haley Kennedy, female    DOB: 1946-08-20  Age: 75 y.o. MRN: [id number]  CC:  Chief Complaint  Patient presents with   medical equipment needed    HPI Haley Kennedy presents for request for appeal letter for recent denial of new electric wheelchair.  Patient has longstanding history of Charcot Lelan Pons tooth disease.  She has other medical problems include history of essential tremor, peripheral neuropathy related to her Charcot-Marie-Tooth disease.  She states she has been basically unable to ambulate since 2012.  Her current wheelchair is 75 years old.  She has replaced the motor once and is needing replacement.  She has seen occupational therapy specialist over in [place] for assessment for appropriate documentation of need.  She needs appeal letter from me in order to get approval for the new wheelchair.    In addition to her inability to ambulate she has had issues with pressure sores left buttock and left upper/posterior  thigh region.  She needs electric wheelchair to relieve pressure as she is at very high risk of developing ulcerations.  We feel that her pressure relief and power tilt alone will not meet needs for pressure relief  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   Ulcer     Past Surgical History:  Procedure Laterality Date   ABDOMINAL HYSTERECTOMY     64yrs ago   BACK SURGERY     after back surgery had to have iron infusion   BACK SURGERY      2009   [place]      Family History  Problem Relation Age of Onset   Pneumonia Father    Cancer Father        tonsillar cancer    Social History   Socioeconomic History   Marital status: Married    Spouse name: Not on file   Number of children: Not on file   Years of education: Not on file   Highest education level: Not on file  Occupational History   Not on file  Tobacco Use   Smoking status: Former    Pack years: 0.00    Types: Cigarettes    Quit date: 08/27/1978    Years since quitting: 42.4   Smokeless tobacco: Never  Vaping Use   Vaping Use: Never used  Substance and Sexual Activity   Alcohol use: No   Drug use: No   Sexual activity: Never  Other Topics Concern   Not on file  Social History Narrative   Not on file   Social Determinants of Health   Financial Resource Strain: Not on file  Food Insecurity:  Not on file  Transportation Needs: Not on file  Physical Activity: Not on file  Stress: Not on file  Social Connections: Not on file  Intimate Partner Violence: Not on file    Outpatient Medications Prior to Visit  Medication Sig Dispense Refill   cholecalciferol (VITAMIN D3) 25 MCG (1000 UT) tablet Take 1,000 Units by mouth daily.     diazepam (VALIUM) 5 MG tablet Take 1 tablet (5 mg total) by mouth every 12 (twelve) hours as needed for anxiety. 2 tablet 0   Grape Seed 60 MG CAPS Take 60 mg by mouth daily.      Multiple Vitamins-Minerals (ANTIOXIDANT PO) Take by mouth 1 day or 1 dose.     Olopatadine HCl (PAZEO) 0.7 % SOLN Pazeo 0.7 % eye drops  INSTILL 1 DROP ONCE DAILY IN THE MORNING INTO EACH EYE     Omega-3 Fatty Acids (FISH OIL) 1000 MG CAPS Take by mouth. Take one capsule once daily     No facility-administered medications prior to visit.    Allergies  Allergen Reactions   Corticosteroids     CMT Disease   Nsaids     Penicillins Other (See Comments)    Increased body temp   Prednisone     Patient has Charcot-Marie-Tooth Disease and cannot have any steroids   Vincristine     CMT disease   Adhesive [Tape] Rash    plastic   Ciprofloxacin Rash   Phenylephrine-Guaifenesin Rash    ROS Review of Systems  Constitutional:  Negative for chills and fever.  Respiratory:  Negative for cough.   Cardiovascular:  Negative for chest pain.  Neurological:  Positive for tremors and weakness. Negative for headaches.     Objective:    Physical Exam Cardiovascular:     Rate and Rhythm: Normal rate.  Pulmonary:     Effort: Pulmonary effort is normal.     Breath sounds: Normal breath sounds.  Musculoskeletal:     Right lower leg: No edema.     Left lower leg: No edema.     Comments: She has severe interosseous muscle wasting in both hands  Neurological:     Comments: She has evidence for severe ulnar nerve involvement.  She also has severe weakness lower extremities with plantarflexion, dorsiflexion, knee extension, and hip flexion bilaterally    BP 130/62 (BP Location: Left Arm, Patient Position: Sitting, Cuff Size: Normal)   Pulse 65   Temp 97.7 F (36.5 C) (Oral)   SpO2 98%  Wt Readings from Last 3 Encounters:  10/15/12 230 lb (104.3 kg)  02/25/12 210 lb (95.3 kg)  07/11/11 210 lb 8.6 oz (95.5 kg)     Health Maintenance Due  Topic Date Due   COVID-19 Vaccine (1) Never done   Zoster Vaccines- Shingrix (1 of 2) Never done   COLONOSCOPY (Pts 45-58yrs Insurance coverage will need to be confirmed)  02/25/2015    There are no preventive care reminders to display for this patient.  Lab Results  Component Value Date   TSH 3.49 03/22/2020   Lab Results  Component Value Date   WBC 6.4 03/22/2020   HGB 13.2 03/22/2020   HCT 39.4 03/22/2020   MCV 92.1 03/22/2020   PLT 236 03/22/2020   Lab Results  Component Value Date   NA 139 03/22/2020   K 4.3 03/22/2020   CO2 28 03/22/2020   GLUCOSE 83  03/22/2020   BUN 9 03/22/2020   CREATININE 0.30 (L) 03/22/2020   BILITOT  0.5 03/22/2020   ALKPHOS 48 02/25/2018   AST 21 03/22/2020   ALT 28 03/22/2020   PROT 7.2 03/22/2020   ALBUMIN 4.2 02/25/2018   CALCIUM 9.4 03/22/2020   GFR 233.88 02/25/2018   Lab Results  Component Value Date   CHOL 225 (H) 03/22/2020   Lab Results  Component Value Date   HDL 67 03/22/2020   Lab Results  Component Value Date   LDLCALC 136 (H) 03/22/2020   Lab Results  Component Value Date   TRIG 108 03/22/2020   Lab Results  Component Value Date   CHOLHDL 3.4 03/22/2020   No results found for: HGBA1C    Assessment & Plan:   Charcot-Marie-Tooth disease.  Patient has longstanding history of severe neuropathy and inability to ambulate since 2012.  Also has history of pressure points left lower extremity and cannot maintain her activities of daily living without electric wheelchair.  She is needing a replacement wheelchair as her current wheelchair is 73 years old.  -We are writing letter of appeal to her insurance company to try to help aid in getting this covered  Over 30 minutes reviewing forms, discussing her needs, and completing necessary forms and letter.    Follow-up: No follow-ups on file.    [name], MD

## 2021-02-04 ENCOUNTER — Encounter: Payer: Self-pay | Admitting: Family Medicine

## 2021-02-06 ENCOUNTER — Telehealth: Payer: Self-pay

## 2021-02-06 NOTE — Telephone Encounter (Signed)
Her last appointment the patient brought in forms for a motorized wheelchair. Dr. Elease Hashimoto has completed his portion of the form.  This has been placed up front for pick up.  Left message for patient to call back.

## 2021-02-07 NOTE — Telephone Encounter (Signed)
Spoke with the patient. She is aware her forms have been completed and are up front for pick up. Nothing further needed at this time.

## 2021-02-21 DIAGNOSIS — H02411 Mechanical ptosis of right eyelid: Secondary | ICD-10-CM | POA: Diagnosis not present

## 2021-02-21 DIAGNOSIS — H02412 Mechanical ptosis of left eyelid: Secondary | ICD-10-CM | POA: Diagnosis not present

## 2021-02-21 DIAGNOSIS — H04553 Acquired stenosis of bilateral nasolacrimal duct: Secondary | ICD-10-CM | POA: Diagnosis not present

## 2021-02-21 DIAGNOSIS — H04552 Acquired stenosis of left nasolacrimal duct: Secondary | ICD-10-CM | POA: Diagnosis not present

## 2021-02-21 DIAGNOSIS — H04551 Acquired stenosis of right nasolacrimal duct: Secondary | ICD-10-CM | POA: Diagnosis not present

## 2021-02-21 DIAGNOSIS — L209 Atopic dermatitis, unspecified: Secondary | ICD-10-CM | POA: Diagnosis not present

## 2021-02-21 DIAGNOSIS — D485 Neoplasm of uncertain behavior of skin: Secondary | ICD-10-CM | POA: Diagnosis not present

## 2021-02-21 DIAGNOSIS — H04222 Epiphora due to insufficient drainage, left lacrimal gland: Secondary | ICD-10-CM | POA: Diagnosis not present

## 2021-02-21 DIAGNOSIS — H02413 Mechanical ptosis of bilateral eyelids: Secondary | ICD-10-CM | POA: Diagnosis not present

## 2021-02-21 DIAGNOSIS — H04221 Epiphora due to insufficient drainage, right lacrimal gland: Secondary | ICD-10-CM | POA: Diagnosis not present

## 2021-02-21 DIAGNOSIS — H04223 Epiphora due to insufficient drainage, bilateral lacrimal glands: Secondary | ICD-10-CM | POA: Diagnosis not present

## 2021-02-21 DIAGNOSIS — H53483 Generalized contraction of visual field, bilateral: Secondary | ICD-10-CM | POA: Diagnosis not present

## 2021-02-22 ENCOUNTER — Telehealth: Payer: Self-pay | Admitting: Family Medicine

## 2021-02-22 NOTE — Telephone Encounter (Signed)
[employer] called to let Dr. Elease Hashimoto know that the patients wheelchair will be partially covered by insurance. If you have any question to contact Leward Quan at [phone]

## 2021-04-26 DIAGNOSIS — M6281 Muscle weakness (generalized): Secondary | ICD-10-CM | POA: Diagnosis not present

## 2021-04-26 DIAGNOSIS — R269 Unspecified abnormalities of gait and mobility: Secondary | ICD-10-CM | POA: Diagnosis not present

## 2021-04-26 DIAGNOSIS — M129 Arthropathy, unspecified: Secondary | ICD-10-CM | POA: Diagnosis not present

## 2021-05-17 ENCOUNTER — Ambulatory Visit (INDEPENDENT_AMBULATORY_CARE_PROVIDER_SITE_OTHER): Payer: Medicare Other | Admitting: Family Medicine

## 2021-05-17 ENCOUNTER — Other Ambulatory Visit: Payer: Self-pay

## 2021-05-17 ENCOUNTER — Encounter: Payer: Self-pay | Admitting: Family Medicine

## 2021-05-17 VITALS — BP 128/78 | HR 77 | Temp 98.1°F | Ht 66.0 in

## 2021-05-17 DIAGNOSIS — L02215 Cutaneous abscess of perineum: Secondary | ICD-10-CM

## 2021-05-17 MED ORDER — DOXYCYCLINE HYCLATE 100 MG PO CAPS: 100.0000 mg | ORAL_CAPSULE | Freq: Two times a day (BID) | ORAL | 0 refills | Status: DC

## 2021-05-17 NOTE — Progress Notes (Signed)
Established Patient Office Visit  Subjective:  Patient ID: Haley Kennedy, female    DOB: 1945-12-02  Age: 75 y.o. MRN: [id number]  CC:  Chief Complaint  Patient presents with   Back Pain    Patient complains of lower left back pain, x2 days, Patient tried Tylenol with little relief.    Flank Pain    Patient complains of kidney pain,    Boil    Patient complains of a boil,     HPI Haley Kennedy presents for possible abscess perineum.  This is on the left side just lateral to the labia majora.  She states this was very tender to sit and put pressure on this area and last night apparently her husband notices considerable swelling and some redness.  He used a needle and was able to unroofed the white center and they state that they are able to get out a lot of pus.  She has much less pain today.  No fevers or chills.  She has Charcot-Marie-Tooth disease and is basically nonambulatory and has great difficulty with position changes.  Does not take tub baths.  Had some recent constipation issues.  Currently not taking thing for the constipation.  She relates some occasional left flank pain.  No dysuria.  No fevers or chills.  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   Ulcer     Past Surgical History:  Procedure Laterality Date   ABDOMINAL HYSTERECTOMY     31yrs ago   BACK SURGERY     after back surgery had to have iron infusion   BACK SURGERY     2009   [place]      Family History  Problem Relation Age of Onset   Pneumonia Father    Cancer Father        tonsillar cancer    Social History   Socioeconomic History   Marital status: Married    Spouse name: Not on file   Number of children: Not on file   Years of education: Not on file   Highest education level: Not on file  Occupational History   Not on file  Tobacco Use   Smoking status: Former    Types: Cigarettes    Quit date: 08/27/1978    Years since quitting: 42.7   Smokeless tobacco: Never  Vaping Use   Vaping Use: Never used  Substance and Sexual Activity   Alcohol use: No   Drug use: No   Sexual activity: Never  Other Topics Concern   Not on file  Social History Narrative   Not on file   Social Determinants of Radio broadcast assistant  Strain: Not on file  Food Insecurity: Not on file  Transportation Needs: Not on file  Physical Activity: Not on file  Stress: Not on file  Social Connections: Not on file  Intimate Partner Violence: Not on file    Outpatient Medications Prior to Visit  Medication Sig Dispense Refill   cholecalciferol (VITAMIN D3) 25 MCG (1000 UT) tablet Take 1,000 Units by mouth daily.     diazepam (VALIUM) 5 MG tablet Take 1 tablet (5 mg total) by mouth every 12 (twelve) hours as needed for anxiety. 2 tablet 0   Grape Seed 60 MG CAPS Take 60 mg by mouth daily.      Multiple Vitamins-Minerals (ANTIOXIDANT PO) Take by mouth 1 day or 1 dose.     Olopatadine HCl (PAZEO) 0.7 % SOLN Pazeo 0.7 % eye drops  INSTILL 1 DROP ONCE DAILY IN THE MORNING INTO EACH EYE     Omega-3 Fatty Acids (FISH OIL) 1000 MG CAPS Take by mouth. Take one capsule once daily     No facility-administered medications prior to visit.    Allergies  Allergen Reactions   Corticosteroids     CMT Disease   Nsaids    Penicillins Other (See Comments)    Increased body temp   Prednisone     Patient has  Charcot-Marie-Tooth Disease and cannot have any steroids   Vincristine     CMT disease   Adhesive [Tape] Rash    plastic   Ciprofloxacin Rash   Phenylephrine-Guaifenesin Rash    ROS Review of Systems  Constitutional:  Negative for chills and fever.  Respiratory:  Negative for cough and shortness of breath.   Gastrointestinal:  Positive for constipation. Negative for abdominal pain, diarrhea, nausea and vomiting.  Genitourinary:  Negative for dysuria.     Objective:    Physical Exam Vitals reviewed.  Cardiovascular:     Rate and Rhythm: Normal rate and regular rhythm.  Pulmonary:     Effort: Pulmonary effort is normal.     Breath sounds: Normal breath sounds.  Skin:    Comments: We had great difficulty getting her position but eventually were able to look at the area in question.  Left perineum just lateral to the labia majora she has small area of induration.  Minimal erythema.  Nonfluctuant.  No cellulitis changes.  Neurological:     Mental Status: She is alert.    BP 128/78 (BP Location: Left Arm, Patient Position: Sitting, Cuff Size: Normal)   Pulse 77   Temp 98.1 F (36.7 C) (Oral)   Ht 5\' 6"  (1.676 m)   SpO2 97%   BMI 37.12 kg/m  Wt Readings from Last 3 Encounters:  10/15/12 230 lb (104.3 kg)  02/25/12 210 lb (95.3 kg)  07/11/11 210 lb 8.6 oz (95.5 kg)     Health Maintenance Due  Topic Date Due   COVID-19 Vaccine (1) Never done   Zoster Vaccines- Shingrix (1 of 2) Never done   COLONOSCOPY (Pts 45-13yrs Insurance coverage will need to be confirmed)  02/25/2015   INFLUENZA VACCINE  03/27/2021    There are no preventive care reminders to display for this patient.  Lab Results  Component Value Date   TSH 3.49 03/22/2020   Lab Results  Component Value Date   WBC 6.4 03/22/2020   HGB 13.2 03/22/2020   HCT 39.4 03/22/2020   MCV 92.1 03/22/2020   PLT 236 03/22/2020   Lab Results  Component Value Date   NA 139 03/22/2020  K 4.3 03/22/2020   CO2 28  03/22/2020   GLUCOSE 83 03/22/2020   BUN 9 03/22/2020   CREATININE 0.30 (L) 03/22/2020   BILITOT 0.5 03/22/2020   ALKPHOS 48 02/25/2018   AST 21 03/22/2020   ALT 28 03/22/2020   PROT 7.2 03/22/2020   ALBUMIN 4.2 02/25/2018   CALCIUM 9.4 03/22/2020   GFR 233.88 02/25/2018   Lab Results  Component Value Date   CHOL 225 (H) 03/22/2020   Lab Results  Component Value Date   HDL 67 03/22/2020   Lab Results  Component Value Date   LDLCALC 136 (H) 03/22/2020   Lab Results  Component Value Date   TRIG 108 03/22/2020   Lab Results  Component Value Date   CHOLHDL 3.4 03/22/2020   No results found for: HGBA1C    Assessment & Plan:   Suspect resolving abscess left perineum.  Patient gives history that this drained substantially last night after her husband used a pin and unroofed a pustular center.  She has some induration at this time but no significant fluctuance.  Only minimal erythema.  Only minimally tender to palpation.  -Recommend warm compresses several times daily.  She is unable to do sitz baths. -Follow-up for any recurrent pain, swelling, erythema   Meds ordered this encounter  Medications   doxycycline (VIBRAMYCIN) 100 MG capsule    Sig: Take 1 capsule (100 mg total) by mouth 2 (two) times daily.    Dispense:  14 capsule    Refill:  0    Follow-up: No follow-ups on file.    [name], MD

## 2021-05-18 ENCOUNTER — Telehealth: Payer: Self-pay | Admitting: Family Medicine

## 2021-05-18 NOTE — Telephone Encounter (Signed)
Please advise 

## 2021-05-18 NOTE — Telephone Encounter (Signed)
Patient was prescribed   doxycycline (VIBRAMYCIN) 100 MG capsule   yesterday and has developed a rash. Patient spoke with Pharmacist at pharmacy who advised her to stop taking it and call for a new prescription. Patient needs new prescription, but it can't upset her stomach or she will be unable to take it. Patient was sent to triage     Good callback number is [phone]   Please Advise

## 2021-05-19 ENCOUNTER — Other Ambulatory Visit: Payer: Self-pay

## 2021-05-19 MED ORDER — CEPHALEXIN 500 MG PO CAPS: 500.0000 mg | ORAL_CAPSULE | Freq: Three times a day (TID) | ORAL | 0 refills | Status: DC

## 2021-05-19 NOTE — Telephone Encounter (Signed)
Called patient, Keflex 500mg  sent to [facility] in [place].

## 2021-05-19 NOTE — Telephone Encounter (Signed)
I agree with stopping the Doxycycline. Call in Keflex 500 mg TID for 10 days

## 2021-06-12 IMAGING — US US BREAST*L* LIMITED INC AXILLA
1 series · 4 of 4 positions shown · non-contrast
Comparison: Previous exam(s).

CLINICAL DATA: Palpable lump in the right axilla.

EXAM:
DIGITAL DIAGNOSTIC BILATERAL MAMMOGRAM WITH CAD AND TOMO
ULTRASOUND BILATERAL BREAST

[Series 1: us breast*left* limited inc axilla · 0.11mm/px · 4 of 4 slices shown]
[im 1/4]
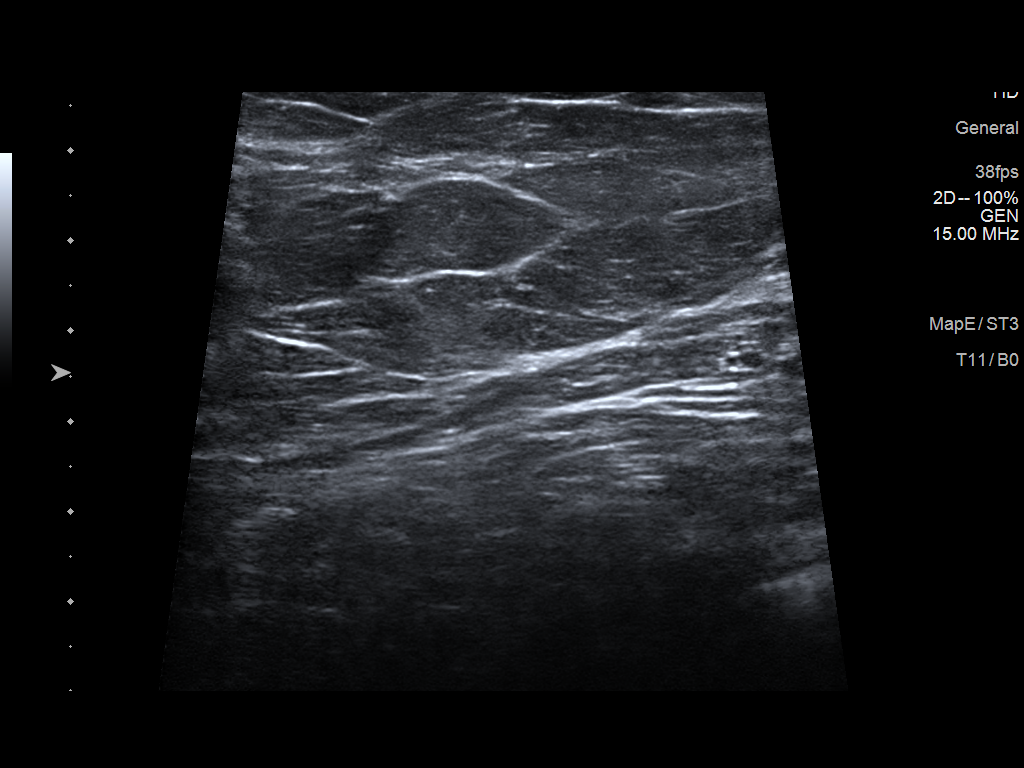
[im 2/4]
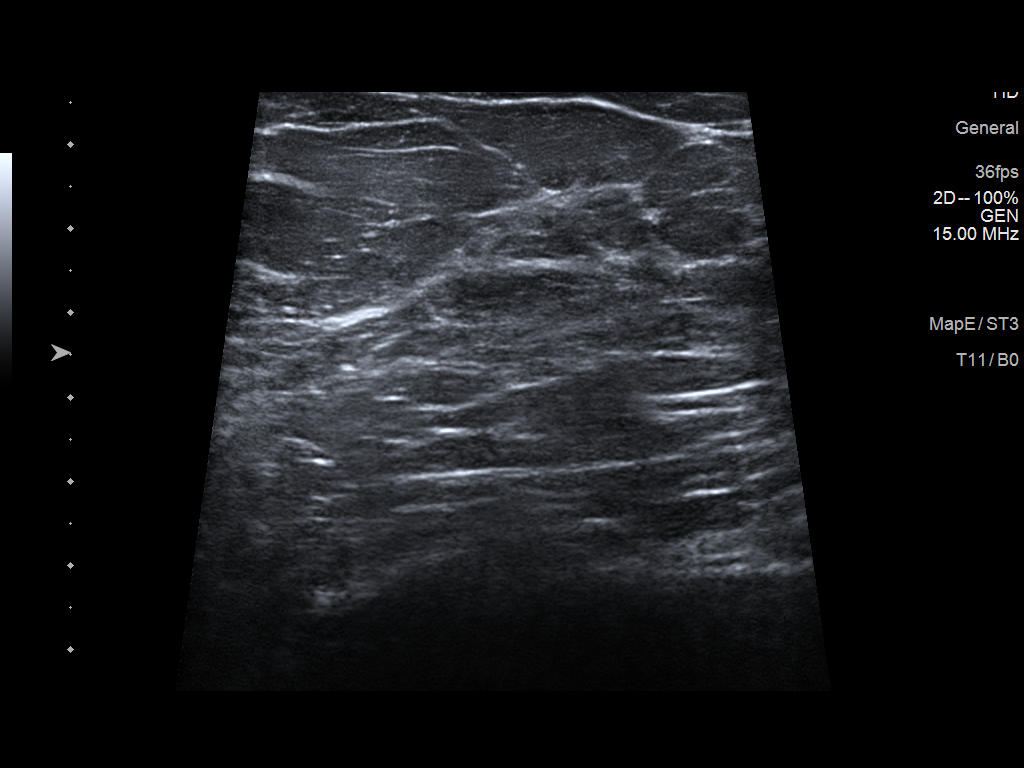
[im 3/4]
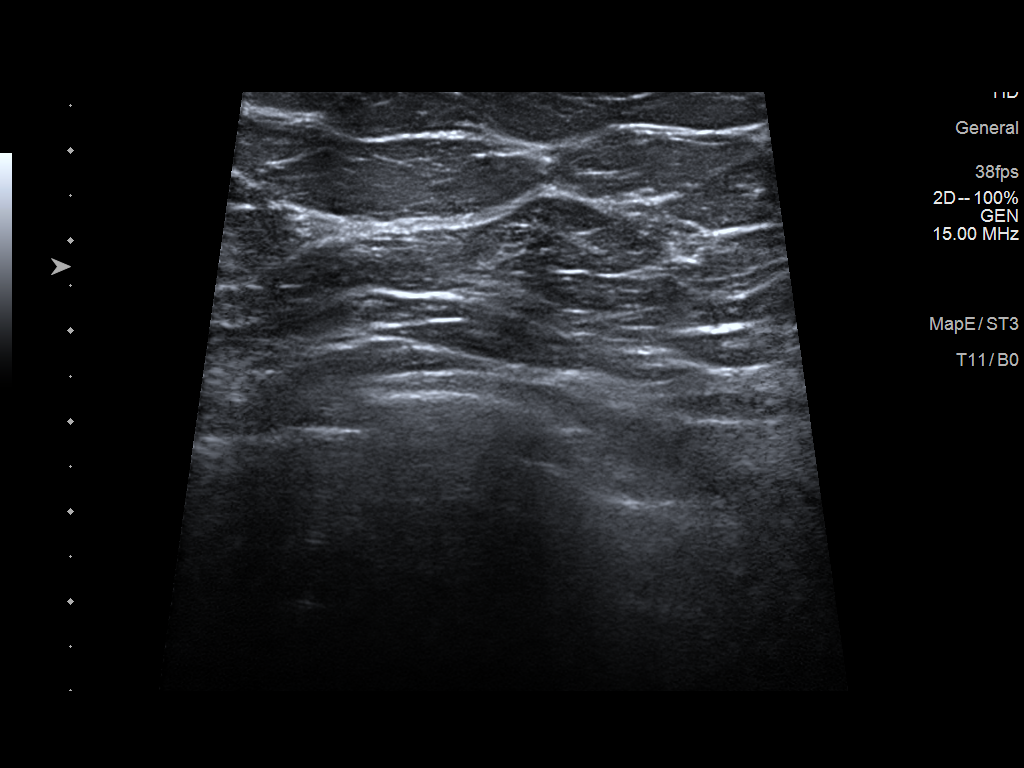
[im 4/4]
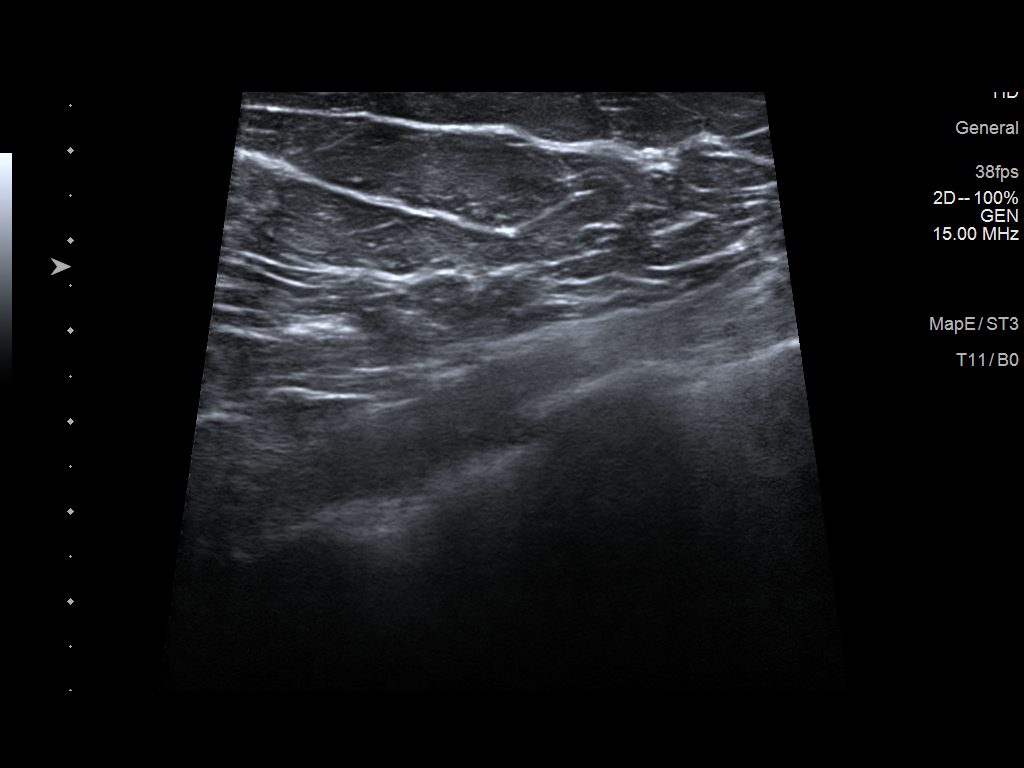

[4 of 4 positions shown; findings below may reference images not displayed]

ACR Breast Density Category b: There are scattered areas of
fibroglandular density.
FINDINGS: An asymmetry in the lateral left breast resolves on spot and rolled
imaging. No other suspicious findings are seen in either breast.

Mammographic images were processed with CAD.

On physical exam, no suspicious lumps are identified.

Targeted ultrasound is performed, showing no sonographic
abnormalities in the region of the patient's right axillary lump or
in the lateral left breast.
IMPRESSION: No mammographic or sonographic evidence of malignancy.

RECOMMENDATION:
Annual screening mammography.

I have discussed the findings and recommendations with the patient.
If applicable, a reminder letter will be sent to the patient
regarding the next appointment.

BI-RADS CATEGORY  2: Benign.

## 2021-06-12 IMAGING — MG DIGITAL DIAGNOSTIC BILAT W/ TOMO W/ CAD
8 of 16 series · 9 of 40 positions shown · non-contrast
Comparison: Previous exam(s).

CLINICAL DATA: Palpable lump in the right axilla.

EXAM:
DIGITAL DIAGNOSTIC BILATERAL MAMMOGRAM WITH CAD AND TOMO
ULTRASOUND BILATERAL BREAST

[L CC synth-2D (1 of 4)]
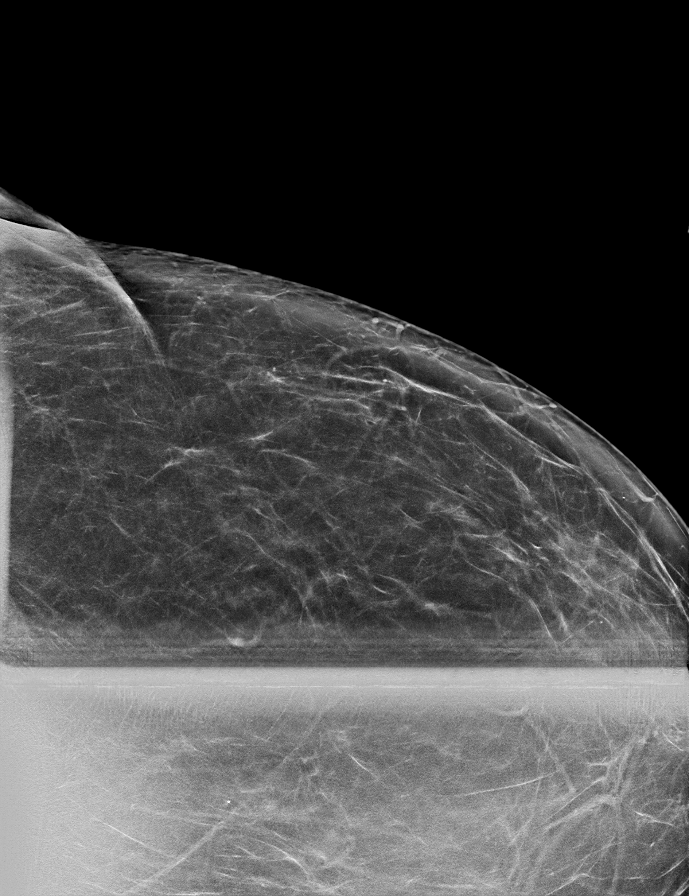

[L CC synth-2D (2 of 4)]
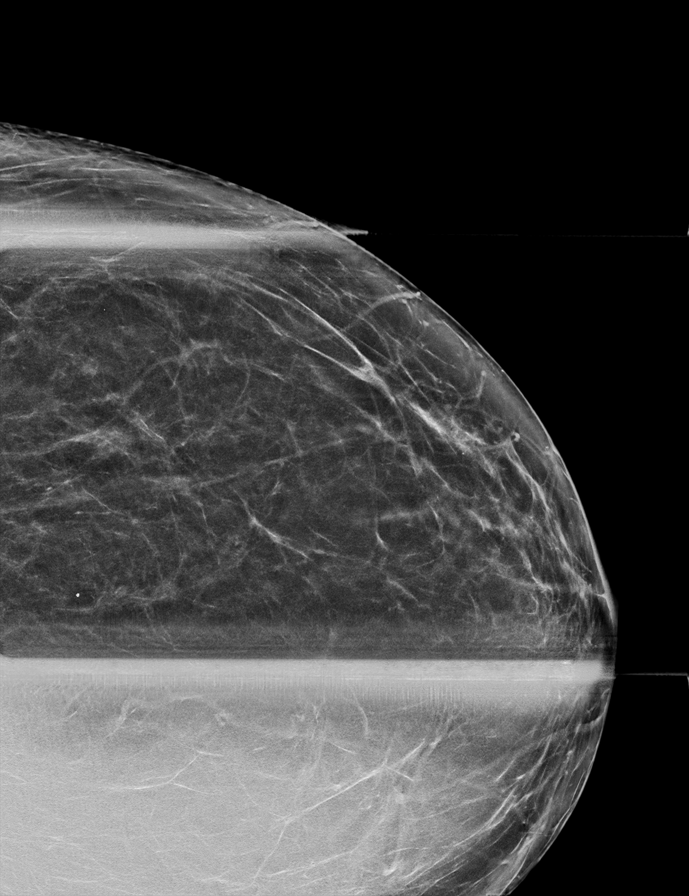

[L MLO synth-2D]
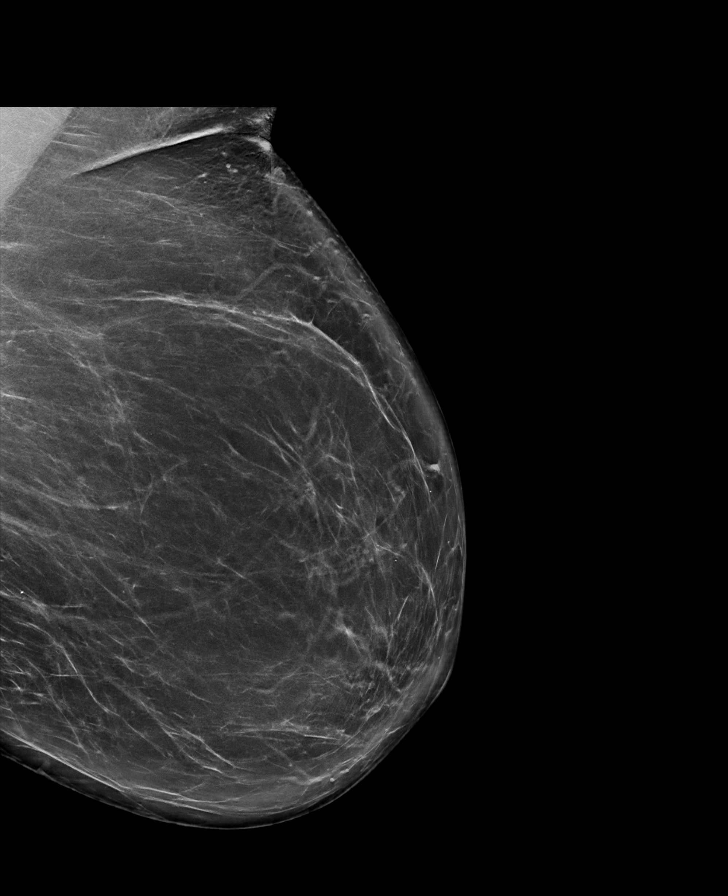

[R TAN synth-2D]
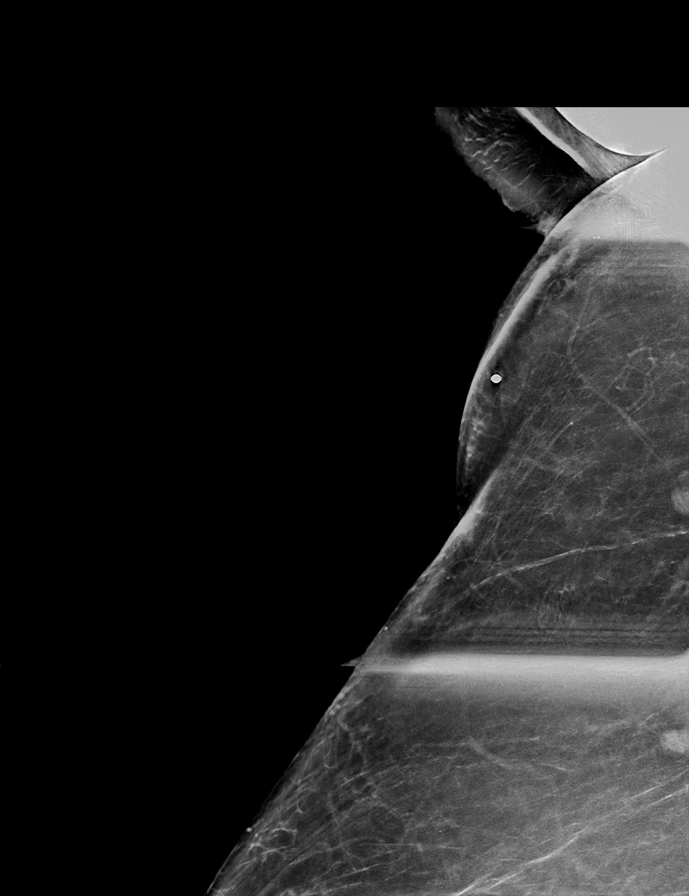

[L CC synth-2D (3 of 4)]
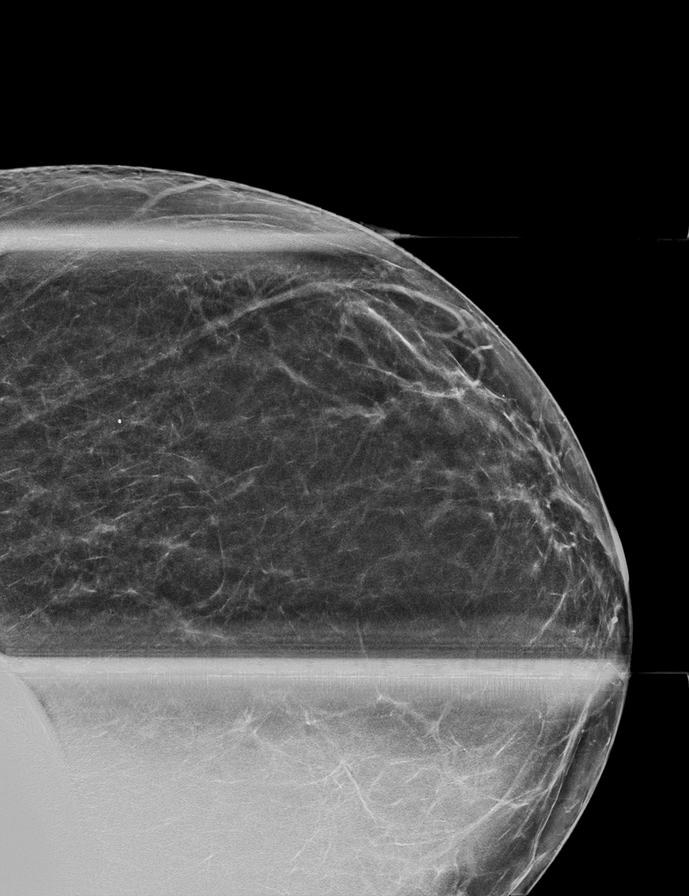

[R CC synth-2D]
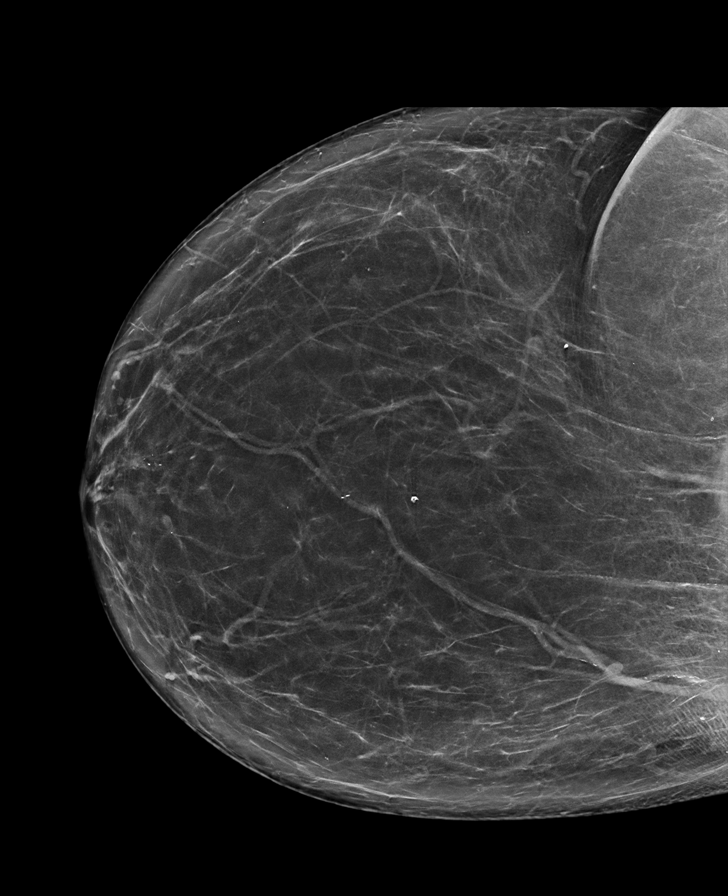

[L CC synth-2D (4 of 4)]
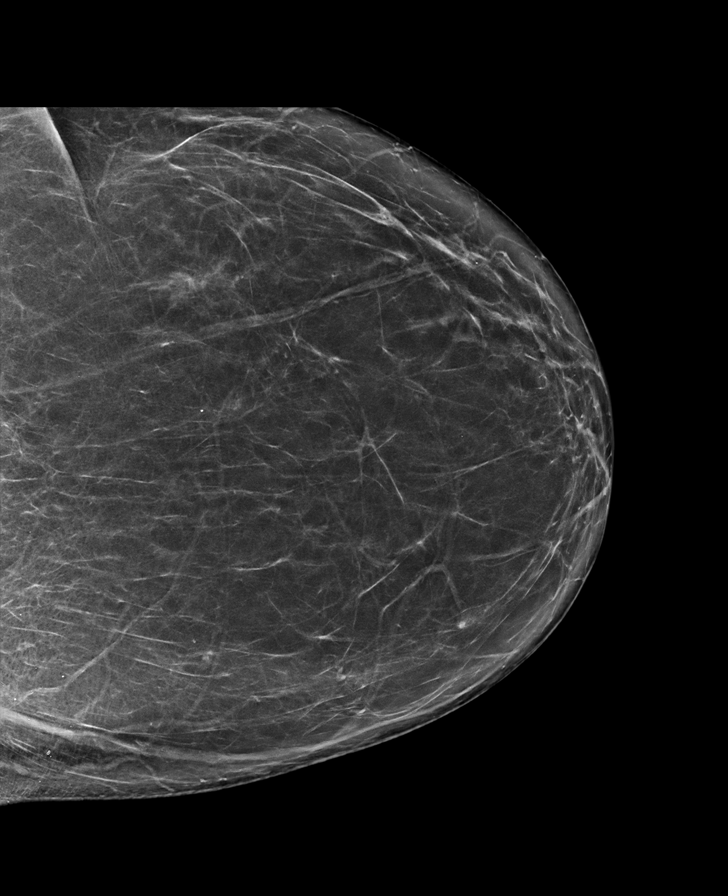

[L CC tomo · 2 of 80 frames shown]
[frame 26/80]
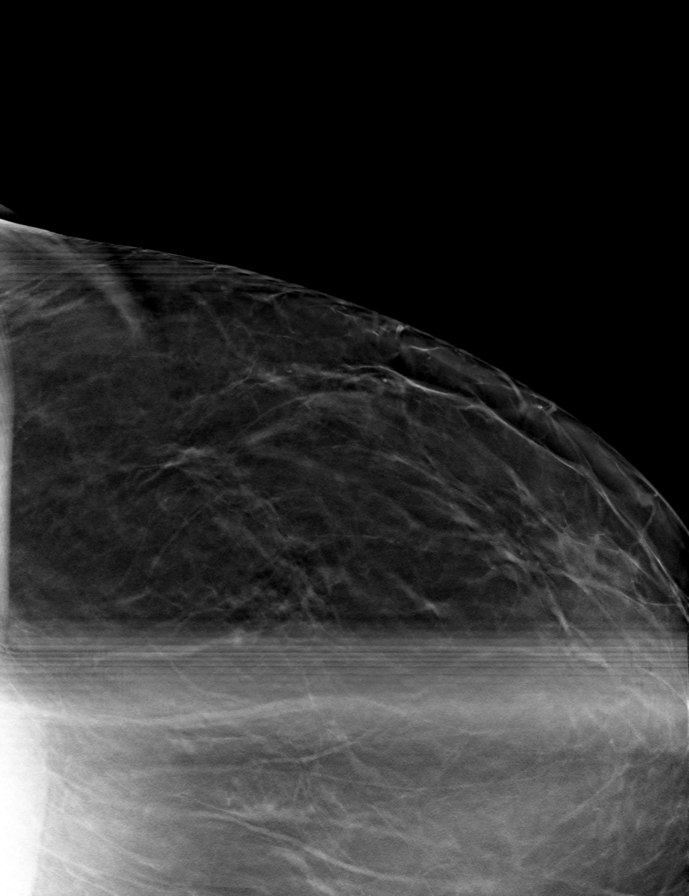
[frame 41/80]
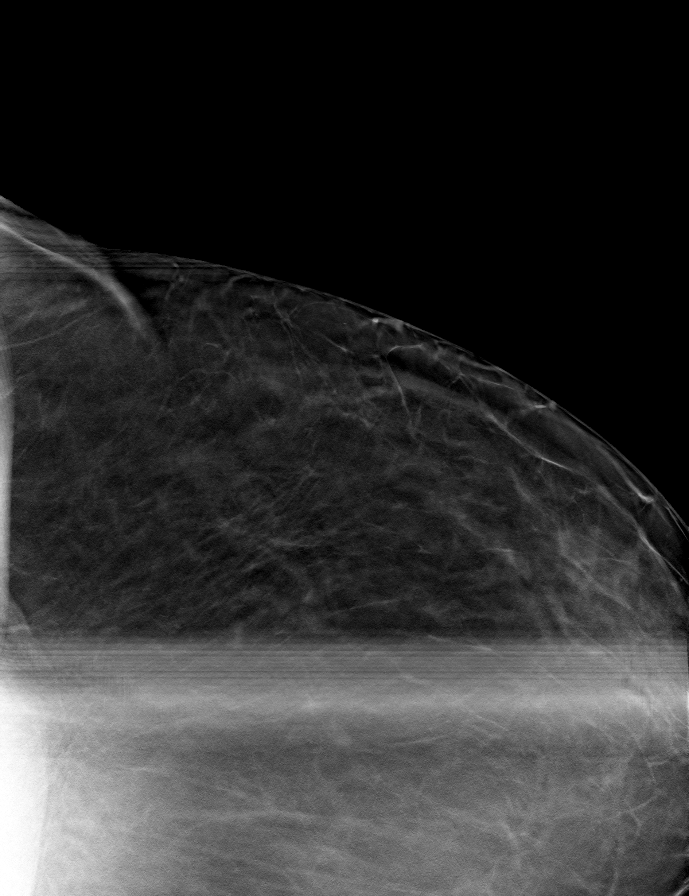

[9 of 40 positions shown; findings below may reference images not displayed]

ACR Breast Density Category b: There are scattered areas of
fibroglandular density.
FINDINGS: An asymmetry in the lateral left breast resolves on spot and rolled
imaging. No other suspicious findings are seen in either breast.

Mammographic images were processed with CAD.

On physical exam, no suspicious lumps are identified.

Targeted ultrasound is performed, showing no sonographic
abnormalities in the region of the patient's right axillary lump or
in the lateral left breast.
IMPRESSION: No mammographic or sonographic evidence of malignancy.

RECOMMENDATION:
Annual screening mammography.

I have discussed the findings and recommendations with the patient.
If applicable, a reminder letter will be sent to the patient
regarding the next appointment.

BI-RADS CATEGORY  2: Benign.

## 2021-07-12 ENCOUNTER — Ambulatory Visit (INDEPENDENT_AMBULATORY_CARE_PROVIDER_SITE_OTHER): Payer: Medicare Other | Admitting: Family Medicine

## 2021-07-12 VITALS — BP 124/70 | HR 67 | Temp 97.8°F

## 2021-07-12 DIAGNOSIS — K1370 Unspecified lesions of oral mucosa: Secondary | ICD-10-CM | POA: Diagnosis not present

## 2021-07-12 DIAGNOSIS — S161XXA Strain of muscle, fascia and tendon at neck level, initial encounter: Secondary | ICD-10-CM

## 2021-07-12 NOTE — Patient Instructions (Signed)
I will set up ENT referral  Try some heat to neck and OTC topical sports cream

## 2021-07-12 NOTE — Progress Notes (Signed)
Established Patient Office Visit  Subjective:  Patient ID: Haley Kennedy, female    DOB: 1945-11-07  Age: 75 y.o. MRN: [id number]  CC:  Chief Complaint  Patient presents with   Ear Pain    R ear, pain radiates through the back of the head. Comes and goes    HPI Haley Kennedy presents for the following items  Patient has Charcot-Marie-Tooth disease.  She is basically wheelchair-bound.  She has peripheral neuropathy.  She states that last Thursday evening she was getting up.  She has some bars that she basically lifts her self up with until she can apply some weight on her feet.  She lost her balance and thinks she may have strained her posterior neck.  She has pain up near the muscular attachment to the occiput since then worse on the right side.  Worse with movement.  Tylenol helps.  Denies headache.  No radiculitis symptoms.  She recently noticed some pinkish color tissue right right side on the posterior wall of the oropharynx.  She denies any sore throat.  No anterior cervical adenopathy.  Hx of tonsillectomy    She is currently doing a weight watchers program and has lost some weight with that.  Good appetite.  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   Ulcer     Past Surgical History:  Procedure Laterality Date   ABDOMINAL HYSTERECTOMY     66yrs ago   BACK SURGERY     after back surgery had to have iron infusion   BACK SURGERY     2009   [facility]      Family History  Problem Relation Age of Onset   Pneumonia Father    Cancer Father        tonsillar cancer    Social History   Socioeconomic History   Marital status: Married    Spouse name: Not on file   Number of children: Not on file   Years of education: Not on file   Highest education level: Not on file  Occupational History   Not on file  Tobacco Use   Smoking status: Former    Types: Cigarettes    Quit date: 08/27/1978    Years since quitting: 42.9   Smokeless tobacco: Never  Vaping Use   Vaping Use: Never used  Substance and Sexual Activity   Alcohol use: No   Drug use: No   Sexual activity: Never  Other Topics Concern   Not on file  Social History Narrative   Not on file   Social Determinants of Health   Financial Resource Strain: Not on file  Food Insecurity: Not on file  Transportation Needs: Not on file  Physical Activity: Not on file  Stress: Not on file  Social Connections: Not on file  Intimate Partner Violence: Not on file    Outpatient Medications Prior to Visit  Medication Sig Dispense Refill   cephALEXin (KEFLEX) 500 MG capsule Take 1 capsule (500 mg total) by mouth 3 (three) times daily. 30 capsule 0   cholecalciferol (VITAMIN D3) 25 MCG (1000 UT) tablet Take 1,000 Units by mouth daily.     diazepam (VALIUM) 5 MG tablet Take 1 tablet (5 mg total) by mouth every 12 (twelve) hours as needed for anxiety. 2 tablet 0   Grape Seed 60 MG CAPS Take 60 mg by mouth daily.      Multiple Vitamins-Minerals (ANTIOXIDANT PO) Take by mouth 1 day or 1 dose.     Olopatadine HCl (PAZEO) 0.7 % SOLN Pazeo 0.7 % eye drops  INSTILL 1 DROP ONCE DAILY IN THE MORNING INTO EACH EYE     Omega-3 Fatty Acids (FISH OIL) 1000 MG CAPS Take by mouth. Take one capsule once daily     No facility-administered medications prior to visit.    Allergies  Allergen Reactions   Corticosteroids     CMT Disease    Nsaids    Penicillins Other (See Comments)    Increased body temp   Prednisone     Patient has Charcot-Marie-Tooth Disease and cannot have any steroids   Vincristine     CMT disease   Adhesive [Tape] Rash    plastic   Ciprofloxacin Rash   Doxycycline Rash   Phenylephrine-Guaifenesin Rash    ROS Review of Systems  Constitutional:  Negative for chills and fever.  HENT:  Negative for sore throat and trouble swallowing.   Neurological:  Negative for weakness and headaches.  Hematological:  Negative for adenopathy.     Objective:    Physical Exam Vitals reviewed.  Constitutional:      Appearance: Normal appearance.  HENT:     Head: Normocephalic and atraumatic.     Right Ear: Tympanic membrane normal.     Left Ear: Tympanic membrane normal.     Mouth/Throat:     Comments: Posterior wall oropharynx right side she has some pinkish colored tissue.  No necrotic tissue.  No ulceration. Neck:     Comments: She does have some tenderness right posterior cervical muscles no attachment of the occiput.  No visible swelling. Cardiovascular:     Rate and Rhythm: Normal rate.  Pulmonary:     Effort: Pulmonary effort is normal.     Breath sounds: Normal breath sounds.  Musculoskeletal:     Cervical back: Neck supple.  Lymphadenopathy:     Cervical: No cervical adenopathy.  Neurological:     Mental Status: She is alert.    BP 124/70 (BP Location: Left Arm, Patient Position: Sitting, Cuff Size: Normal)   Pulse 67   Temp 97.8 F (36.6 C) (Oral)   SpO2 95%  Wt Readings from Last 3 Encounters:  10/15/12 230 lb (104.3 kg)  02/25/12 210 lb (95.3 kg)  07/11/11 210 lb 8.6 oz (95.5 kg)     Health Maintenance Due  Topic Date Due   COVID-19 Vaccine (1) Never done   Zoster Vaccines- Shingrix (1 of 2) Never done   COLONOSCOPY (Pts 45-80yrs Insurance coverage will need to be confirmed)  02/25/2015   INFLUENZA VACCINE  03/27/2021    There are no preventive care reminders to  display for this patient.  Lab  Results  Component Value Date   TSH 3.49 03/22/2020   Lab Results  Component Value Date   WBC 6.4 03/22/2020   HGB 13.2 03/22/2020   HCT 39.4 03/22/2020   MCV 92.1 03/22/2020   PLT 236 03/22/2020   Lab Results  Component Value Date   NA 139 03/22/2020   K 4.3 03/22/2020   CO2 28 03/22/2020   GLUCOSE 83 03/22/2020   BUN 9 03/22/2020   CREATININE 0.30 (L) 03/22/2020   BILITOT 0.5 03/22/2020   ALKPHOS 48 02/25/2018   AST 21 03/22/2020   ALT 28 03/22/2020   PROT 7.2 03/22/2020   ALBUMIN 4.2 02/25/2018   CALCIUM 9.4 03/22/2020   GFR 233.88 02/25/2018   Lab Results  Component Value Date   CHOL 225 (H) 03/22/2020   Lab Results  Component Value Date   HDL 67 03/22/2020   Lab Results  Component Value Date   LDLCALC 136 (H) 03/22/2020   Lab Results  Component Value Date   TRIG 108 03/22/2020   Lab Results  Component Value Date   CHOLHDL 3.4 03/22/2020   No results found for: HGBA1C    Assessment & Plan:   #1 right neck muscle strain following injury as above.  Patient is very sensitive to medications.  We recommended conservative treatment with heat and consider topical sports cream.  Consider physical therapy if not improving over the next several days  #2 patient recently noticed some abnormal looking tissue right posterior oropharynx wall. She does not have any anterior cervical adenopathy or other concerning findings.  We will set up ENT referral  No orders of the defined types were placed in this encounter.   Follow-up: No follow-ups on file.    [name], MD

## 2021-08-18 DIAGNOSIS — R221 Localized swelling, mass and lump, neck: Secondary | ICD-10-CM | POA: Diagnosis not present

## 2021-12-20 IMAGING — DX DG HIP (WITH OR WITHOUT PELVIS) 2-3V*R*
2 series · 2 of 2 positions shown · non-contrast
Comparison: None.

CLINICAL DATA: Right hip pain x2 months.

EXAM:
DG HIP (WITH OR WITHOUT PELVIS) 2-3V RIGHT

[pelvis ap]
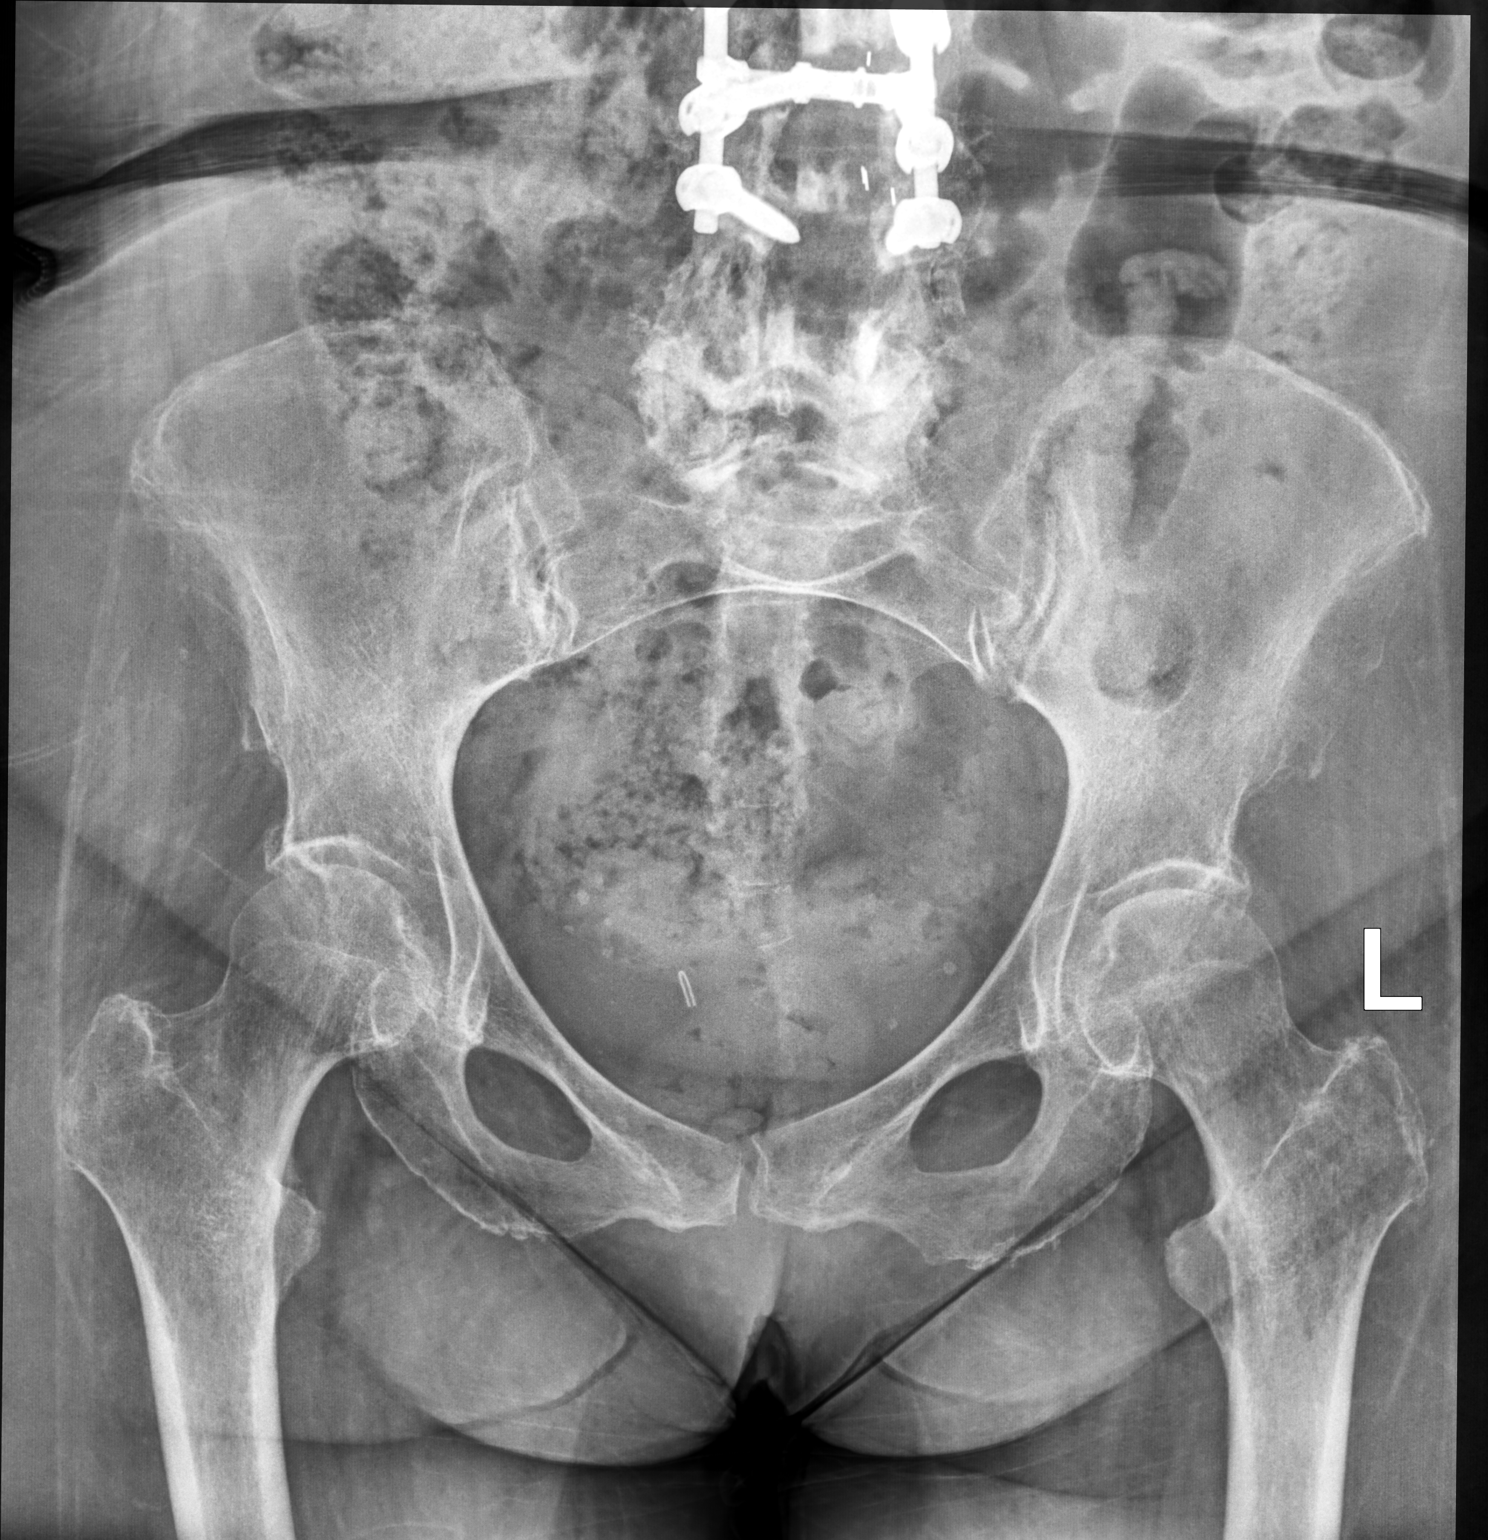

[hip (frog leg) lat]
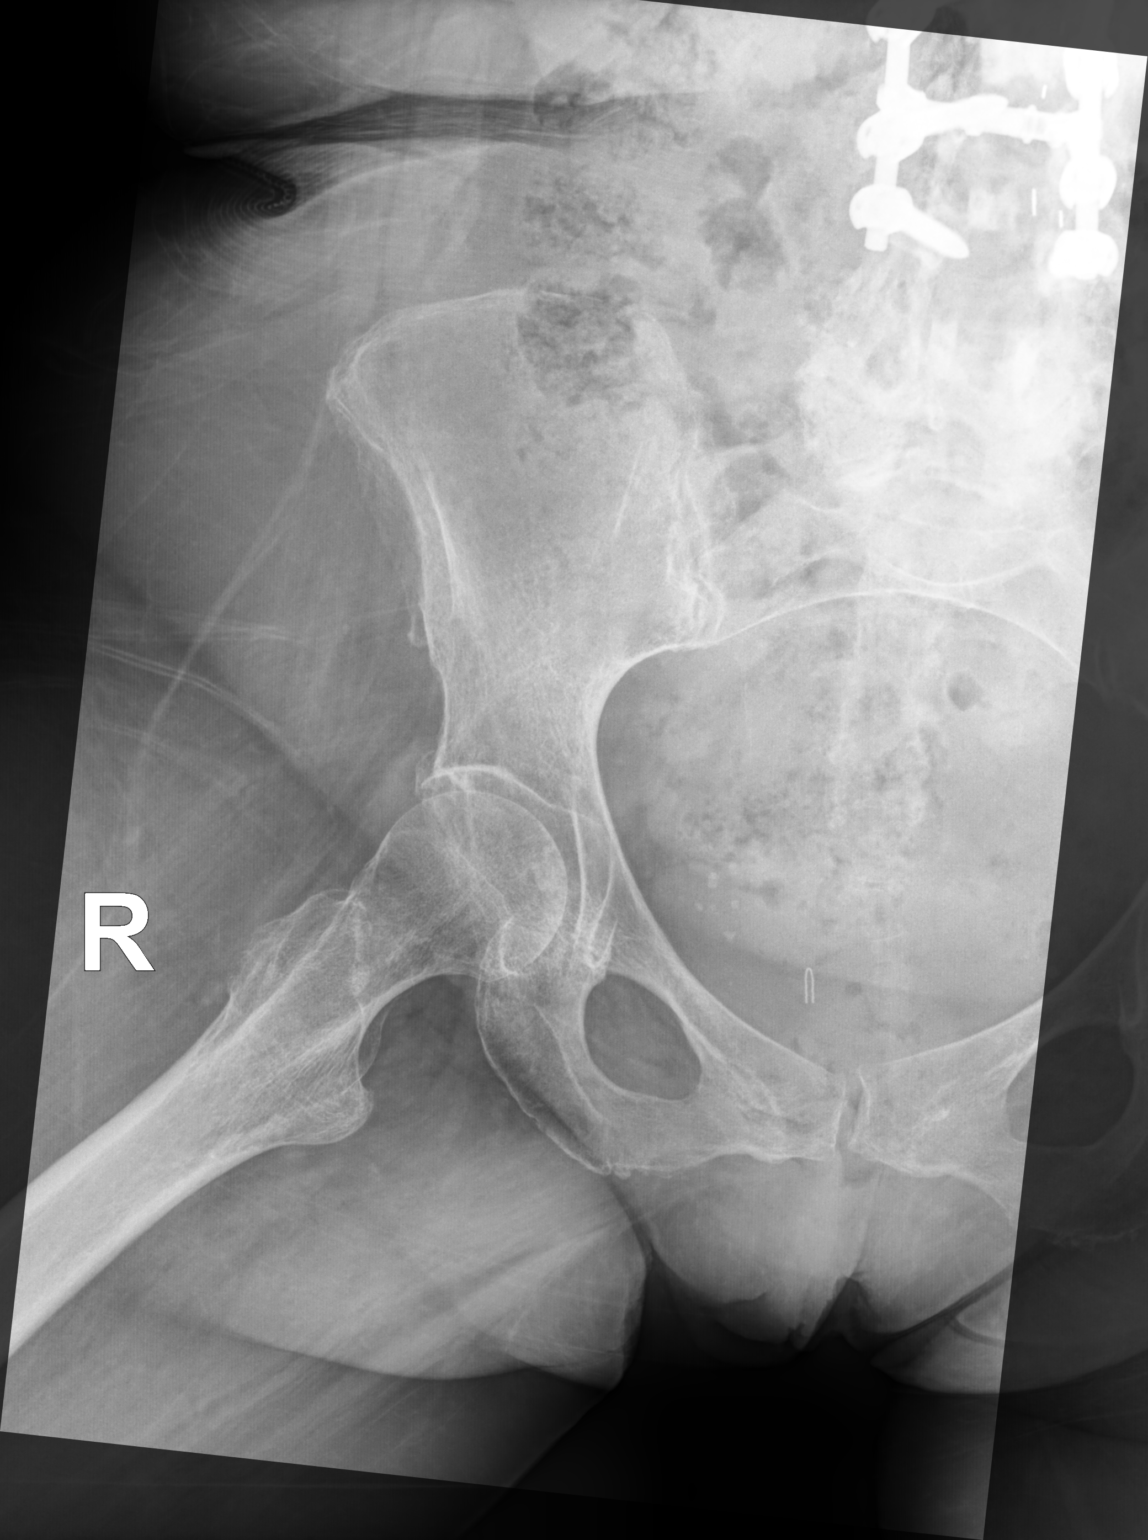

[2 of 2 positions shown; findings below may reference images not displayed]

FINDINGS: There is no evidence of an acute hip fracture or dislocation. Mild
degenerative changes are seen involving both hips. This is in the
form of joint space narrowing and acetabular sclerosis. Bilateral
radiopaque pedicle screws are seen within the visualized portion of
the lower lumbar spine.
IMPRESSION: Mild degenerative changes involving both hips.

## 2022-01-04 ENCOUNTER — Telehealth (INDEPENDENT_AMBULATORY_CARE_PROVIDER_SITE_OTHER): Payer: Medicare HMO | Admitting: Family Medicine

## 2022-01-04 DIAGNOSIS — R0981 Nasal congestion: Secondary | ICD-10-CM | POA: Diagnosis not present

## 2022-01-04 DIAGNOSIS — R059 Cough, unspecified: Secondary | ICD-10-CM | POA: Diagnosis not present

## 2022-01-04 MED ORDER — AZITHROMYCIN 250 MG PO TABS: ORAL_TABLET | ORAL | 0 refills | Status: DC

## 2022-01-04 MED ORDER — BENZONATATE 100 MG PO CAPS: ORAL_CAPSULE | ORAL | 0 refills | Status: DC

## 2022-01-04 NOTE — Patient Instructions (Signed)
-  I sent the medication(s) we discussed to your pharmacy: ?Meds ordered this encounter  ?Medications  ? azithromycin (ZITHROMAX) 250 MG tablet  ?  Sig: 2 tabs day 1, then one tab daily  ?  Dispense:  6 tablet  ?  Refill:  0  ? benzonatate (TESSALON PERLES) 100 MG capsule  ?  Sig: 1-2 capsules up to twice daily as needed for cough  ?  Dispense:  30 capsule  ?  Refill:  0  ? ? ? ?I hope you are feeling better soon! ? ?Seek in person care promptly if your symptoms worsen, new concerns arise or you are not improving with treatment. ? ?It was nice to meet you today. I help [facility] out with telemedicine visits on Tuesdays and Thursdays and am happy to help if you need a virtual follow up visit on those days. Otherwise, if you have any concerns or questions following this visit please schedule a follow up visit with your Primary Care office or seek care at a local urgent care clinic to avoid delays in care. If you are having severe or life threatening symptoms please call 911 and/or go to the nearest emergency room.  ? ?

## 2022-01-04 NOTE — Progress Notes (Signed)
Virtual Visit via Video Note ? ?I connected with Haley Kennedy ? on 01/04/22 at  1:20 PM EDT by telephone and verified that I am speaking with the correct person using two identifiers. ? Location patient: [place] ?Location provider:work or home office ?Persons participating in the virtual visit: patient, provider ? ?I discussed the limitations and requested verbal permission for telemedicine visit. The patient expressed understanding and agreed to proceed. ? ? ?HPI: ? ?Acute telemedicine visit for sinus issues and cough - requesting a zpack: ?-Onset: a little over 1 week ago ?-Symptoms include: nasal congestion, pnd, cough, reports has worsened today and has green mucus, feeling tired ?-she feels she needs an abx and reports the zpack is the only one she tolerates ?-reports covid testing was negative ?-Denies:fever, CP, SOB, NVD ?-Pertinent past medical history: see below ?-Pertinent medication allergies: ?Allergies  ?Allergen Reactions  ? Corticosteroids   ?  CMT Disease  ? Nsaids   ? Penicillins Other (See Comments)  ?  Increased body temp  ? Prednisone   ?  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 Vincristine   ?  CMT disease  ? Adhesive [Tape] Rash  ?  plastic  ? Ciprofloxacin Rash  ? Doxycycline Rash  ? Phenylephrine-Guaifenesin Rash  ?-COVID-19 vaccine status:  ?Immunization History  ?Administered Date(s) Administered  ? Fluad Quad(high Dose 65+) 07/29/2019  ? Pneumococcal Conjugate-13 03/14/2015  ? Pneumococcal Polysaccharide-23 03/21/2016  ? Tdap 10/15/2012  ? ? ? ?ROS: See pertinent positives and negatives per HPI. ? ?Past Medical History:  ?Diagnosis Date  ? Allergy   ? Arthritis   ? low back and neck  ? Asthma   ? as a child  ? Chronic back pain   ? CMT (Charcot-Marie-Tooth disease)   ? Diverticulosis   ? Gastric ulcer   ? Hyperlipidemia   ? Hyperlipidemia   ? Peripheral neuropathy   ? Rotator cuff (capsule) sprain 07/09/2011  ? Shortness of breath   ? with exertion;pt states its bc  shes not in shape   ? Swelling   ? from knee down;takes furosemide daily  ? Tremor   ? associated with CMT and takes Toprol for this  ? Ulcer   ? ? ?Past Surgical History:  ?Procedure Laterality Date  ? ABDOMINAL HYSTERECTOMY    ? 60yr ago  ? BACK SURGERY    ? after back surgery had to have iron infusion  ? BACK SURGERY    ? 2009  ? CHOLECYSTECTOMY    ? 1997  ? FOOT SURGERY    ? 1963  ? FRACTURE SURGERY    ? KNEE SURGERY    ? SPINE SURGERY    ? TONSILLECTOMY    ? ? ? ?Current Outpatient Medications:  ?  azithromycin (ZITHROMAX) 250 MG tablet, 2 tabs day 1, then one tab daily, Disp: 6 tablet, Rfl: 0 ?  benzonatate (TESSALON PERLES) 100 MG capsule, 1-2 capsules up to twice daily as needed for cough, Disp: 30 capsule, Rfl: 0 ?  cephALEXin (KEFLEX) 500 MG capsule, Take 1 capsule (500 mg total) by mouth 3 (three) times daily., Disp: 30 capsule, Rfl: 0 ?  cholecalciferol (VITAMIN D3) 25 MCG (1000 UT) tablet, Take 1,000 Units by mouth daily., Disp: , Rfl:  ?  diazepam (VALIUM) 5 MG tablet, Take 1 tablet (5 mg total) by mouth every 12 (twelve) hours as needed for anxiety., Disp: 2 tablet, Rfl: 0 ?  Grape Seed 60 MG CAPS, Take 60 mg by  mouth daily. , Disp: , Rfl:  ?  Multiple Vitamins-Minerals (ANTIOXIDANT PO), Take by mouth 1 day or 1 dose., Disp: , Rfl:  ?  Olopatadine HCl (PAZEO) 0.7 % SOLN, Pazeo 0.7 % eye drops  INSTILL 1 DROP ONCE DAILY IN THE MORNING INTO EACH EYE, Disp: , Rfl:  ?  Omega-3 Fatty Acids (FISH OIL) 1000 MG CAPS, Take by mouth. Take one capsule once daily, Disp: , Rfl:  ? ?EXAM: ? ?VITALS per patient if applicable:she denies fevers ? ?GENERAL: alert, no audible sounds of distress, sounds a little hoarse on the phone but o/w well ? ?LUNGS: no audible sounds of resp distress ? ?PSYCH/NEURO: pleasant and cooperative, no obvious depression or anxiety, speech and thought processing grossly intact ? ?ASSESSMENT AND PLAN: ? ?Discussed the following assessment and plan: ? ?Cough, unspecified type ? ?Nasal  congestion ? ?-we discussed possible serious and likely etiologies, options for evaluation and workup, limitations of telemedicine visit vs in person visit, treatment, treatment risks and precautions. Pt is agreeable to treatment via telemedicine at this moment. Query viral or bacterial resp infection. She feels she is developing a bacterial infection and is requesting an abx. Opted to try nasal saline rinse and Tessalon for cough with azithromycin if worsening or not improving over the next few days.  ? ?Advised to seek prompt virtual visit or in person care if worsening, new symptoms arise, or if is not improving with treatment as expected per our conversation of expected course. Discussed options for follow up care. Did let this patient know that I do telemedicine on Tuesdays and Thursdays for  and those are the days I am logged into the system. Advised to schedule follow up visit with PCP, [place] virtual visits or [facility] if any further questions or concerns to avoid delays in care. ?  ?I discussed the assessment and treatment plan with the patient. 13 minutes spent with this patient on the phone. The patient was provided an opportunity to ask questions and all were answered. The patient agreed with the plan and demonstrated an understanding of the instructions. ?  ? ? ?Lucretia Kern, DO  ? ?

## 2022-04-24 ENCOUNTER — Emergency Department (HOSPITAL_COMMUNITY)
Admission: EM | Admit: 2022-04-24 | Discharge: 2022-04-25 | Disposition: A | Discharge status: Home / Self Care | Payer: Medicare HMO | Attending: Emergency Medicine | Admitting: Emergency Medicine

## 2022-04-24 ENCOUNTER — Other Ambulatory Visit: Payer: Self-pay

## 2022-04-24 ENCOUNTER — Encounter (HOSPITAL_COMMUNITY): Payer: Self-pay

## 2022-04-24 DIAGNOSIS — R200 Anesthesia of skin: Secondary | ICD-10-CM | POA: Diagnosis not present

## 2022-04-24 DIAGNOSIS — Z87891 Personal history of nicotine dependence: Secondary | ICD-10-CM | POA: Diagnosis not present

## 2022-04-24 DIAGNOSIS — M545 Low back pain, unspecified: Secondary | ICD-10-CM | POA: Insufficient documentation

## 2022-04-24 DIAGNOSIS — K6289 Other specified diseases of anus and rectum: Secondary | ICD-10-CM | POA: Diagnosis not present

## 2022-04-24 DIAGNOSIS — R29818 Other symptoms and signs involving the nervous system: Secondary | ICD-10-CM | POA: Diagnosis not present

## 2022-04-24 DIAGNOSIS — M48061 Spinal stenosis, lumbar region without neurogenic claudication: Secondary | ICD-10-CM | POA: Diagnosis not present

## 2022-04-24 DIAGNOSIS — J45909 Unspecified asthma, uncomplicated: Secondary | ICD-10-CM | POA: Insufficient documentation

## 2022-04-24 DIAGNOSIS — R9431 Abnormal electrocardiogram [ECG] [EKG]: Secondary | ICD-10-CM | POA: Diagnosis not present

## 2022-04-24 DIAGNOSIS — M7918 Myalgia, other site: Secondary | ICD-10-CM | POA: Diagnosis not present

## 2022-04-24 DIAGNOSIS — Z90710 Acquired absence of both cervix and uterus: Secondary | ICD-10-CM | POA: Diagnosis not present

## 2022-04-24 DIAGNOSIS — R202 Paresthesia of skin: Secondary | ICD-10-CM | POA: Diagnosis not present

## 2022-04-24 LAB — CBC WITH DIFFERENTIAL/PLATELET
Abs Immature Granulocytes: 0.01 10*3/uL (ref 0.00–0.07)
Basophils Absolute: 0 10*3/uL (ref 0.0–0.1)
Basophils Relative: 1 %
Eosinophils Absolute: 0.1 10*3/uL (ref 0.0–0.5)
Eosinophils Relative: 3 %
HCT: 42 % (ref 36.0–46.0)
Hemoglobin: 13.9 g/dL (ref 12.0–15.0)
Immature Granulocytes: 0 %
Lymphocytes Relative: 45 %
Lymphs Abs: 2.6 10*3/uL (ref 0.7–4.0)
MCH: 30.8 pg (ref 26.0–34.0)
MCHC: 33.1 g/dL (ref 30.0–36.0)
MCV: 92.9 fL (ref 80.0–100.0)
Monocytes Absolute: 0.4 10*3/uL (ref 0.1–1.0)
Monocytes Relative: 7 %
Neutro Abs: 2.5 10*3/uL (ref 1.7–7.7)
Neutrophils Relative %: 44 %
Platelets: 229 10*3/uL (ref 150–400)
RBC: 4.52 MIL/uL (ref 3.87–5.11)
RDW: 12.5 % (ref 11.5–15.5)
WBC: 5.6 10*3/uL (ref 4.0–10.5)
nRBC: 0 % (ref 0.0–0.2)

## 2022-04-24 LAB — BASIC METABOLIC PANEL
Anion gap: 7 (ref 5–15)
BUN: 8 mg/dL (ref 8–23)
CO2: 27 mmol/L (ref 22–32)
Calcium: 9.4 mg/dL (ref 8.9–10.3)
Chloride: 105 mmol/L (ref 98–111)
Creatinine, Ser: 0.36 mg/dL — ABNORMAL LOW (ref 0.44–1.00)
GFR, Estimated: >60 mL/min (ref >60)
Glucose, Bld: 100 mg/dL — ABNORMAL HIGH (ref 70–99)
Potassium: 4.3 mmol/L (ref 3.5–5.1)
Sodium: 139 mmol/L (ref 135–145)

## 2022-04-24 NOTE — ED Triage Notes (Signed)
Reports yesterday has a pain on right side of rectum which has now gone away but when she went to bed last night noticed that she was numb posterior thigh and now has moved down left leg today.  Patient is wheelchair bound.

## 2022-04-24 NOTE — ED Provider Triage Note (Signed)
Emergency Medicine Provider Triage Evaluation Note  Sanaai Doane Magowan , a 76 y.o. female  was evaluated in triage.  Pt complains of right posterior leg tingling.  Started yesterday in her right rectum.  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  Last night she then started to have some numbness to the left posterior lower extremity.  Not ambulatory at baseline.  Review of Systems  Positive: Buttocks pain and tingling Negative: Saddle anesthesia, bowel/bladder dysfunction, fever, chills, IVDU.  Reports leg weakness at baseline  Physical Exam  BP (!) 154/88   Pulse 86   Temp (!) 97.4 F (36.3 C) (Oral)   Resp 16   Ht '5\' 6"'$  (1.676 m)   Wt 104.3 kg   SpO2 97%   BMI 37.12 kg/m  Gen:   Awake, no distress   Resp:  Normal effort  MSK:   Moves extremities without difficulty  Other:  No tenderness to palpation in the bilateral lower back.  Moving bilateral lower extremities normally.    Medical Decision Making  Medically screening exam initiated at 4:07 PM.  Appropriate orders placed.  Gwenlyn Perking Stallbaumer was informed that the remainder of the evaluation will be completed by another provider, this initial triage assessment does not replace that evaluation, and the importance of remaining in the ED until their evaluation is complete.    Nerve impingement?   Rhae Hammock, PA-C 04/24/22 1615

## 2022-04-25 ENCOUNTER — Emergency Department (HOSPITAL_COMMUNITY): Payer: Medicare HMO

## 2022-04-25 DIAGNOSIS — Z90710 Acquired absence of both cervix and uterus: Secondary | ICD-10-CM | POA: Diagnosis not present

## 2022-04-25 DIAGNOSIS — K6289 Other specified diseases of anus and rectum: Secondary | ICD-10-CM | POA: Diagnosis not present

## 2022-04-25 DIAGNOSIS — R29818 Other symptoms and signs involving the nervous system: Secondary | ICD-10-CM | POA: Diagnosis not present

## 2022-04-25 DIAGNOSIS — M48061 Spinal stenosis, lumbar region without neurogenic claudication: Secondary | ICD-10-CM | POA: Diagnosis not present

## 2022-04-25 NOTE — ED Provider Notes (Signed)
[facility] Emergency Department Provider Note MRN:  [id number]  Arrival date & time: 04/25/22     Chief Complaint   Numbness   History of Present Illness   Haley Kennedy is a 76 y.o. year-old female with a history of Charcot-Marie-Tooth presenting to the ED with chief complaint of numbness.  Patient has had about 2 days of pain to the right buttocks, adjacent to the rectum.  Husband looked at her rectum/anus and it looked normal.  Has also been experiencing some decreased sensation to the left thigh and calf, which seems to be improving over the past few hours.  History of Charcot-Marie-Tooth, started having difficulty with walking when she was in her late 92s, now uses a wheelchair and needs help standing.  Does not have any new weakness that she notices, no bowel or bladder dysfunction, no fever.  Review of Systems  A thorough review of systems was obtained and all systems are negative except as noted in the HPI and PMH.   Patient's Health History    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   Ulcer     Past Surgical History:  Procedure Laterality Date   ABDOMINAL HYSTERECTOMY     42yr ago   BACK SURGERY     after back surgery had to have iron infusion   [place]     [place]     Family History  Problem Relation Age of Onset   Pneumonia Father    Cancer Father         tonsillar cancer    Social History   Socioeconomic History   Marital status: Married    Spouse name: Not on file   Number of children: Not on file   Years of education: Not on file   Highest education level: Not on file  Occupational History   Not on file  Tobacco Use   Smoking status: Former    Types: Cigarettes    Quit date: 08/27/1978    Years since quitting: 43.6   Smokeless tobacco: Never  Vaping Use   Vaping Use: Never used  Substance and Sexual Activity   Alcohol use: No   Drug use: No   Sexual activity: Never  Other Topics Concern   Not on file  Social History Narrative   Not on file   Social Determinants of Health   Financial Resource Strain: Not on file  Food Insecurity: Not on file  Transportation Needs: Not on file  Physical Activity: Not on file  Stress: Not on file  Social Connections: Not on file  Intimate Partner Violence: Not on file  Physical Exam   Vitals:   04/24/22 2154 04/25/22 0112  BP: (!) 140/68 (!) 141/77  Pulse: 73 79  Resp: 18 16  Temp:  97.6 F (36.4 C)  SpO2: 99% 100%    CONSTITUTIONAL: Well-appearing, NAD NEURO/PSYCH:  Alert and oriented x 3, generalized weakness to the arms and legs bilaterally, symmetric sensation EYES:  eyes equal and reactive ENT/NECK:  no LAD, no JVD CARDIO: Regular rate, well-perfused, normal S1 and S2 PULM:  CTAB no wheezing or rhonchi GI/GU:  non-distended, non-tender MSK/SPINE:  No gross deformities, no edema SKIN:  no rash, atraumatic   *Additional and/or pertinent findings included in MDM below  Diagnostic and Interventional Summary    EKG Interpretation  Date/Time:  Tuesday April 24 2022 15:36:32 EDT Ventricular Rate:  79 PR Interval:  168 QRS Duration: 86 QT Interval:  382 QTC Calculation: 438 R Axis:   47 Text Interpretation: Sinus rhythm with occasional Premature ventricular complexes Otherwise normal ECG When compared with ECG of 28-Jun-2011 11:24, PREVIOUS ECG IS  PRESENT Confirmed by Gerlene Fee [phone]) on 04/24/2022 11:44:01 PM       Labs Reviewed  BASIC METABOLIC PANEL - Abnormal; Notable for the following components:      Result Value   Glucose, Bld 100 (*)    Creatinine, Ser 0.36 (*)    All other components within normal limits  CBC WITH DIFFERENTIAL/PLATELET    CT PELVIS WO CONTRAST  Final Result    CT Lumbar Spine Wo Contrast  Final Result    CT HEAD WO CONTRAST (5MM)  Final Result      Medications - No data to display   Procedures  /  Critical Care Procedures  ED Course and Medical Decision Making  Initial Impression and Ddx Back/buttocks pain and left leg numbness that is slowly improving.  Differential diagnosis includes radiculopathy, perirectal or anal abscess is felt to be unlikely.  No fever, overall her neurological function at this time is her baseline.  May have some minor subjective decreased sensation to the left quad region.  Will obtain screening CT imaging of the head, pelvis, lumbar spine and reassess.  Past medical/surgical history that increases complexity of ED encounter: Charcot-Marie-Tooth  Interpretation of Diagnostics I personally reviewed the laboratory assessment and my interpretation is as follows: No significant blood count or electrolyte disturbance.  CT imaging without acute or emergent process.  Patient Reassessment and Ultimate Disposition/Management     Patient's symptoms are resolved.  With reassuring work-up there is no indication for further testing or admission, she can follow-up with her regular doctors in the outpatient setting.  Patient management required discussion with the following services or consulting groups:  None  Complexity of Problems Addressed Acute illness or injury that poses threat of life of bodily function  Additional Data Reviewed and Analyzed Further history obtained from: Further history from spouse/family member  Additional Factors Impacting ED Encounter  Risk None  Barth Kirks. Sedonia Small, [place] [email or web address]  Final Clinical Impressions(s) / ED Diagnoses     ICD-10-CM   1. Buttock pain  M79.18     2. Leg numbness  R20.0       ED Discharge Orders     None        Discharge Instructions Discussed with and Provided to Patient:     Discharge Instructions      You were evaluated in the Emergency Department and after careful evaluation, we did not find any  emergent condition requiring admission or further testing in the hospital.  Your exam/testing today was overall reassuring.  Recommend continued follow-up with your regular doctors to discuss your symptoms.  Please return to the Emergency Department if you experience any worsening of your condition.  Thank you for allowing [place] to be a part of your care.        Maudie Flakes, MD 04/25/22 [phone]

## 2022-04-25 NOTE — Discharge Instructions (Signed)
You were evaluated in the Emergency Department and after careful evaluation, we did not find any emergent condition requiring admission or further testing in the hospital.  Your exam/testing today was overall reassuring.  Recommend continued follow-up with your regular doctors to discuss your symptoms.  Please return to the Emergency Department if you experience any worsening of your condition.  Thank you for allowing us to be a part of your care.  

## 2022-04-25 NOTE — ED Notes (Signed)
Patient verbalizes understanding of d/c instructions. Opportunities for questions and answers were provided. Pt d/c from ED and wheeled to lobby with husband. 

## 2022-05-04 ENCOUNTER — Encounter: Payer: Self-pay | Admitting: Family Medicine

## 2022-05-04 ENCOUNTER — Ambulatory Visit (INDEPENDENT_AMBULATORY_CARE_PROVIDER_SITE_OTHER): Payer: Medicare HMO | Admitting: Family Medicine

## 2022-05-04 VITALS — BP 140/86 | HR 68 | Temp 97.7°F

## 2022-05-04 DIAGNOSIS — Z0000 Encounter for general adult medical examination without abnormal findings: Secondary | ICD-10-CM | POA: Diagnosis not present

## 2022-05-04 LAB — HEPATIC FUNCTION PANEL
ALT: 26 U/L (ref 0–35)
AST: 27 U/L (ref 0–37)
Albumin: 4.1 g/dL (ref 3.5–5.2)
Alkaline Phosphatase: 42 U/L (ref 39–117)
Bilirubin, Direct: 0 mg/dL (ref 0.0–0.3)
Total Bilirubin: 0.4 mg/dL (ref 0.2–1.2)
Total Protein: 7.3 g/dL (ref 6.0–8.3)

## 2022-05-04 LAB — TSH: TSH: 3.69 u[IU]/mL (ref 0.35–5.50)

## 2022-05-04 LAB — LIPID PANEL
Cholesterol: 197 mg/dL (ref 0–200)
HDL: 63.8 mg/dL (ref >39.00)
LDL Cholesterol: 106 mg/dL — ABNORMAL HIGH (ref 0–99)
NonHDL: 133.3
Total CHOL/HDL Ratio: 3
Triglycerides: 135 mg/dL (ref 0.0–149.0)
VLDL: 27 mg/dL (ref 0.0–40.0)

## 2022-05-04 NOTE — Progress Notes (Signed)
Established Patient Office Visit  Subjective   Patient ID: Haley Kennedy, female    DOB: August 13, 1946  Age: 76 y.o. MRN: [id number]  Chief Complaint  Patient presents with   Annual Exam    HPI   Haley Kennedy is seen for annual physical exam.  Patient had recent ER visit for some buttock pain and numbness left lower extremity.  Those symptoms have resolved at this time.  She had labs including CBC and basic metabolic panel which were normal.  She had CT head, pelvis, lumbar with no acute findings.  She has intermittent hoarseness and has seen ENT for this previously.  Frequent postnasal drip symptoms.  Currently takes Benadryl which seems to help some.  Health maintenance reviewed  -Prior hepatitis C screen negative -Pneumonia vaccines complete -Tetanus due 2024 -Declines further DEXA scans -Cologuard 2021 negative -Declines shingles vaccine -Declines flu vaccine -Overdue for mammogram.  She will consider setting this up.  Family history significant for father having tonsillar cancer.  Social history-married.  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  Non-smoker.  No alcohol.  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   Ulcer    Past Surgical History:  Procedure Laterality Date   ABDOMINAL HYSTERECTOMY     55yr ago   BACK SURGERY     after back surgery had to have iron infusion   [place]     reports that she quit smoking about 43 years ago. Her smoking use included cigarettes. She has never used smokeless tobacco. She reports that she does not drink alcohol and does not use drugs. family history includes Cancer in her father; Pneumonia in her father. Allergies  Allergen Reactions   Corticosteroids     CMT Disease   Nsaids    Penicillins Other (See Comments)    Increased body temp   Prednisone     Patient has Charcot-Marie-Tooth Disease and cannot have any steroids   Vincristine     CMT disease   Adhesive [Tape] Rash    plastic   Ciprofloxacin Rash   Doxycycline Rash   Phenylephrine-Guaifenesin Rash    Review of Systems  Constitutional:  Negative for malaise/fatigue.  Eyes:  Negative for blurred vision.  Respiratory:  Negative for shortness of breath.   Cardiovascular:  Negative for chest pain.  Gastrointestinal:  Negative for abdominal pain.  Genitourinary:  Negative for dysuria.  Neurological:  Negative for dizziness, weakness and headaches.      Objective:     BP (!) 140/86 (BP Location: Left Arm, Patient Position: Sitting, Cuff Size: Normal)   Pulse 68   Temp  97.7 F (36.5 C) (Oral)   SpO2 99%    Physical Exam Vitals reviewed.  HENT:     Right Ear: Tympanic membrane normal.     Left Ear: Tympanic membrane normal.  Cardiovascular:     Rate and Rhythm: Normal rate and regular rhythm.  Pulmonary:     Effort: Pulmonary effort is normal.     Breath sounds: Normal breath sounds.  Musculoskeletal:     Right lower leg: No edema.     Left lower leg: No edema.     Comments: She has Charcot-Marie-Tooth disease with chronic upper extremity muscle atrophy  Neurological:     General: No focal deficit present.     Mental Status: She is alert.      No results found for any visits on 05/04/22.    The 10-year ASCVD risk score (Arnett DK, et al., 2019) is: 17.2%    Assessment & Plan:   Problem List Items Addressed This Visit    None Visit Diagnoses     Physical exam    -  Primary   Relevant Orders   Hepatic function panel   TSH   Lipid panel     -Flu vaccine offered and patient declines -Discussed repeat DEXA scan and she declines -Set up mammogram -No indication for further Pap smears -Consider Cologuard versus repeat colonoscopy in 1 year -Shingrix offered and declined -Obtain labs as above.  We did not get basic metabolic panel and CBC since these were done recently in the ER.  No follow-ups on file.    Haley Littler, MD

## 2022-05-04 NOTE — Patient Instructions (Signed)
Consider trial of over the counter XYZal for postnasal drip symptoms.    Set up repeat screening mammogram.

## 2022-06-26 ENCOUNTER — Ambulatory Visit (INDEPENDENT_AMBULATORY_CARE_PROVIDER_SITE_OTHER): Payer: Medicare HMO | Admitting: Family Medicine

## 2022-06-26 ENCOUNTER — Encounter: Payer: Self-pay | Admitting: Family Medicine

## 2022-06-26 VITALS — BP 136/80 | HR 66 | Temp 97.7°F | Ht 66.0 in

## 2022-06-26 DIAGNOSIS — J029 Acute pharyngitis, unspecified: Secondary | ICD-10-CM

## 2022-06-26 DIAGNOSIS — N644 Mastodynia: Secondary | ICD-10-CM | POA: Diagnosis not present

## 2022-06-26 DIAGNOSIS — F418 Other specified anxiety disorders: Secondary | ICD-10-CM | POA: Diagnosis not present

## 2022-06-26 MED ORDER — DIAZEPAM 5 MG PO TABS
ORAL_TABLET | ORAL | 0 refills | Status: DC
Start: 2022-06-26 — End: 2023-08-16

## 2022-06-26 NOTE — Patient Instructions (Signed)
We will set up diagnostic mammogram.

## 2022-06-26 NOTE — Progress Notes (Signed)
Established Patient Office Visit  Subjective   Patient ID: Haley Kennedy, female    DOB: November 25, 1945  Age: 76 y.o. MRN: [id number]  Chief Complaint  Patient presents with   Sinusitis    Patient complains of sinusitis,    Sore Throat    Patient complains of sore throat, x1 week, Tried Saline, Tylenol and Allegra   Breast Pain    HPI   Haley Kennedy is seen for the following issues  Sore throat for the past week or so intermittently.  She has tried some Tylenol and eventually tried some saline which seemed to help the most.  No fever.  Her sore throat symptoms are actually down below the posterior pharynx region.  No difficulty swallowing.  No solid food dysphagia.  May have some postnasal drainage.  She has noticed some soreness left upper lateral breast region for the past several weeks.  She has type of exercise equipment called "NuStep ".  This has arm handles for upper body exercise as well as losing her legs.  This is recumbent.  She wonders if she may be having some soreness related to that.  She has been using this regularly up to an hour per day.  Usually does at least 4000 steps.  Denies any nipple discharge.  No skin dimpling.  No axillary mass on the left.  She has history of anxiety.  She had old prescription for diazepam from 2009.  She is requesting refill of 5 mg diazepam to take 1 hour prior to procedure as she feels very anxious regarding getting mammogram.  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   Ulcer    Past  Surgical History:  Procedure Laterality Date   ABDOMINAL HYSTERECTOMY     66yr ago   BACK SURGERY     after back surgery had to have iron infusion   [place]     [address]   [address] [address]     reports that she quit smoking about 43 years ago. Her smoking use included cigarettes. She has never used smokeless tobacco. She reports that she does not drink alcohol and does not use drugs. family history includes Cancer in her father; Pneumonia in her father. 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  HENT:  Positive for sore throat.   Respiratory:  Negative for cough and shortness of breath.   Cardiovascular:  Negative for palpitations.      Objective:     BP 136/80 (BP Location: Left Arm, Patient Position: Sitting, Cuff Size: Large)   Pulse 66   Temp 97.7 F (36.5 C) (Oral)   Ht '5\' 6"'$  (1.676 m)   SpO2 99%   BMI 37.12 kg/m    Physical Exam Vitals reviewed.  Constitutional:      Appearance: She is well-developed.  HENT:     Mouth/Throat:     Comments: Had previous tonsillectomy.  No posterior pharynx erythema.  No exudate. Neck:     Thyroid: No thyromegaly.  Cardiovascular:     Rate and Rhythm: Normal rate and regular rhythm.  Pulmonary:     Effort: Pulmonary effort is normal.     Breath sounds: Normal breath sounds.  Chest:     Comments: Left breast exam and with nurse present.  No mass palpated.  Area in question was examined and we cannot appreciate any underlying  mass and no axillary adenopathy. Musculoskeletal:     Cervical back: Neck supple.  Neurological:     Mental Status: She is alert.      No results found for any visits on 06/26/22.    The 10-year ASCVD risk score (Arnett DK, et al., 2019) is: 16%    Assessment & Plan:   #1 left upper lateral breast pain.  No worrisome masses palpated.  Patient requesting diagnostic mammogram.  We will set up diagnostic mammogram with ultrasound if indicated. Suspect she probably has more likely musculoskeletal soreness possibly related to her exercise equipment -Patient requesting diazepam refill to take 5 mg 1 hour prior to procedure and this was sent  #2 sore throat symptoms.  No evidence for abnormality on exam.  Question related to postnasal drip.  Continue salt water gargles and plenty fluids.  #3 situational anxiety.  Wrote prescription for limited diazepam 5 mg to take 1 hour prior to procedure   No follow-ups on file.    [name], MD

## 2022-06-29 ENCOUNTER — Other Ambulatory Visit: Payer: Self-pay | Admitting: Family Medicine

## 2022-06-29 ENCOUNTER — Ambulatory Visit
Admission: RE | Admit: 2022-06-29 | Discharge: 2022-06-29 | Disposition: A | Discharge status: Home / Self Care | Payer: Medicare HMO | Source: Ambulatory Visit | Attending: Family Medicine | Admitting: Family Medicine

## 2022-06-29 DIAGNOSIS — J029 Acute pharyngitis, unspecified: Secondary | ICD-10-CM

## 2022-06-29 DIAGNOSIS — F418 Other specified anxiety disorders: Secondary | ICD-10-CM

## 2022-06-29 DIAGNOSIS — N644 Mastodynia: Secondary | ICD-10-CM

## 2022-08-02 ENCOUNTER — Encounter: Payer: Self-pay | Admitting: Family Medicine

## 2022-08-02 DIAGNOSIS — G600 Hereditary motor and sensory neuropathy: Secondary | ICD-10-CM

## 2022-08-03 DIAGNOSIS — M79671 Pain in right foot: Secondary | ICD-10-CM | POA: Diagnosis not present

## 2022-08-03 DIAGNOSIS — M25561 Pain in right knee: Secondary | ICD-10-CM | POA: Diagnosis not present

## 2022-08-06 ENCOUNTER — Telehealth: Payer: Self-pay | Admitting: Family Medicine

## 2022-08-06 NOTE — Telephone Encounter (Signed)
Office notes faxed 

## 2022-08-06 NOTE — Telephone Encounter (Signed)
Haley Kennedy with  [facility]ist  is calling and needs office notes pertaining to CMT please fax to [phone]. There received office note for 06-26-2022 only

## 2022-08-15 DIAGNOSIS — M48061 Spinal stenosis, lumbar region without neurogenic claudication: Secondary | ICD-10-CM | POA: Diagnosis not present

## 2022-08-21 ENCOUNTER — Other Ambulatory Visit (HOSPITAL_COMMUNITY): Payer: Self-pay | Admitting: Neurosurgery

## 2022-08-21 DIAGNOSIS — M48061 Spinal stenosis, lumbar region without neurogenic claudication: Secondary | ICD-10-CM

## 2022-08-24 DIAGNOSIS — R509 Fever, unspecified: Secondary | ICD-10-CM | POA: Diagnosis not present

## 2022-08-24 DIAGNOSIS — J329 Chronic sinusitis, unspecified: Secondary | ICD-10-CM | POA: Diagnosis not present

## 2022-08-24 DIAGNOSIS — R059 Cough, unspecified: Secondary | ICD-10-CM | POA: Diagnosis not present

## 2022-08-24 DIAGNOSIS — J3489 Other specified disorders of nose and nasal sinuses: Secondary | ICD-10-CM | POA: Diagnosis not present

## 2022-08-28 ENCOUNTER — Telehealth: Payer: Self-pay

## 2022-08-28 NOTE — Telephone Encounter (Signed)
---  Caller says that she has a dry cough, and chills.  08/24/2022 10:50:46 AM See HCP within 4 Hours (or PCP triage) Radford Pax, RN, Eugene Garnet  Comments User: Susie Cassette, RN Date/Time Eilene Ghazi Time): 08/24/2022 10:51:36 AM Office is booked for today, advised to proceed to UC. She verbalizes understanding.  Referrals GO TO FACILITY UNDECIDED  08/28/22 1055 - Pt went to UC & was told she had the flu. Was given Tamiflu, abx. Pt states she's feeling better today. Denies SOB or fever. Pt does not need anything further at this time.

## 2022-09-01 ENCOUNTER — Ambulatory Visit (HOSPITAL_COMMUNITY): Payer: Medicare HMO

## 2022-09-10 DIAGNOSIS — Z20822 Contact with and (suspected) exposure to covid-19: Secondary | ICD-10-CM | POA: Diagnosis not present

## 2022-09-10 DIAGNOSIS — J205 Acute bronchitis due to respiratory syncytial virus: Secondary | ICD-10-CM | POA: Diagnosis not present

## 2022-09-12 ENCOUNTER — Encounter (HOSPITAL_COMMUNITY): Payer: Self-pay

## 2022-09-12 ENCOUNTER — Ambulatory Visit (HOSPITAL_COMMUNITY): Payer: Medicare HMO

## 2022-10-05 ENCOUNTER — Ambulatory Visit (INDEPENDENT_AMBULATORY_CARE_PROVIDER_SITE_OTHER): Payer: Medicare HMO

## 2022-10-05 VITALS — Ht 66.0 in

## 2022-10-05 DIAGNOSIS — Z0000 Encounter for general adult medical examination without abnormal findings: Secondary | ICD-10-CM

## 2022-10-05 NOTE — Patient Instructions (Addendum)
[name] , Thank you for taking time to come for your Medicare Wellness Visit. I appreciate your ongoing commitment to your health goals. Please review the following plan we discussed and let me know if I can assist you in the future.   These are the goals we discussed:  Goals       Lose weight (pt-stated)      Try to get down to a size large instead of X-large         This is a list of the screening recommended for you and due dates:  Health Maintenance  Topic Date Due   Flu Shot  11/25/2022*   Colon Cancer Screening  05/08/2023*   Zoster (Shingles) Vaccine (1 of 2) 08/07/2034*   DTaP/Tdap/Td vaccine (2 - Td or Tdap) 10/15/2022   Medicare Annual Wellness Visit  10/06/2023   Mammogram  06/29/2024   Pneumonia Vaccine  Completed   DEXA scan (bone density measurement)  Completed   Hepatitis C Screening: USPSTF Recommendation to screen - Ages 41-79 yo.  Completed   HPV Vaccine  Aged Out   COVID-19 Vaccine  Discontinued  *Topic was postponed. The date shown is not the original due date.    Advanced directives: Advance directive discussed with you today. Even though you declined this today, please call our office should you change your mind, and we can give you the proper paperwork for you to fill out.   Conditions/risks identified: None  Next appointment: Follow up in one year for your annual wellness visit     Preventive Care 65 Years and Older, Female Preventive care refers to lifestyle choices and visits with your health care provider that can promote health and wellness. What does preventive care include? A yearly physical exam. This is also called an annual well check. Dental exams once or twice a year. Routine eye exams. Ask your health care provider how often you should have your eyes checked. Personal lifestyle choices, including: Daily care of your teeth and gums. Regular physical activity. Eating a healthy diet. Avoiding tobacco and drug use. Limiting alcohol  use. Practicing safe sex. Taking low-dose aspirin every day. Taking vitamin and mineral supplements as recommended by your health care provider. What happens during an annual well check? The services and screenings done by your health care provider during your annual well check will depend on your age, overall health, lifestyle risk factors, and family history of disease. Counseling  Your health care provider may ask you questions about your: Alcohol use. Tobacco use. Drug use. Emotional well-being. Home and relationship well-being. Sexual activity. Eating habits. History of falls. Memory and ability to understand (cognition). Work and work Statistician. Reproductive health. Screening  You may have the following tests or measurements: Height, weight, and BMI. Blood pressure. Lipid and cholesterol levels. These may be checked every 5 years, or more frequently if you are over 75 years old. Skin check. Lung cancer screening. You may have this screening every year starting at age 50 if you have a 30-pack-year history of smoking and currently smoke or have quit within the past 15 years. Fecal occult blood test (FOBT) of the stool. You may have this test every year starting at age 73. Flexible sigmoidoscopy or colonoscopy. You may have a sigmoidoscopy every 5 years or a colonoscopy every 10 years starting at age 78. Hepatitis C blood test. Hepatitis B blood test. Sexually transmitted disease (STD) testing. Diabetes screening. This is done by checking your blood sugar (glucose) after you  have not eaten for a while (fasting). You may have this done every 1-3 years. Bone density scan. This is done to screen for osteoporosis. You may have this done starting at age 44. Mammogram. This may be done every 1-2 years. Talk to your health care provider about how often you should have regular mammograms. Talk with your health care provider about your test results, treatment options, and if necessary,  the need for more tests. Vaccines  Your health care provider may recommend certain vaccines, such as: Influenza vaccine. This is recommended every year. Tetanus, diphtheria, and acellular pertussis (Tdap, Td) vaccine. You may need a Td booster every 10 years. Zoster vaccine. You may need this after age 70. Pneumococcal 13-valent conjugate (PCV13) vaccine. One dose is recommended after age 94. Pneumococcal polysaccharide (PPSV23) vaccine. One dose is recommended after age 110. Talk to your health care provider about which screenings and vaccines you need and how often you need them. This information is not intended to replace advice given to you by your health care provider. Make sure you discuss any questions you have with your health care provider. Document Released: 09/09/2015 Document Revised: 05/02/2016 Document Reviewed: 06/14/2015 Elsevier Interactive Patient Education  2017 [place] Prevention in the Home Falls can cause injuries. They can happen to people of all ages. There are many things you can do to make your home safe and to help prevent falls. What can I do on the outside of my home? Regularly fix the edges of walkways and driveways and fix any cracks. Remove anything that might make you trip as you walk through a door, such as a raised step or threshold. Trim any bushes or trees on the path to your home. Use bright outdoor lighting. Clear any walking paths of anything that might make someone trip, such as rocks or tools. Regularly check to see if handrails are loose or broken. Make sure that both sides of any steps have handrails. Any raised decks and porches should have guardrails on the edges. Have any leaves, snow, or ice cleared regularly. Use sand or salt on walking paths during winter. Clean up any spills in your garage right away. This includes oil or grease spills. What can I do in the bathroom? Use night lights. Install grab bars by the toilet and in the  tub and shower. Do not use towel bars as grab bars. Use non-skid mats or decals in the tub or shower. If you need to sit down in the shower, use a plastic, non-slip stool. Keep the floor dry. Clean up any water that spills on the floor as soon as it happens. Remove soap buildup in the tub or shower regularly. Attach bath mats securely with double-sided non-slip rug tape. Do not have throw rugs and other things on the floor that can make you trip. What can I do in the bedroom? Use night lights. Make sure that you have a light by your bed that is easy to reach. Do not use any sheets or blankets that are too big for your bed. They should not hang down onto the floor. Have a firm chair that has side arms. You can use this for support while you get dressed. Do not have throw rugs and other things on the floor that can make you trip. What can I do in the kitchen? Clean up any spills right away. Avoid walking on wet floors. Keep items that you use a lot in easy-to-reach places. If you need  to reach something above you, use a strong step stool that has a grab bar. Keep electrical cords out of the way. Do not use floor polish or wax that makes floors slippery. If you must use wax, use non-skid floor wax. Do not have throw rugs and other things on the floor that can make you trip. What can I do with my stairs? Do not leave any items on the stairs. Make sure that there are handrails on both sides of the stairs and use them. Fix handrails that are broken or loose. Make sure that handrails are as long as the stairways. Check any carpeting to make sure that it is firmly attached to the stairs. Fix any carpet that is loose or worn. Avoid having throw rugs at the top or bottom of the stairs. If you do have throw rugs, attach them to the floor with carpet tape. Make sure that you have a light switch at the top of the stairs and the bottom of the stairs. If you do not have them, ask someone to add them for  you. What else can I do to help prevent falls? Wear shoes that: Do not have high heels. Have rubber bottoms. Are comfortable and fit you well. Are closed at the toe. Do not wear sandals. If you use a stepladder: Make sure that it is fully opened. Do not climb a closed stepladder. Make sure that both sides of the stepladder are locked into place. Ask someone to hold it for you, if possible. Clearly mark and make sure that you can see: Any grab bars or handrails. First and last steps. Where the edge of each step is. Use tools that help you move around (mobility aids) if they are needed. These include: Canes. Walkers. Scooters. Crutches. Turn on the lights when you go into a dark area. Replace any light bulbs as soon as they burn out. Set up your furniture so you have a clear path. Avoid moving your furniture around. If any of your floors are uneven, fix them. If there are any pets around you, be aware of where they are. Review your medicines with your doctor. Some medicines can make you feel dizzy. This can increase your chance of falling. Ask your doctor what other things that you can do to help prevent falls. This information is not intended to replace advice given to you by your health care provider. Make sure you discuss any questions you have with your health care provider. Document Released: 06/09/2009 Document Revised: 01/19/2016 Document Reviewed: 09/17/2014 Elsevier Interactive Patient Education  2017 [employer].

## 2022-10-05 NOTE — Progress Notes (Signed)
Subjective:   Haley Kennedy is a 77 y.o. female who presents for Medicare Annual (Subsequent) preventive examination.  Review of Systems    Virtual Visit via Telephone Note  I connected with  Haley Kennedy on 10/05/22 at 12:30 PM EST by telephone and verified that I am speaking with the correct person using two identifiers.  Location: Patient: Home Provider: Office Persons participating in the virtual visit: patient/Nurse Health Advisor   I discussed the limitations, risks, security and privacy concerns of performing an evaluation and management service by telephone and the availability of in person appointments. The patient expressed understanding and agreed to proceed.  Interactive audio and video telecommunications were attempted between this nurse and patient, however failed, due to patient having technical difficulties OR patient did not have access to video capability.  We continued and completed visit with audio only.  Some vital signs may be absent or patient reported.   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     10/05/2022   12:52 PM 04/24/2022    3:40 PM 02/24/2016    1:37 PM 07/11/2011    6:51 PM 06/28/2011   12:01 PM  Advanced Directives  Does Patient Have a Medical Advance Directive? No No No Patient does not have advance directive;Patient would like information Patient would not like information  Would patient like information on creating a medical advance directive? No - Patient declined No - Patient declined No - patient declined information Advance directive packet given   Pre-existing out of facility DNR order (yellow form or pink MOST form)     No    Current Medications (verified) Outpatient Encounter Medications as  of 10/05/2022  Medication Sig   cholecalciferol (VITAMIN D3) 25 MCG (1000 UT) tablet Take 1,000 Units by mouth daily.   diazepam (VALIUM) 5 MG tablet Take one tablet approximately one hour prior to procedure.   Grape Seed 60 MG CAPS Take 60 mg by mouth daily.    Multiple Vitamins-Minerals (ANTIOXIDANT PO) Take by mouth 1 day or 1 dose.   Omega-3 Fatty Acids (FISH OIL) 1000 MG CAPS Take by mouth. Take one capsule once daily   No facility-administered encounter medications on file as of 10/05/2022.    Allergies (verified) Corticosteroids, Nsaids, Penicillins, Prednisone, Vincristine, Adhesive [tape], Ciprofloxacin, Doxycycline, and Phenylephrine-guaifenesin   History: 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   Ulcer    Past Surgical History:  Procedure Laterality Date   ABDOMINAL HYSTERECTOMY     [id number]   BACK SURGERY     after back surgery had to have iron infusion   BACK SURGERY  2009   CHOLECYSTECTOMY     1997   FOOT SURGERY     1963   FRACTURE SURGERY     KNEE SURGERY     SPINE SURGERY     TONSILLECTOMY     Family History  Problem Relation Age of Onset   Pneumonia Father    Cancer Father        tonsillar cancer   Social History   Socioeconomic History   Marital status: Married    Spouse name: Not on file   Number of children: Not on file   Years of education: Not on file   Highest education level: Not on file  Occupational History   Not on file  Tobacco Use   Smoking status: Former    Types: Cigarettes     Quit date: 08/27/1978    Years since quitting: 44.1   Smokeless tobacco: Never  Vaping Use   Vaping Use: Never used  Substance and Sexual Activity   Alcohol use: No   Drug use: No   Sexual activity: Never  Other Topics Concern   Not on file  Social History Narrative   Not on file   Social Determinants of Health   Financial Resource Strain: Low Risk  (10/05/2022)   Overall Financial Resource Strain (CARDIA)    Difficulty of Paying Living Expenses: Not hard at all  Food Insecurity: No Food Insecurity (10/05/2022)   Hunger Vital Sign    Worried About Running Out of Food in the Last Year: Never true    [place] in the Last Year: Never true  Transportation Needs: No Transportation Needs (10/05/2022)   PRAPARE - Hydrologist (Medical): No    Lack of Transportation (Non-Medical): No  Physical Activity: Sufficiently Active (10/05/2022)   Exercise Vital Sign    Days of Exercise per Week: 7 days    Minutes of Exercise per Session: 60 min  Stress: No Stress Concern Present (10/05/2022)   [place]    Feeling of Stress : Not at all  Social Connections: [place] (10/05/2022)   Social Connection and Isolation Panel [NHANES]    Frequency of Communication with Friends and Family: More than three times a week    Frequency of Social Gatherings with Friends and Family: More than three times a week    Attends Religious Services: More than 4 times per year    Active Member of [employer] or Organizations: Yes    Attends Music therapist: More than 4 times per year    Marital Status: Married    Tobacco Counseling Counseling given: Not Answered   Clinical Intake:  Pre-visit preparation completed: Yes  Pain : No/denies pain     Nutritional Risks: None Diabetes: No  How often do you need to have someone help you when you read instructions, pamphlets, or other written  materials from your doctor or pharmacy?: 1 - Never  Diabetic?  No  Interpreter Needed?: No  Information entered by :: Rolene Arbour LPN   Activities of Daily Living    10/05/2022   12:45 PM  In your present state of health, do you have any difficulty performing the following activities:  Hearing? 0  Vision? 0  Difficulty concentrating or making decisions? 0  Walking or climbing stairs? 1  Comment Uses Power Wheelchair  Dressing or bathing? 0  Doing errands, shopping? Psychologist, educational and eating ? N  Using the Toilet? N  In the past six months, have you accidently leaked urine? Y  Comment Wears pads for protection  Do you have problems with loss of bowel control? N  Managing your Medications? N  Managing your Finances? N  Housekeeping or managing your Housekeeping? N    Patient Care Team: Eulas Post, MD as PCP - General (Family Medicine)  Indicate any recent Medical Services you may have received from other than [facility] providers in the past year (date may be approximate).     Assessment:   This is a routine wellness examination for Haley Kennedy.  Hearing/Vision screen Hearing Screening - Comments:: Denies hearing difficulties   Vision Screening - Comments:: Wears rx glasses - up to date with routine eye exams with  Dr Truman Hayward  Dietary issues and exercise activities discussed: Exercise limited by: Other - see comments (Dx  CMT)   Goals Addressed               This Visit's Progress     Lose weight (pt-stated)        Try to get down to a size large instead of X-large        Depression Screen    10/05/2022   12:43 PM 06/26/2022    2:16 PM 05/04/2022    9:30 AM 11/17/2019   10:10 AM 06/27/2018   11:15 AM 03/14/2015   10:00 AM 12/23/2013    9:49 AM  PHQ 2/9 Scores  PHQ - 2 Score 0 0 0 0 0 0 0    Fall Risk    10/05/2022   12:50 PM 05/04/2022    9:30 AM 11/17/2019   10:10 AM 06/27/2018   11:14 AM 03/14/2015   10:00 AM  [place] in the past year? 0 0 0 0  No  Number falls in past yr: 0 0 0    Injury with Fall? 0 0 0    Risk for fall due to : No Fall Risks No Fall Risks     Follow up Falls prevention discussed Falls evaluation completed Falls evaluation completed      [place]:  Any stairs in or around the home? No  If so, are there any without handrails? No  ds, electrical cords, etc? Yes  Adequate lighting in your home to reduce risk of falls? Yes   ASSISTIVE DEVICES UTILIZED TO PREVENT FALLS:  Life alert? No  Use of a cane, walker or w/c? Yes  Grab bars in the bathroom? Yes  Shower chair or bench in shower? Yes  Elevated toilet seat or a handicapped toilet? Yes   TIMED UP AND GO:  Was the test performed? No . Audio Visit   Cognitive Function:        10/05/2022   12:52 PM  6CIT Screen  What Year? 0 points  What month? 0 points  What time? 0 points  Count back from 20 0 points  Months in reverse 0 points  Repeat phrase 0 points  Total Score 0 points    Immunizations Immunization History  Administered Date(s) Administered   Fluad Quad(high Dose 65+) 07/29/2019   Pneumococcal Conjugate-13 03/14/2015   Pneumococcal Polysaccharide-23 03/21/2016   Tdap 10/15/2012    TDAP status: Up to date    Pneumococcal vaccine status: Up to date  Covid-19 vaccine status: Declined, Education has been provided regarding the importance of this vaccine but patient still declined. Advised may receive this vaccine at  local pharmacy or Health Dept.or vaccine clinic. Aware to provide a copy of the vaccination record if obtained from local pharmacy or Health Dept. Verbalized acceptance and understanding.     Screening Tests Health Maintenance  Topic Date Due   INFLUENZA VACCINE  11/25/2022 (Originally 03/27/2022)   COLONOSCOPY (Pts 45-22yr Insurance coverage will need to be confirmed)  05/08/2023 (Originally 02/25/2015)   Zoster Vaccines- Shingrix (1 of 2) 08/07/2034 (Originally 05/03/1965)    DTaP/Tdap/Td (2 - Td or Tdap) 10/15/2022   Medicare Annual Wellness (AWV)  10/06/2023   MAMMOGRAM  06/29/2024   Pneumonia Vaccine 75 Years old  Completed   DEXA SCAN  Completed   Hepatitis C Screening  Completed   HPV VACCINES  Aged Out   COVID-19 Vaccine  Discontinued    Health Maintenance  There are no preventive care reminders to display for this patient.     Mammogram status: Completed 06/29/22. Repeat every year  Bone Density status: Completed 10/22/12. Results reflect: Bone density results: OSTEOPENIA. Repeat every   years.  Lung Cancer Screening: (Low Dose CT Chest recommended if Age 75-80years, 30 pack-year currently smoking OR have quit w/in 15years.) does not qualify.     Additional Screening:  Hepatitis C Screening: does qualify; Completed 03/22/16  Vision Screening: Recommended annual ophthalmology exams for early detection of glaucoma and other disorders of the eye. Is the patient up to date with their annual eye exam?  Yes  Who is the provider or what is the name of the office in which the patient attends annual eye exams? Dr LTruman HaywardIf pt is not established with a provider, would they like to be referred to a provider to establish care? No .   Dental Screening: Recommended annual dental exams for proper oral hygiene  Community Resource Referral / Chronic Care Management:  CRR required this visit?  No   CCM required this visit?  No      Plan:     I have personally reviewed and noted the following in the patient's chart:   Medical and social history Use of alcohol, tobacco or illicit drugs  Current medications and supplements including opioid prescriptions. Patient is not currently taking opioid prescriptions. Functional ability and status Nutritional status Physical activity Advanced directives List of other physicians Hospitalizations, surgeries, and ER visits in previous 12 months Vitals Screenings to include cognitive, depression, and  falls Referrals and appointments  In addition, I have reviewed and discussed with patient certain preventive protocols, quality metrics, and best practice recommendations. A written personalized care plan for preventive services as well as general preventive health recommendations were provided to patient.     BCriselda Peaches LPN   [id number]  Nurse Notes: None

## 2022-11-08 DIAGNOSIS — M25562 Pain in left knee: Secondary | ICD-10-CM | POA: Diagnosis not present

## 2022-11-08 DIAGNOSIS — M79672 Pain in left foot: Secondary | ICD-10-CM | POA: Diagnosis not present

## 2022-11-20 ENCOUNTER — Encounter: Payer: Self-pay | Admitting: Family Medicine

## 2022-11-21 NOTE — Telephone Encounter (Signed)
Noted  

## 2022-11-27 DIAGNOSIS — M25562 Pain in left knee: Secondary | ICD-10-CM | POA: Diagnosis not present

## 2022-11-27 DIAGNOSIS — M6281 Muscle weakness (generalized): Secondary | ICD-10-CM | POA: Diagnosis not present

## 2022-11-30 DIAGNOSIS — R2242 Localized swelling, mass and lump, left lower limb: Secondary | ICD-10-CM | POA: Diagnosis not present

## 2022-12-28 DIAGNOSIS — G600 Hereditary motor and sensory neuropathy: Secondary | ICD-10-CM | POA: Diagnosis not present

## 2022-12-28 DIAGNOSIS — M79672 Pain in left foot: Secondary | ICD-10-CM | POA: Diagnosis not present

## 2023-01-09 ENCOUNTER — Ambulatory Visit: Payer: Medicare HMO | Attending: Orthopaedic Surgery

## 2023-01-09 ENCOUNTER — Other Ambulatory Visit: Payer: Self-pay

## 2023-01-09 DIAGNOSIS — M6281 Muscle weakness (generalized): Secondary | ICD-10-CM
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
## 2023-01-15 ENCOUNTER — Ambulatory Visit: Payer: Medicare HMO | Admitting: Physical Therapy

## 2023-01-17 ENCOUNTER — Encounter: Payer: Self-pay | Admitting: Physical Therapy

## 2023-01-17 ENCOUNTER — Ambulatory Visit: Payer: Medicare HMO | Admitting: Physical Therapy

## 2023-01-17 DIAGNOSIS — M6281 Muscle weakness (generalized): Secondary | ICD-10-CM

## 2023-01-17 NOTE — Therapy (Signed)
OUTPATIENT PHYSICAL THERAPY LOWER EXTREMITY TREATMENT   Patient Name: Haley Kennedy MRN: [id number] DOB:04/04/1946, 77 y.o., female Today's Date: 01/17/2023  END OF SESSION:  PT End of Session - 01/17/23 1350     Visit Number 2    Number of Visits 6    Date for PT Re-Evaluation 02/08/23    PT Start Time 1350    PT Stop Time 1435    PT Time Calculation (min) 45 min    Activity Tolerance Patient tolerated treatment well    Behavior During Therapy WFL for tasks assessed/performed            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   Ulcer    Past Surgical History:  Procedure Laterality Date   ABDOMINAL HYSTERECTOMY     34yrs ago   BACK SURGERY     after back surgery had to have iron infusion   BACK SURGERY     2009   CHOLECYSTECTOMY     1997   FOOT SURGERY     1963   FRACTURE SURGERY     KNEE SURGERY     SPINE SURGERY     TONSILLECTOMY     Patient Active Problem List   Diagnosis Date Noted   Throat burning 04/21/2020   Obesity 03/25/2020   Pain in left knee 09/03/2018   Sprain of MCL (medial collateral ligament) of knee 09/03/2018   Cold intolerance 10/01/2016   Essential tremor 10/14/2014   Charcot-Marie-Tooth disease 02/25/2012   Chronic back pain 02/25/2012   Peripheral neuropathy 02/25/2012   Pulmonary nodule, left 09/12/2011   Right rotator cuff tear 07/12/2011   Rotator cuff (capsule) sprain 07/09/2011   PCP: Kristian Covey, MD  REFERRING PROVIDER: Netta Cedars, MD   REFERRING  DIAG: Pain in left foot   THERAPY DIAG:  Muscle weakness (generalized)  Rationale for Evaluation and Treatment: Rehabilitation  ONSET DATE: March 2024  SUBJECTIVE:   SUBJECTIVE STATEMENT: Reports that she would like core strengthening as she feels like she slumps in her chair. Does have a pressure sore from sitting.  PERTINENT HISTORY: Allergies, Charcot-Marie-Tooth disease, neuropathy, right shoulder pain, chronic back pain, OA, and asthma  PAIN:  Are you having pain? No  PRECAUTIONS: Fall  WEIGHT BEARING RESTRICTIONS: No  FALLS:  Has patient fallen in last 6 months? 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: Needs assistance with ADLs and Needs assistance with gait  PATIENT GOALS: be able to get onto and ride her upright bike, and improved strength  NEXT MD VISIT: 02/12/23  OBJECTIVE:   COGNITION: Overall cognitive status: Within functional limits for tasks assessed     SENSATION: Light touch: Impaired  Diminished sensation in bilateral lower extremities with absent sensation to light touch below  the knee  EDEMA:  No edema observed  POSTURE: rounded shoulders, forward head, and increased thoracic kyphosis  PALPATION: No tenderness to palpation reported   LOWER EXTREMITY ROM: unable to be assessed due to extensive subjective history  LOWER EXTREMITY MMT:  MMT Right eval Left eval  Hip flexion 2+/5 3+/5  Hip extension    Hip abduction    Hip adduction 4/5 4/5  Hip internal rotation    Hip external rotation    Knee flexion 3/5 3/5  Knee extension Unable to perform full AROM against gravity Unable to perform full AROM against gravity  Ankle dorsiflexion Unable to assess due to AFO Unable to assess due to AFO  Ankle plantarflexion    Ankle inversion    Ankle eversion     (Blank rows = not  tested)  GAIT: Assistive device utilized: Wheelchair (power) Level of assistance: Modified independence  TODAY'S TREATMENT:                                                                                                                              DATE:    5/23 EXERCISE LOG  Exercise Repetitions and Resistance Comments  Core press X10 reps 5 sec holds   Shoulder row Yellow theraband x20 reps   Horizontal abduction Yellow theraband x20 reps Reported feeling shaking which is a negetive response for her  LAQ X15 reps   Seated ball squeeze X20 reps 3 sec holds   Seated clam Modified after difficulty with yellow theraband    Blank cell = exercise not performed today   PATIENT EDUCATION:  Education details: new HEP and modifications; written instructions for compliance Person educated: Patient Education method: Explanation Education comprehension: verbalized understanding  HOME EXERCISE PROGRAM: [id number] 01/17/23: [id number]  ASSESSMENT:  CLINICAL IMPRESSION: Patient presented in clinic with requests to work on core and posture as she feels like she slumps more in her WC. Patient uses UE greatly for tranfers with limitations of grip and finger use. Patient progressed through therex with constant communication regarding modifications for patient's specific needs. Patient's home and car are adapted per her needs. Education and instructed regarding throughout therex session and HEP instruction to encourage activity but not pushing beyond her capabilities. Written and demo instruction regarding along with verbal per patient request to fully understand HEP techniques and reps.  OBJECTIVE IMPAIRMENTS: decreased activity tolerance, decreased mobility, difficulty walking, decreased ROM, decreased strength, and postural dysfunction.   ACTIVITY LIMITATIONS: standing, stairs, transfers, and locomotion level  PARTICIPATION LIMITATIONS: shopping and community activity  PERSONAL FACTORS:  Past/current experiences, Time since onset of injury/illness/exacerbation, and 3+ comorbidities: Allergies, Charcot-Marie-Tooth disease, neuropathy, right shoulder pain, chronic back pain, OA, and asthma  are also affecting patient's functional outcome.   REHAB POTENTIAL: Fair    CLINICAL DECISION MAKING: Evolving/moderate complexity  EVALUATION COMPLEXITY: Moderate   GOALS: Goals reviewed with patient? Yes  LONG TERM GOALS: Target date: 01/30/23  Patient will be independent with her  home exercise program. Baseline:  Goal status: MET  2.  Patient will improve her bilateral knee flexion strength to at least 3+/5 for improved lower extremity strength. Baseline:  Goal status: On-going  3.  Patient will be able to demonstrate active knee extension within 5 degrees of neutral in sitting for improved quadriceps strength bilaterally. Baseline:  Goal status: On-going  PLAN:  PT FREQUENCY: 2x/week  PT DURATION: 3 weeks  PLANNED INTERVENTIONS: Therapeutic exercises, Therapeutic activity, Neuromuscular re-education, Balance training, Gait training, Patient/Family education, Self Care, Joint mobilization, Electrical stimulation, Manual therapy, and Re-evaluation  PLAN FOR NEXT SESSION: isometrics, seated ball press, lower and upper extremity strength (for transfers), and modalities as needed  Marvell Fuller, PTA 01/17/2023, 2:57 PM

## 2023-01-23 ENCOUNTER — Ambulatory Visit: Payer: Medicare HMO

## 2023-01-23 DIAGNOSIS — M6281 Muscle weakness (generalized): Secondary | ICD-10-CM | POA: Diagnosis not present

## 2023-01-23 NOTE — Therapy (Addendum)
 OUTPATIENT PHYSICAL THERAPY LOWER EXTREMITY TREATMENT   Patient Name: Haley Kennedy MRN: [id number] DOB:08-31-1945, 77 y.o., female Today's Date: 01/23/2023  END OF SESSION:  PT End of Session - 01/23/23 1344     Visit Number 3    Number of Visits 6    Date for PT Re-Evaluation 02/08/23    PT Start Time 1345    PT Stop Time 1430    PT Time Calculation (min) 45 min    Activity Tolerance Patient tolerated treatment well    Behavior During Therapy [facility] for tasks assessed/performed            Past Medical History:  Diagnosis Date   Allergy    Arthritis    low back and neck   Asthma    as a child   Chronic back pain    CMT (Charcot-Marie-Tooth disease)    Diverticulosis    Gastric ulcer    Hyperlipidemia    Hyperlipidemia    Peripheral neuropathy    Rotator cuff (capsule) sprain 07/09/2011   Shortness of breath    with exertion;pt states its bc shes not in shape    Swelling    from knee down;takes furosemide Haley   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   Ulcer    Past Surgical History:  Procedure Laterality Date   ABDOMINAL HYSTERECTOMY     84yrs ago   BACK SURGERY     after back surgery had to have iron infusion   BACK SURGERY     2009   CHOLECYSTECTOMY     1997   FOOT SURGERY     1963   FRACTURE SURGERY     KNEE SURGERY     SPINE SURGERY     TONSILLECTOMY     Patient Active Problem List   Diagnosis Date Noted   Throat burning 04/21/2020   Obesity 03/25/2020   Pain in left knee 09/03/2018   Sprain of MCL (medial collateral ligament) of knee 09/03/2018   Cold intolerance 10/01/2016   Essential tremor 10/14/2014   Charcot-Marie-Tooth disease 02/25/2012   Chronic back pain 02/25/2012   Peripheral neuropathy 02/25/2012   Pulmonary nodule, left 09/12/2011   Right rotator cuff tear 07/12/2011   Rotator cuff (capsule) sprain 07/09/2011   PCP: Kristian Covey, MD  REFERRING PROVIDER: Netta Cedars, MD   REFERRING  DIAG: Pain in left foot   THERAPY DIAG:  Muscle weakness (generalized)  Rationale for Evaluation and Treatment: Rehabilitation  ONSET DATE: March 2024  SUBJECTIVE:   SUBJECTIVE STATEMENT: Patient reports that her arms were really sore after her last appointment, but she feels like she is stronger now as she feels safer when moving at home.   PERTINENT HISTORY: Allergies, Charcot-Marie-Tooth disease, neuropathy, right shoulder pain, chronic back pain, OA, and asthma  PAIN:  Are you having pain? No  PRECAUTIONS: Fall  WEIGHT BEARING RESTRICTIONS: No  FALLS:  Has patient fallen in last 6 months? 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: Needs assistance with ADLs and Needs assistance with gait  PATIENT GOALS: be able to get onto and ride her upright bike, and improved strength  NEXT MD VISIT: 02/12/23  OBJECTIVE:   COGNITION: Overall cognitive status: Within functional limits for tasks assessed     SENSATION: Light touch: Impaired  Diminished sensation in bilateral lower extremities with  absent sensation to light touch below the knee  EDEMA:  No edema observed  POSTURE: rounded shoulders, forward head, and increased thoracic kyphosis  PALPATION: No tenderness to palpation reported   LOWER EXTREMITY ROM: unable to be assessed due to extensive subjective history  LOWER EXTREMITY MMT:  MMT Right eval Left eval  Hip flexion 2+/5 3+/5  Hip extension    Hip abduction    Hip adduction 4/5 4/5  Hip internal rotation    Hip external rotation    Knee flexion 3/5 3/5  Knee extension Unable to perform full AROM against gravity Unable to perform full AROM against gravity  Ankle dorsiflexion Unable to assess due to AFO Unable to assess due to AFO  Ankle plantarflexion    Ankle inversion    Ankle eversion     (Blank rows =  not tested)  GAIT: Assistive device utilized: Wheelchair (power) Level of assistance: Modified independence  TODAY'S TREATMENT:                                                                                                                              DATE:                                   5/29 EXERCISE LOG  Exercise Repetitions and Resistance Comments  Seated ball press 15 reps w/ 5 second hold   Resisted row Yellow t-band x 15 reps    Ab bracing 15 reps w/ 5 second hold   Seated hip ABD isometric 25 reps w/ 5 second hold   Seated press up 10 reps  For pressure relief in wheelchair    Blank cell = exercise not performed today       5/23 EXERCISE LOG  Exercise Repetitions and Resistance Comments  Core press X10 reps 5 sec holds   Shoulder row Yellow theraband x20 reps   Horizontal abduction Yellow theraband x20 reps Reported feeling shaking which is a negetive response for her  LAQ X15 reps   Seated ball squeeze X20 reps 3 sec holds   Seated clam Modified after difficulty with yellow theraband    Blank cell = exercise not performed today   PATIENT EDUCATION:  Education details: new HEP and modifications; anatomy, energy conservation, benefits of exercise, and communicating her low back symptoms with her neurologist  Person educated: Patient Education method: Explanation Education comprehension: verbalized understanding  HOME EXERCISE PROGRAM: [id number] 01/17/23: [id number] 01/23/23: [id number]  ASSESSMENT:  CLINICAL IMPRESSION: Patient presented to treatment with questions regarding her home exercise program. Her HEP was reviewed and modified to improve her ability to complete these interventions. She required moderate multimodal cueing with these interventions for proper exercise performance. She reported feeling comfortable with these new interventions and modifications. She was educated on the importance of energy conservation and how this can impact her condition. She  reported feeling good upon the  conclusion of treatment. She continues to require skilled physical therapy to address her remaining impairments to maximize her safety and functional mobility.   PHYSICAL THERAPY DISCHARGE SUMMARY  Visits from Start of Care: 3  Current functional level related to goals / functional outcomes: Patient was able to partially meet her goals for skilled physical therapy.    Remaining deficits: Knee strength and AROM    Education / Equipment: HEP    Patient agrees to discharge. Patient goals were partially met. Patient is being discharged due to not returning since the last visit.  Candi Leash, PT, DPT    OBJECTIVE IMPAIRMENTS: decreased activity tolerance, decreased mobility, difficulty walking, decreased ROM, decreased strength, and postural dysfunction.   ACTIVITY LIMITATIONS: standing, stairs, transfers, and locomotion level  PARTICIPATION LIMITATIONS: shopping and community activity  PERSONAL FACTORS: Past/current experiences, Time since onset of injury/illness/exacerbation, and 3+ comorbidities: Allergies, Charcot-Marie-Tooth disease, neuropathy, right shoulder pain, chronic back pain, OA, and asthma  are also affecting patient's functional outcome.   REHAB POTENTIAL: Fair    CLINICAL DECISION MAKING: Evolving/moderate complexity  EVALUATION COMPLEXITY: Moderate   GOALS: Goals reviewed with patient? Yes  LONG TERM GOALS: Target date: 01/30/23  Patient will be independent with her home exercise program. Baseline:  Goal status: MET  2.  Patient will improve her bilateral knee flexion strength to at least 3+/5 for improved lower extremity strength. Baseline:  Goal status: On-going  3.  Patient will be able to demonstrate active knee extension within 5 degrees of neutral in sitting for improved quadriceps strength bilaterally. Baseline:  Goal status: On-going  PLAN:  PT FREQUENCY: 2x/week  PT DURATION: 3 weeks  PLANNED INTERVENTIONS:  Therapeutic exercises, Therapeutic activity, Neuromuscular re-education, Balance training, Gait training, Patient/Family education, Self Care, Joint mobilization, Electrical stimulation, Manual therapy, and Re-evaluation  PLAN FOR NEXT SESSION: isometrics, seated ball press, lower and upper extremity strength (for transfers), and modalities as needed  Granville Lewis, PT 01/23/2023, 3:07 PM

## 2023-01-24 DIAGNOSIS — M48061 Spinal stenosis, lumbar region without neurogenic claudication: Secondary | ICD-10-CM | POA: Diagnosis not present

## 2023-01-30 ENCOUNTER — Other Ambulatory Visit (HOSPITAL_COMMUNITY): Payer: Self-pay | Admitting: Student

## 2023-01-30 DIAGNOSIS — M48061 Spinal stenosis, lumbar region without neurogenic claudication: Secondary | ICD-10-CM

## 2023-02-08 ENCOUNTER — Ambulatory Visit (HOSPITAL_COMMUNITY)
Admission: RE | Admit: 2023-02-08 | Discharge: 2023-02-08 | Disposition: A | Discharge status: Home / Self Care | Payer: Medicare HMO | Source: Ambulatory Visit | Attending: Student | Admitting: Student

## 2023-02-08 DIAGNOSIS — M48061 Spinal stenosis, lumbar region without neurogenic claudication: Secondary | ICD-10-CM | POA: Diagnosis not present

## 2023-02-08 DIAGNOSIS — M5126 Other intervertebral disc displacement, lumbar region: Secondary | ICD-10-CM | POA: Diagnosis not present

## 2023-02-12 DIAGNOSIS — G600 Hereditary motor and sensory neuropathy: Secondary | ICD-10-CM | POA: Diagnosis not present

## 2023-02-12 DIAGNOSIS — M792 Neuralgia and neuritis, unspecified: Secondary | ICD-10-CM | POA: Diagnosis not present

## 2023-02-12 DIAGNOSIS — M21372 Foot drop, left foot: Secondary | ICD-10-CM | POA: Diagnosis not present

## 2023-02-12 DIAGNOSIS — M21862 Other specified acquired deformities of left lower leg: Secondary | ICD-10-CM | POA: Diagnosis not present

## 2023-02-12 DIAGNOSIS — R29898 Other symptoms and signs involving the musculoskeletal system: Secondary | ICD-10-CM | POA: Diagnosis not present

## 2023-02-12 DIAGNOSIS — M21861 Other specified acquired deformities of right lower leg: Secondary | ICD-10-CM | POA: Diagnosis not present

## 2023-02-12 DIAGNOSIS — M21371 Foot drop, right foot: Secondary | ICD-10-CM | POA: Diagnosis not present

## 2023-02-14 DIAGNOSIS — M4316 Spondylolisthesis, lumbar region: Secondary | ICD-10-CM | POA: Diagnosis not present

## 2023-03-25 DIAGNOSIS — M4316 Spondylolisthesis, lumbar region: Secondary | ICD-10-CM | POA: Diagnosis not present

## 2023-04-02 ENCOUNTER — Ambulatory Visit (INDEPENDENT_AMBULATORY_CARE_PROVIDER_SITE_OTHER): Payer: Medicare HMO | Admitting: Family Medicine

## 2023-04-02 VITALS — BP 132/80 | HR 70 | Temp 98.1°F | Ht 66.0 in

## 2023-04-02 DIAGNOSIS — R0789 Other chest pain: Secondary | ICD-10-CM | POA: Diagnosis not present

## 2023-04-02 DIAGNOSIS — R0600 Dyspnea, unspecified: Secondary | ICD-10-CM

## 2023-04-02 NOTE — Patient Instructions (Signed)
We are setting up echocardiogram to further assess the shortness of breath.
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
## 2023-04-18 DIAGNOSIS — M21372 Foot drop, left foot: Secondary | ICD-10-CM | POA: Diagnosis not present

## 2023-04-18 DIAGNOSIS — M21371 Foot drop, right foot: Secondary | ICD-10-CM | POA: Diagnosis not present

## 2023-04-26 ENCOUNTER — Other Ambulatory Visit (HOSPITAL_BASED_OUTPATIENT_CLINIC_OR_DEPARTMENT_OTHER): Payer: Medicare HMO

## 2023-05-13 DIAGNOSIS — L821 Other seborrheic keratosis: Secondary | ICD-10-CM | POA: Diagnosis not present

## 2023-05-13 DIAGNOSIS — D1801 Hemangioma of skin and subcutaneous tissue: Secondary | ICD-10-CM | POA: Diagnosis not present

## 2023-05-14 ENCOUNTER — Telehealth: Payer: Self-pay | Admitting: Family Medicine

## 2023-05-14 ENCOUNTER — Encounter: Payer: Self-pay | Admitting: Family Medicine

## 2023-05-14 NOTE — Telephone Encounter (Signed)
Pt called to say she has an Echocardiogram appointment on Thursday, 05/16/23.  Pt states she is no longer taking the medication that made her sick // cough, and she no longer has any of the symptoms she reported that prompted the need for the echocardiogram, and is wondering if MD thinks she should cancel this appointment?  Please advise at your earliest convenience.

## 2023-05-15 NOTE — Telephone Encounter (Signed)
Patient informed of the message below and voiced understanding

## 2023-05-15 NOTE — Telephone Encounter (Signed)
Please see telephone encounter from 05/14/2023

## 2023-05-16 ENCOUNTER — Other Ambulatory Visit (HOSPITAL_BASED_OUTPATIENT_CLINIC_OR_DEPARTMENT_OTHER): Payer: Medicare HMO

## 2023-05-31 ENCOUNTER — Other Ambulatory Visit: Payer: Self-pay | Admitting: Family Medicine

## 2023-05-31 DIAGNOSIS — Z1211 Encounter for screening for malignant neoplasm of colon: Secondary | ICD-10-CM

## 2023-05-31 DIAGNOSIS — Z1212 Encounter for screening for malignant neoplasm of rectum: Secondary | ICD-10-CM

## 2023-06-04 DIAGNOSIS — R29898 Other symptoms and signs involving the musculoskeletal system: Secondary | ICD-10-CM | POA: Diagnosis not present

## 2023-06-04 DIAGNOSIS — G600 Hereditary motor and sensory neuropathy: Secondary | ICD-10-CM | POA: Diagnosis not present

## 2023-06-17 ENCOUNTER — Ambulatory Visit: Payer: Medicare HMO | Attending: Internal Medicine | Admitting: Internal Medicine

## 2023-06-17 ENCOUNTER — Encounter: Payer: Self-pay | Admitting: Internal Medicine

## 2023-06-17 VITALS — BP 136/78 | HR 64 | Ht 66.0 in | Wt 190.0 lb

## 2023-06-17 DIAGNOSIS — R0602 Shortness of breath: Secondary | ICD-10-CM

## 2023-06-17 DIAGNOSIS — R0989 Other specified symptoms and signs involving the circulatory and respiratory systems: Secondary | ICD-10-CM | POA: Diagnosis not present

## 2023-06-17 NOTE — Progress Notes (Signed)
Cardiology Office Note   Date:  06/17/2023   ID:  Haley Kennedy, DOB 22-Oct-1945, MRN [id number]  PCP:  Kristian Covey, MD  Cardiologist:   Dietrich Pates, MD   Pt presents for preop evaluation      History of Present Illness: Haley Kennedy is a 77 y.o. female with no prior cardiac hx   Has hx of Charcot Marie Tooth.  Activity is limited  The pt recently used Voltaran cream on feet   Developed chest discomfort and SOB when using   Used for 2 days  Stopped   CP went away first   4 days later SOB went away       Has had none since  No further CP    Does get SOB with transfers from wheelchair Denies dizziness, no syncope  The pt wears support socks   Without socks, the pt says her feet are always cold      Pt is considering back surgery by Dr Dutch Quint     Current Meds  Medication Sig   Grape Seed 60 MG CAPS Take 60 mg by mouth daily.    Multiple Vitamins-Minerals (ANTIOXIDANT PO) Take by mouth 1 day or 1 dose.   Omega-3 Fatty Acids (FISH OIL) 1000 MG CAPS Take by mouth. Take one capsule once daily     Allergies:   Corticosteroids, Nsaids, Penicillins, Prednisone, Vincristine, Adhesive [tape], Ciprofloxacin, Doxycycline, and Phenylephrine-guaifenesin   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 Her smoking use included cigarettes. She has never used smokeless tobacco. She reports that she does not drink alcohol and does not use drugs.   Family History:  The patient's family history includes Cancer in her father; Pneumonia in her father.    ROS:  Please see the history of present illness. All other systems are reviewed and  Negative to the above problem except as noted.    PHYSICAL EXAM: VS:  BP 136/78   Pulse 64   Ht 5\' 6"  (1.676 m)   Wt 190 lb (86.2 kg)   SpO2 96%   BMI 30.67 kg/m   GEN: Obese 77 yo examined in wheelchair HEENT: normal  Neck: no JVD, carotid bruit Cardiac: RRR; no murmurs   Tr  LE edema  Respiratory:  clear to auscultation bilaterally GI: soft, nontender,  No hepatomegaly  MS: Some contractures.  Braces on legs   EKG:  EKG is not ordered today.   Lipid Panel    Component Value Date/Time   CHOL 197 05/04/2022 1001   TRIG 135.0 05/04/2022 1001   HDL 63.80 05/04/2022 1001   CHOLHDL 3 05/04/2022 1001   VLDL 27.0 05/04/2022 1001   LDLCALC 106 (H) 05/04/2022 1001   LDLCALC 136 (H) 03/22/2020 1108   LDLDIRECT 133.5 10/15/2012 0941      Wt Readings from Last 3 Encounters:  06/17/23 190 lb (86.2 kg)  04/24/22 230 lb (104.3 kg)  10/15/12 230 lb (104.3 kg)      ASSESSMENT AND PLAN:  1  Preop assessment    Pt is not very active  Recent CP with Voltaran use  I am not convinced cardiac   Stopped when stopping cream.   Still gets winded/ tired with transfers from wheelchair     Will set up for an echo  2  Vascular.  Pt complains of cold feet without support socks    Most likely small vessel hyperreactivity   Will set up for LE vascular study to evaluate   3  Blood pressure  BP is a little high  Follow   No changes for now   4  HCM  LDL 106 in 2023      Signed, Dietrich Pates, MD  06/17/2023 1:54 PM    [facility] [address] [place], [place], [place]  [address] Phone: [phone]; Fax: [phone]

## 2023-06-17 NOTE — Patient Instructions (Signed)
Medication Instructions:   *If you need a refill on your cardiac medications before your next appointment, please call your pharmacy*   Lab Work:  If you have labs (blood work) drawn today and your tests are completely normal, you will receive your results only by: MyChart Message (if you have MyChart) OR A paper copy in the mail If you have any lab test that is abnormal or we need to change your treatment, we will call you to review the results.   Testing/Procedures: Your physician has requested that you have an echocardiogram. Echocardiography is a painless test that uses sound waves to create images of your heart. It provides your doctor with information about the size and shape of your heart and how well your heart's chambers and valves are working. This procedure takes approximately one hour. There are no restrictions for this procedure. Please do NOT wear cologne, perfume, aftershave, or lotions (deodorant is allowed). Please arrive 15 minutes prior to your appointment time.  Your physician has requested that you have a lower extremity arterial duplex. This test is an ultrasound of the arteries in the legs or arms. It looks at arterial blood flow in the legs and arms. Allow one hour for Lower and Upper Arterial scans. There are no restrictions or special instructions  Your physician has requested that you have an ankle brachial index (ABI). During this test an ultrasound and blood pressure cuff are used to evaluate the arteries that supply the arms and legs with blood. Allow thirty minutes for this exam. There are no restrictions or special instructions.    Follow-Up: At [facility], you and your health needs are our priority.  As part of our continuing mission to provide you with exceptional heart care, we have created designated Provider Care Teams.  These Care Teams include your primary Cardiologist (physician) and Advanced Practice Providers (APPs -  Physician Assistants  and Nurse Practitioners) who all work together to provide you with the care you need, when you need it.  We recommend signing up for the patient portal called "MyChart".  Sign up information is provided on this After Visit Summary.  MyChart is used to connect with patients for Virtual Visits (Telemedicine).  Patients are able to view lab/test results, encounter notes, upcoming appointments, etc.  Non-urgent messages can be sent to your provider as well.   To learn more about what you can do with MyChart, go to [email or web address].    Your next appointment:  will plan after testing

## 2023-06-18 DIAGNOSIS — H10013 Acute follicular conjunctivitis, bilateral: Secondary | ICD-10-CM | POA: Diagnosis not present

## 2023-06-28 ENCOUNTER — Ambulatory Visit (HOSPITAL_BASED_OUTPATIENT_CLINIC_OR_DEPARTMENT_OTHER)
Admission: RE | Admit: 2023-06-28 | Discharge: 2023-06-28 | Disposition: A | Discharge status: Home / Self Care | Payer: Medicare HMO | Source: Ambulatory Visit | Attending: Internal Medicine | Admitting: Internal Medicine

## 2023-06-28 ENCOUNTER — Ambulatory Visit (HOSPITAL_COMMUNITY)
Admission: RE | Admit: 2023-06-28 | Discharge: 2023-06-28 | Disposition: A | Discharge status: Home / Self Care | Payer: Medicare HMO | Source: Ambulatory Visit | Attending: Internal Medicine | Admitting: Internal Medicine

## 2023-06-28 DIAGNOSIS — R0989 Other specified symptoms and signs involving the circulatory and respiratory systems: Secondary | ICD-10-CM | POA: Diagnosis not present

## 2023-06-29 LAB — VAS US ABI WITH/WO TBI
Left ABI: 1.66
Right ABI: 1.94

## 2023-07-09 ENCOUNTER — Telehealth: Payer: Self-pay | Admitting: Family Medicine

## 2023-07-09 NOTE — Telephone Encounter (Signed)
Pt called to say she has a terrible rash from using [employer] ointment. Pt was offered and OV / VV and refused, stating she just does not have the time.  Pt says she knows what the rash is from and just wants an Rx.  Please call Pt back to discuss.

## 2023-07-10 NOTE — Telephone Encounter (Signed)
I spoke with the patient and informed her of the message below. Patient was offered virtual visit but declined as she stated she has meeting today and cannot make appointment. Patient stated she would take a Benadryl today for rash.

## 2023-07-11 ENCOUNTER — Ambulatory Visit (HOSPITAL_COMMUNITY): Payer: Medicare HMO | Attending: Internal Medicine

## 2023-07-11 DIAGNOSIS — R0989 Other specified symptoms and signs involving the circulatory and respiratory systems: Secondary | ICD-10-CM | POA: Diagnosis not present

## 2023-07-11 DIAGNOSIS — R0602 Shortness of breath: Secondary | ICD-10-CM

## 2023-07-11 LAB — ECHOCARDIOGRAM COMPLETE
Area-P 1/2: 4.08 cm2
P 1/2 time: 504 ms
S' Lateral: 1.9 cm

## 2023-07-15 DIAGNOSIS — M2351 Chronic instability of knee, right knee: Secondary | ICD-10-CM | POA: Diagnosis not present

## 2023-07-16 ENCOUNTER — Telehealth: Payer: Self-pay

## 2023-07-16 DIAGNOSIS — R0602 Shortness of breath: Secondary | ICD-10-CM

## 2023-07-16 NOTE — Telephone Encounter (Signed)
The patient has been notified of the result and verbalized understanding.  All questions (if any) were answered. Bertram Millard, RN 07/16/2023 5:06 PM   Pt verbalized understanding of the lexiscan instructions will send her instructions to her my chart.

## 2023-07-16 NOTE — Telephone Encounter (Signed)
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

## 2023-07-17 ENCOUNTER — Telehealth (HOSPITAL_COMMUNITY): Payer: Self-pay | Admitting: Internal Medicine

## 2023-07-17 NOTE — Telephone Encounter (Signed)
I called to schedule patient Myoview and she declined for the reason below:   07/17/23 Patient declined to schedule due to her husbands mom had it and states that her heart was about to burst... 11:02/LBW   Order will be removed from the [facility] WQ. Thank you.

## 2023-08-14 ENCOUNTER — Ambulatory Visit: Payer: Self-pay | Admitting: Family Medicine

## 2023-08-14 NOTE — Telephone Encounter (Signed)
Copied from CRM [id number]. Topic: Appointments - Appointment Scheduling >> Aug 14, 2023 11:38 AM Sonny Dandy B wrote: Patient/patient representative is calling to schedule an appointment. Refer to attachments for appointment information.

## 2023-08-14 NOTE — Telephone Encounter (Signed)
Chief Complaint: Strange sensation to head and face, resolved Symptoms: Strange sensation to head and face yesterday, less than a minute each time, now resolved and has not had similar since yesterday.  Frequency: Intermittent/resolved Pertinent Negatives: Patient denies Weakness, facial numbness, facial droop, slurred speech, confusion Disposition: [] ED /[] Urgent Care (no appt availability in office) / [x] Appointment(In office/virtual)/ []  [facility]/ [] Home Care/ [] Refused Recommended Disposition /[] [place] [facility]/ []  Follow-up with PCP Additional Notes: Patient reports that twice yesterday she had a strange sensation to the top of her head that radiated to her face "and I felt like I was going to pass out." She reports that her symptoms resolved when she moved. She reports that she has not had any further symptoms since yesterday. Patient requesting an in office visit and states she would prefer not to go to the ED. Advised patient that I would make an appointment for her but that if her symptoms return or if there were any signs of weakness, slurred speech, facial droop, or confusion to go tot he ED. She voiced understanding of this.   Reason for Disposition  [1] Numbness or tingling on both sides of body AND [2] is a new symptom present > 24 hours  Answer Assessment - Initial Assessment Questions 1. SYMPTOM: "What is the main symptom you are concerned about?" (e.g., weakness, numbness)     Patient reports a sensation at the top of her head that comes over her face and "I felt like I was going to pass out but as soon as I moved it stopped." 2. ONSET: "When did this start?" (minutes, hours, days; while sleeping)     Yesterday, 2 times, less than a minute each time.  3. LAST NORMAL: "When was the last time you (the patient) were normal (no symptoms)?"     Yesterday 4. PATTERN "Does this come and go, or has it been constant since it started?"  "Is it present now?"      Happened twice yesterday, and has not occurred again since then.  5. CARDIAC SYMPTOMS: "Have you had any of the following symptoms: chest pain, difficulty breathing, palpitations?"     No 6. NEUROLOGIC SYMPTOMS: "Have you had any of the following symptoms: headache, dizziness, vision loss, double vision, changes in speech, unsteady on your feet?"     No 7. OTHER SYMPTOMS: "Do you have any other symptoms?"     No 8. PREGNANCY: "Is there any chance you are pregnant?" "When was your last menstrual period?"     No  Protocols used: Neurologic Deficit-A-AH

## 2023-08-16 ENCOUNTER — Ambulatory Visit (INDEPENDENT_AMBULATORY_CARE_PROVIDER_SITE_OTHER): Payer: Medicare HMO | Admitting: Family Medicine

## 2023-08-16 ENCOUNTER — Encounter: Payer: Self-pay | Admitting: Family Medicine

## 2023-08-16 VITALS — BP 152/78 | HR 71 | Temp 97.5°F

## 2023-08-16 DIAGNOSIS — R030 Elevated blood-pressure reading, without diagnosis of hypertension: Secondary | ICD-10-CM

## 2023-08-16 DIAGNOSIS — R42 Dizziness and giddiness: Secondary | ICD-10-CM

## 2023-08-16 NOTE — Progress Notes (Unsigned)
Established Patient Office Visit  Subjective   Patient ID: Haley Kennedy, female    DOB: Dec 09, 1945  Age: 77 y.o. MRN: [id number]  Chief Complaint  Patient presents with   Leg Pain   Tinnitus    HPI  {History (Optional):23778} Haley Kennedy is seen today for the following items  She has some recent severe low back pain.  Had MRI scan recently and was being considered for possible low back surgery.  She was referred for echocardiogram which was mostly unremarkable.  She decided she did not wish to go through nuclear stress test.  Surgeries been placed on hold at this time.  She is coping fairly well with her back pain.  She describes odd sensation for the past several weeks.  She describes a sensation of a "wave that spreads from top of her head down over both eyes and into her lower face region.  Denies any actual visual changes.  No speech changes.  No focal weakness.  She notices when she has these episodes if she moves her head the symptoms seem to stop.  Has not had any now in a few days.  She states the sensation is like a "dark cloud ".  No history of syncope  No history of hypertension.  She did have 1 home reading over 140s systolic but generally well-controlled.  She is diligent and exercises regularly.  Denies any recent headaches  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   Ulcer    Past Surgical History:  Procedure Laterality Date   ABDOMINAL HYSTERECTOMY     51yrs ago   BACK SURGERY     after back surgery had to  have iron infusion   BACK SURGERY     2009   CHOLECYSTECTOMY     1997   FOOT SURGERY     1963   FRACTURE SURGERY     KNEE SURGERY     SPINE SURGERY     TONSILLECTOMY      reports that she quit smoking about 45 years ago. Her smoking use included cigarettes. She has never used smokeless tobacco. She reports that she does not drink alcohol and does not use drugs. family history includes Cancer in her father; Pneumonia in her father. Allergies  Allergen Reactions   Corticosteroids     CMT Disease   Nsaids    Penicillins Other (See Comments)    Increased body temp   Prednisone     Patient has Charcot-Marie-Tooth Disease and cannot have any steroids   Vincristine     CMT disease   Adhesive [Tape] Rash    plastic   Ciprofloxacin Rash   Doxycycline Rash   Phenylephrine-Guaifenesin Rash    Review of Systems  Constitutional:  Negative for chills, fever and malaise/fatigue.  Eyes:  Negative for blurred vision.  Respiratory:  Negative for shortness of breath.  Cardiovascular:  Negative for chest pain.  Neurological:  Negative for tremors, speech change, focal weakness, loss of consciousness, weakness and headaches.      Objective:     BP (!) 152/78 (BP Location: Left Arm, Cuff Size: Normal)   Pulse 71   Temp (!) 97.5 F (36.4 C) (Oral)   SpO2 98%  BP Readings from Last 3 Encounters:  08/16/23 (!) 152/78  06/17/23 136/78  04/02/23 132/80   Wt Readings from Last 3 Encounters:  06/17/23 190 lb (86.2 kg)  04/24/22 230 lb (104.3 kg)  10/15/12 230 lb (104.3 kg)      Physical Exam Vitals reviewed.  Constitutional:      General: She is not in acute distress.    Appearance: She is well-developed. She is not ill-appearing.  Eyes:     Pupils: Pupils are equal, round, and reactive to light.  Neck:     Thyroid: No thyromegaly.     Vascular: No JVD.     Comments: No carotid bruits Cardiovascular:     Rate and Rhythm: Normal rate and regular rhythm.     Heart sounds:      No gallop.  Pulmonary:     Effort: Pulmonary effort is normal. No respiratory distress.     Breath sounds: Normal breath sounds. No wheezing or rales.  Musculoskeletal:     Cervical back: Neck supple.  Neurological:     Mental Status: She is alert.      No results found for any visits on 08/16/23.  Last CBC Lab Results  Component Value Date   WBC 5.6 04/24/2022   HGB 13.9 04/24/2022   HCT 42.0 04/24/2022   MCV 92.9 04/24/2022   MCH 30.8 04/24/2022   RDW 12.5 04/24/2022   PLT 229 04/24/2022   Last metabolic panel Lab Results  Component Value Date   GLUCOSE 100 (H) 04/24/2022   NA 139 04/24/2022   K 4.3 04/24/2022   CL 105 04/24/2022   CO2 27 04/24/2022   BUN 8 04/24/2022   CREATININE 0.36 (L) 04/24/2022   GFRNONAA >60 04/24/2022   CALCIUM 9.4 04/24/2022   PROT 7.3 05/04/2022   ALBUMIN 4.1 05/04/2022   BILITOT 0.4 05/04/2022   ALKPHOS 42 05/04/2022   AST 27 05/04/2022   ALT 26 05/04/2022   ANIONGAP 7 04/24/2022   Last lipids Lab Results  Component Value Date   CHOL 197 05/04/2022   HDL 63.80 05/04/2022   LDLCALC 106 (H) 05/04/2022   LDLDIRECT 133.5 10/15/2012   TRIG 135.0 05/04/2022   CHOLHDL 3 05/04/2022   Last hemoglobin A1c No results found for: "HGBA1C" Last thyroid functions Lab Results  Component Value Date   TSH 3.69 05/04/2022      The 10-year ASCVD risk score (Arnett DK, et al., 2019) is: 21.7%    Assessment & Plan:   #1 several week history of intermittent episodes as above of uncertain etiology.  No loss of consciousness.  She describes a "wave "spreading top of her head down over her face.  Does not sound like TIA or strokelike symptoms.  There is no loss of consciousness.  She had recent echo which was unremarkable.  Did not hear any carotid bruits we discussed setting up carotid Dopplers to further assess Consider neuroimaging probably with MRI scan if she continues to have similar symptoms  #2 elevated blood pressure without  diagnosis of hypertension.  Recommend close home monitoring next few weeks and be in touch if consistently greater than 140/90.  Watch sodium intake closely.     Return in about 3 weeks (around 09/06/2023).    Evelena Peat, MD

## 2023-08-16 NOTE — Patient Instructions (Signed)
Monitor blood pressure over next few weeks and be in touch if consistently > 140/90.

## 2023-08-17 ENCOUNTER — Other Ambulatory Visit: Payer: Self-pay

## 2023-08-17 ENCOUNTER — Emergency Department (HOSPITAL_COMMUNITY): Payer: Medicare HMO

## 2023-08-17 ENCOUNTER — Emergency Department (HOSPITAL_COMMUNITY)
Admission: EM | Admit: 2023-08-17 | Discharge: 2023-08-18 | Disposition: A | Discharge status: Home / Self Care | Payer: Medicare HMO | Attending: Student | Admitting: Student

## 2023-08-17 ENCOUNTER — Encounter (HOSPITAL_COMMUNITY): Payer: Self-pay

## 2023-08-17 DIAGNOSIS — I4891 Unspecified atrial fibrillation: Secondary | ICD-10-CM | POA: Diagnosis not present

## 2023-08-17 DIAGNOSIS — U071 COVID-19: Secondary | ICD-10-CM | POA: Insufficient documentation

## 2023-08-17 DIAGNOSIS — R000 Tachycardia, unspecified: Secondary | ICD-10-CM | POA: Diagnosis not present

## 2023-08-17 DIAGNOSIS — R059 Cough, unspecified: Secondary | ICD-10-CM | POA: Diagnosis present

## 2023-08-17 DIAGNOSIS — J45909 Unspecified asthma, uncomplicated: Secondary | ICD-10-CM | POA: Diagnosis not present

## 2023-08-17 LAB — BASIC METABOLIC PANEL
Anion gap: 9 (ref 5–15)
BUN: 13 mg/dL (ref 8–23)
CO2: 25 mmol/L (ref 22–32)
Calcium: 9.2 mg/dL (ref 8.9–10.3)
Chloride: 102 mmol/L (ref 98–111)
Creatinine, Ser: 0.42 mg/dL — ABNORMAL LOW (ref 0.44–1.00)
GFR, Estimated: >60 mL/min (ref >60)
Glucose, Bld: 104 mg/dL — ABNORMAL HIGH (ref 70–99)
Potassium: 4.1 mmol/L (ref 3.5–5.1)
Sodium: 136 mmol/L (ref 135–145)

## 2023-08-17 LAB — CBC
HCT: 39.4 % (ref 36.0–46.0)
Hemoglobin: 12.8 g/dL (ref 12.0–15.0)
MCH: 30.3 pg (ref 26.0–34.0)
MCHC: 32.5 g/dL (ref 30.0–36.0)
MCV: 93.4 fL (ref 80.0–100.0)
Platelets: 207 10*3/uL (ref 150–400)
RBC: 4.22 MIL/uL (ref 3.87–5.11)
RDW: 12.7 % (ref 11.5–15.5)
WBC: 6.4 10*3/uL (ref 4.0–10.5)
nRBC: 0 % (ref 0.0–0.2)

## 2023-08-17 NOTE — ED Triage Notes (Addendum)
Patient reports feeling sick while eating at [employer]. States her heart rate at home was as high as 116 and her blood pressure was elevated at 144/68. Patient states her heart rate is normally in the 70s. Patient states she has been having nasal drainage for a few days. States she went to see her cardiologist recently and everything was normal. No blood thinners. Patient denies chest pain. States she takes grape seed extract, fish oils and a multivitamin. Patient states she feels shaky but denies chest pain or shortness of breath. HR 125 in triage.

## 2023-08-18 LAB — RESP PANEL BY RT-PCR (RSV, FLU A&B, COVID)  RVPGX2
Influenza A by PCR: NEGATIVE
Influenza B by PCR: NEGATIVE
Resp Syncytial Virus by PCR: NEGATIVE
SARS Coronavirus 2 by RT PCR: POSITIVE — AB

## 2023-08-18 MED ORDER — BENZONATATE 100 MG PO CAPS: 100.0000 mg | ORAL_CAPSULE | Freq: Three times a day (TID) | ORAL | 0 refills | Status: DC

## 2023-08-18 MED ORDER — ONDANSETRON 4 MG PO TBDP: 4.0000 mg | ORAL_TABLET | Freq: Three times a day (TID) | ORAL | 0 refills | Status: DC | PRN

## 2023-08-18 MED ORDER — PAXLOVID (300/100) 20 X 150 MG & 10 X 100MG PO TBPK: 3.0000 | ORAL_TABLET | Freq: Two times a day (BID) | ORAL | 0 refills | Status: AC

## 2023-08-18 NOTE — Discharge Instructions (Addendum)
You are found to have COVID-19 today.  Remainder of your labs and chest x-ray today were normal.  Your elevated heart rate is likely from fever. Continue Tylenol or Motrin as needed for fever. I would recommend you stay isolated from others for at least 5 days as this is highly transmissible to others. Follow-up with your doctor. Return here for new concerns--chest pain, shortness of breath, uncontrolled fever, etc.

## 2023-08-18 NOTE — ED Provider Notes (Signed)
[place] EMERGENCY DEPARTMENT AT [facility] Provider Note   CSN: [id number] Arrival date & time: 08/17/23  2159     History  Chief Complaint  Patient presents with   Cough    Haley Kennedy is a 77 y.o. female.  The history is provided by the patient and medical records.  Cough Associated symptoms: chills    77 year old female with Charcot-Marie-Tooth disease, peripheral neuropathy, hyperlipidemia, asthma, resting tremors, presenting to the ED with congestion over the past few days.  States today she began having dry cough after chewing a piece of cinnamon gum.  She did not have any production of mucus.  After drinking some water it seems to have resolved but she has noticed she has had chills and her heart rate has been elevated above normal today.  She denies any chest pain or shortness of breath.  She denies any sick contacts.  Took tylenol PTA.  Home Medications Prior to Admission medications   Medication Sig Start Date End Date Taking? Authorizing Provider  benzonatate (TESSALON) 100 MG capsule Take 1 capsule (100 mg total) by mouth every 8 (eight) hours. 08/18/23  Yes Garlon Hatchet, PA-C  nirmatrelvir/ritonavir (PAXLOVID, 300/100,) 20 x 150 MG & 10 x 100MG  TBPK Take 3 tablets by mouth 2 (two) times daily for 5 days. Patient GFR is >60. Take nirmatrelvir (150 mg) two tablets twice daily for 5 days and ritonavir (100 mg) one tablet twice daily for 5 days. 08/18/23 08/23/23 Yes Garlon Hatchet, PA-C  ondansetron (ZOFRAN-ODT) 4 MG disintegrating tablet Take 1 tablet (4 mg total) by mouth every 8 (eight) hours as needed for nausea. 08/18/23  Yes Garlon Hatchet, PA-C  Grape Seed 60 MG CAPS Take 60 mg by mouth daily.     [provider]  Multiple Vitamins-Minerals (ANTIOXIDANT PO) Take by mouth 1 day or 1 dose.    [provider]  Omega-3 Fatty Acids (FISH OIL) 1000 MG CAPS Take by mouth. Take one capsule once daily    [provider]       Allergies    Corticosteroids, Nsaids, Penicillins, Prednisone, Vincristine, Adhesive [tape], Ciprofloxacin, Doxycycline, and Phenylephrine-guaifenesin    Review of Systems   Review of Systems  Constitutional:  Positive for chills.  HENT:  Positive for congestion.   Respiratory:  Positive for cough.   All other systems reviewed and are negative.   Physical Exam Updated Vital Signs BP 134/66 (BP Location: Left Arm)   Pulse (!) 125   Temp 99.6 F (37.6 C)   Resp 16   SpO2 98%   Physical Exam Vitals and nursing note reviewed.  Constitutional:      Appearance: She is well-developed.  HENT:     Head: Normocephalic and atraumatic.  Eyes:     Conjunctiva/sclera: Conjunctivae normal.     Pupils: Pupils are equal, round, and reactive to light.  Cardiovascular:     Rate and Rhythm: Normal rate and regular rhythm.     Heart sounds: Normal heart sounds.  Pulmonary:     Effort: Pulmonary effort is normal.     Breath sounds: Normal breath sounds. No wheezing or rhonchi.     Comments: Lung sounds are clear, speaking in paragraphs without difficulty, O2 sats 95-98% during exam Abdominal:     General: Bowel sounds are normal.     Palpations: Abdomen is soft.  Musculoskeletal:        General: Normal range of motion.     Cervical  back: Normal range of motion.  Skin:    General: Skin is warm and dry.  Neurological:     Mental Status: She is alert and oriented to person, place, and time.     ED Results / Procedures / Treatments   Labs (all labs ordered are listed, but only abnormal results are displayed) Labs Reviewed  RESP PANEL BY RT-PCR (RSV, FLU A&B, COVID)  RVPGX2 - Abnormal; Notable for the following components:      Result Value   SARS Coronavirus 2 by RT PCR POSITIVE (*)    All other components within normal limits  BASIC METABOLIC PANEL - Abnormal; Notable for the following components:   Glucose, Bld 104 (*)    Creatinine, Ser 0.42 (*)    All other components  within normal limits  CBC    EKG None  Radiology DG Chest Port 1 View Result Date: 08/17/2023 CLINICAL DATA:  Atrial fibrillation.  Rapid heart rate. EXAM: PORTABLE CHEST 1 VIEW COMPARISON:  06/28/2011 FINDINGS: The heart size and mediastinal contours are within normal limits. Both lungs are clear. The visualized skeletal structures are unremarkable. IMPRESSION: No active disease. Electronically Signed   By: Burman Nieves M.D.   On: 08/17/2023 23:20    Procedures Procedures    Medications Ordered in ED Medications - No data to display  ED Course/ Medical Decision Making/ A&P                                 Medical Decision Making Amount and/or Complexity of Data Reviewed Labs: ordered. Radiology: ordered and independent interpretation performed. ECG/medicine tests: ordered and independent interpretation performed.  Risk Prescription drug management.   77 year old female presenting to the ED with nasal congestion and cough that began today.  She has a low-grade fever here but is nontoxic in appearance.  She was tacky to the 120s in triage but this is improved to around 110 on my exam.  She is in no acute respiratory distress, able to speak in paragraphs without difficulty.  Labs reviewed--no leukocytosis or electrolyte derangement.  Chest x-ray is clear.  EKG sinus tach.  RVP is positive COVID-19.  She does report attending a Christmas singing with husband on Wednesday so possible exposure then.  She is of advanced age with comorbidities.  We discussed risk versus benefits of Paxlovid and she is open to this.  She remains hemodynamically stable.  She is primarily wheelchair-bound due to her foot disorder, however uses stairstepper every evening and is fairly active considering.  She is not having any chest pain or shortness of breath, lower suspicion for PE.  Will plan to discharge home with symptomatic care.  She was encouraged to follow-up closely with her primary care doctor.   Advised isolation practices for the next few days as this is highly transmissible.  Can return here for any new or acute changes.  Final Clinical Impression(s) / ED Diagnoses Final diagnoses:  COVID-19    Rx / DC Orders ED Discharge Orders          Ordered    nirmatrelvir/ritonavir (PAXLOVID, 300/100,) 20 x 150 MG & 10 x 100MG  TBPK  2 times daily        08/18/23 0024    ondansetron (ZOFRAN-ODT) 4 MG disintegrating tablet  Every 8 hours PRN        08/18/23 0024    benzonatate (TESSALON) 100 MG capsule  Every 8 hours  08/18/23 0024              Garlon Hatchet, PA-C 08/18/23 0031    Glendora Score, MD 08/20/23 [phone]

## 2023-09-02 ENCOUNTER — Telehealth: Payer: Self-pay | Admitting: Family Medicine

## 2023-09-02 ENCOUNTER — Encounter: Payer: Self-pay | Admitting: Family Medicine

## 2023-09-02 NOTE — Telephone Encounter (Signed)
 Patient is needing for dr Caryl Never nurse to call her back regarding her appt she was suppose to have a scan done prior to her appt she had covid and didn't get to the scan she would like a call back regarding this matter

## 2023-09-03 NOTE — Telephone Encounter (Signed)
 Please see Mychart encounter from 09/02/2023

## 2023-09-06 ENCOUNTER — Encounter: Payer: Self-pay | Admitting: Family Medicine

## 2023-09-06 ENCOUNTER — Ambulatory Visit (INDEPENDENT_AMBULATORY_CARE_PROVIDER_SITE_OTHER): Payer: Medicare Other | Admitting: Family Medicine

## 2023-09-06 VITALS — BP 130/74 | HR 64 | Temp 98.3°F

## 2023-09-06 DIAGNOSIS — R208 Other disturbances of skin sensation: Secondary | ICD-10-CM

## 2023-09-06 DIAGNOSIS — E785 Hyperlipidemia, unspecified: Secondary | ICD-10-CM

## 2023-09-06 DIAGNOSIS — R5383 Other fatigue: Secondary | ICD-10-CM

## 2023-09-06 LAB — LIPID PANEL
Cholesterol: 220 mg/dL — ABNORMAL HIGH (ref 0–200)
HDL: 70.2 mg/dL
LDL Cholesterol: 133 mg/dL — ABNORMAL HIGH (ref 0–99)
NonHDL: 149.4
Total CHOL/HDL Ratio: 3
Triglycerides: 80 mg/dL (ref 0.0–149.0)
VLDL: 16 mg/dL (ref 0.0–40.0)

## 2023-09-06 LAB — TSH: TSH: 2.54 u[IU]/mL (ref 0.35–5.50)

## 2023-09-06 NOTE — Progress Notes (Signed)
 Established Patient Office Visit  Subjective   Patient ID: Haley Kennedy, female    DOB: Oct 21, 1945  Age: 78 y.o. MRN: [id number]  Chief Complaint  Patient presents with   Medical Management of Chronic Issues    HPI   Haley Kennedy is here for medical follow-up.  She has history of Charcot-Marie-Tooth disease, essential tremor, peripheral neuropathy.  Was seen here recently on December 20 with odd sensation that occurred intermittently like a wave that spreads from top of her head down over her eyes and the lower face region.  No facial weakness.  No speech changes.  No visual changes.  No focal weakness.  We had ordered carotid Dopplers but she unfortunately came down with COVID the very next day.  Chest x-ray was unremarkable.  She was prescribed Paxlovid  and recovered fairly promptly.  She had basic metabolic panel and CBC which were essentially unremarkable.  She never went for Doppler recently.  She denies any recent obvious TIA type symptoms.  Still exercising very regularly.  Blood pressure was up somewhat last visit but home readings have consistently been well-controlled with mostly 120s systolic and 70s diastolic.  Patient is requesting lipids and thyroid .  She has had some fatigue issues.  Recent CBC and basic metabolic panel unremarkable.  She has had mild hyperlipidemia in the past but does not take any medications for this.  Past Medical History:  Diagnosis Date   Allergy    Arthritis    low back and neck   Asthma    as a child   Chronic back pain    CMT (Charcot-Marie-Tooth disease)    Diverticulosis    Gastric ulcer    Hyperlipidemia    Hyperlipidemia    Peripheral neuropathy    Rotator cuff (capsule) sprain 07/09/2011   Shortness of breath    with exertion;pt states its bc shes not in shape    Swelling    from knee down;takes furosemide  daily   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   Ulcer    Past Surgical History:  Procedure  Laterality Date   ABDOMINAL HYSTERECTOMY     67yrs ago   BACK SURGERY     after back surgery had to have iron infusion   BACK SURGERY     2009   CHOLECYSTECTOMY     1997   FOOT SURGERY     1963   FRACTURE SURGERY     KNEE SURGERY     SPINE SURGERY     TONSILLECTOMY      reports that she quit smoking about 45 years ago. Her smoking use included cigarettes. She has never used smokeless tobacco. She reports that she does not drink alcohol and does not use drugs. family history includes Cancer in her father; Pneumonia in her father. Allergies  Allergen Reactions   Corticosteroids     CMT Disease   Nsaids    Penicillins Other (See Comments)    Increased body temp   Prednisone     Patient has Charcot-Marie-Tooth Disease and cannot have any steroids   Vincristine     CMT disease   Adhesive [Tape] Rash    plastic   Ciprofloxacin Rash   Doxycycline  Rash   Phenylephrine-Guaifenesin  Rash    Review of Systems  Constitutional:  Positive for malaise/fatigue. Negative for chills, fever and weight loss.  Eyes:  Negative for blurred vision.  Respiratory:  Negative for shortness of breath.   Cardiovascular:  Negative for chest pain.  Neurological:  Negative for dizziness, focal weakness, weakness and headaches.      Objective:     BP 130/74 (BP Location: Left Arm, Patient Position: Sitting, Cuff Size: Normal)   Pulse 64   Temp 98.3 F (36.8 C) (Oral)   SpO2 100%  BP Readings from Last 3 Encounters:  09/06/23 130/74  08/18/23 107/75  08/16/23 (!) 152/78   Wt Readings from Last 3 Encounters:  06/17/23 190 lb (86.2 kg)  04/24/22 230 lb (104.3 kg)  10/15/12 230 lb (104.3 kg)      Physical Exam Vitals reviewed.  Constitutional:      General: She is not in acute distress.    Appearance: She is not ill-appearing.  Cardiovascular:     Rate and Rhythm: Normal rate and regular rhythm.  Pulmonary:     Effort: Pulmonary effort is normal.     Breath sounds: Normal breath  sounds. No wheezing or rales.  Musculoskeletal:     Comments: She has interosseous muscle wasting consistent with CMT  Neurological:     General: No focal deficit present.     Mental Status: She is alert.      No results found for any visits on 09/06/23.  Last CBC Lab Results  Component Value Date   WBC 6.4 08/17/2023   HGB 12.8 08/17/2023   HCT 39.4 08/17/2023   MCV 93.4 08/17/2023   MCH 30.3 08/17/2023   RDW 12.7 08/17/2023   PLT 207 08/17/2023   Last metabolic panel Lab Results  Component Value Date   GLUCOSE 104 (H) 08/17/2023   NA 136 08/17/2023   K 4.1 08/17/2023   CL 102 08/17/2023   CO2 25 08/17/2023   BUN 13 08/17/2023   CREATININE 0.42 (L) 08/17/2023   GFRNONAA >60 08/17/2023   CALCIUM 9.2 08/17/2023   PROT 7.3 05/04/2022   ALBUMIN 4.1 05/04/2022   BILITOT 0.4 05/04/2022   ALKPHOS 42 05/04/2022   AST 27 05/04/2022   ALT 26 05/04/2022   ANIONGAP 9 08/17/2023      The 10-year ASCVD risk score (Arnett DK, et al., 2019) is: 16.5%    Assessment & Plan:   #1 recent elevated blood pressure.  No diagnosis of hypertension.  Blood pressure back down today.  Consistently has had good home readings.  Continue to monitor  #2 history of mild hyperlipidemia.  Patient requesting lipid panel today.  She has had nothing but black coffee.  #3 fatigue probably related to recent COVID.  CBC normal.  Symptoms are slowly improving.  Check TSH  #4 recent vague dysesthesias involving face.  No focal weakness.  Symptoms improved at this time.  Would recommend observation.  Follow-up for any visual changes, speech changes, or focal weakness.   No follow-ups on file.    Wolm Scarlet, MD

## 2023-09-09 ENCOUNTER — Encounter: Payer: Self-pay | Admitting: Family Medicine

## 2023-09-09 DIAGNOSIS — H9390 Unspecified disorder of ear, unspecified ear: Secondary | ICD-10-CM

## 2023-09-11 ENCOUNTER — Emergency Department (HOSPITAL_COMMUNITY)
Admission: EM | Admit: 2023-09-11 | Discharge: 2023-09-11 | Disposition: A | Discharge status: Home / Self Care | Payer: Medicare Other | Attending: Emergency Medicine | Admitting: Emergency Medicine

## 2023-09-11 ENCOUNTER — Other Ambulatory Visit: Payer: Self-pay

## 2023-09-11 ENCOUNTER — Encounter (HOSPITAL_COMMUNITY): Payer: Self-pay

## 2023-09-11 ENCOUNTER — Emergency Department (HOSPITAL_COMMUNITY): Payer: Medicare Other

## 2023-09-11 DIAGNOSIS — R42 Dizziness and giddiness: Secondary | ICD-10-CM | POA: Diagnosis not present

## 2023-09-11 DIAGNOSIS — Z79899 Other long term (current) drug therapy: Secondary | ICD-10-CM | POA: Insufficient documentation

## 2023-09-11 DIAGNOSIS — J45909 Unspecified asthma, uncomplicated: Secondary | ICD-10-CM | POA: Insufficient documentation

## 2023-09-11 DIAGNOSIS — H547 Unspecified visual loss: Secondary | ICD-10-CM | POA: Diagnosis not present

## 2023-09-11 DIAGNOSIS — Z471 Aftercare following joint replacement surgery: Secondary | ICD-10-CM | POA: Diagnosis not present

## 2023-09-11 DIAGNOSIS — R9431 Abnormal electrocardiogram [ECG] [EKG]: Secondary | ICD-10-CM | POA: Diagnosis not present

## 2023-09-11 LAB — CBC
HCT: 37.8 % (ref 36.0–46.0)
Hemoglobin: 12.6 g/dL (ref 12.0–15.0)
MCH: 31.2 pg (ref 26.0–34.0)
MCHC: 33.3 g/dL (ref 30.0–36.0)
MCV: 93.6 fL (ref 80.0–100.0)
Platelets: 208 10*3/uL (ref 150–400)
RBC: 4.04 MIL/uL (ref 3.87–5.11)
RDW: 12.8 % (ref 11.5–15.5)
WBC: 5.3 10*3/uL (ref 4.0–10.5)
nRBC: 0 % (ref 0.0–0.2)

## 2023-09-11 LAB — I-STAT CHEM 8, ED
BUN: 10 mg/dL (ref 8–23)
BUN: 9 mg/dL (ref 8–23)
Calcium, Ion: 1.06 mmol/L — ABNORMAL LOW (ref 1.15–1.40)
Calcium, Ion: 1.18 mmol/L (ref 1.15–1.40)
Chloride: 106 mmol/L (ref 98–111)
Chloride: 103 mmol/L (ref 98–111)
Creatinine, Ser: 0.3 mg/dL — ABNORMAL LOW (ref 0.44–1.00)
Creatinine, Ser: 0.4 mg/dL — ABNORMAL LOW (ref 0.44–1.00)
Glucose, Bld: 86 mg/dL (ref 70–99)
Glucose, Bld: 86 mg/dL (ref 70–99)
HCT: 38 % (ref 36.0–46.0)
HCT: 37 % (ref 36.0–46.0)
Hemoglobin: 12.9 g/dL (ref 12.0–15.0)
Hemoglobin: 12.6 g/dL (ref 12.0–15.0)
Potassium: 4.5 mmol/L (ref 3.5–5.1)
Potassium: 4 mmol/L (ref 3.5–5.1)
Sodium: 139 mmol/L (ref 135–145)
Sodium: 141 mmol/L (ref 135–145)
TCO2: 28 mmol/L (ref 22–32)
TCO2: 28 mmol/L (ref 22–32)

## 2023-09-11 LAB — DIFFERENTIAL
Abs Immature Granulocytes: 0.01 10*3/uL (ref 0.00–0.07)
Basophils Absolute: 0 10*3/uL (ref 0.0–0.1)
Basophils Relative: 0 %
Eosinophils Absolute: 0.1 10*3/uL (ref 0.0–0.5)
Eosinophils Relative: 2 %
Immature Granulocytes: 0 %
Lymphocytes Relative: 44 %
Lymphs Abs: 2.3 10*3/uL (ref 0.7–4.0)
Monocytes Absolute: 0.5 10*3/uL (ref 0.1–1.0)
Monocytes Relative: 8 %
Neutro Abs: 2.4 10*3/uL (ref 1.7–7.7)
Neutrophils Relative %: 46 %

## 2023-09-11 LAB — COMPREHENSIVE METABOLIC PANEL
ALT: 20 U/L (ref 0–44)
AST: 21 U/L (ref 15–41)
Albumin: 3.8 g/dL (ref 3.5–5.0)
Alkaline Phosphatase: 36 U/L — ABNORMAL LOW (ref 38–126)
Anion gap: 10 (ref 5–15)
BUN: 8 mg/dL (ref 8–23)
CO2: 27 mmol/L (ref 22–32)
Calcium: 9.4 mg/dL (ref 8.9–10.3)
Chloride: 104 mmol/L (ref 98–111)
Creatinine, Ser: 0.36 mg/dL — ABNORMAL LOW (ref 0.44–1.00)
GFR, Estimated: >60 mL/min (ref >60)
Glucose, Bld: 90 mg/dL (ref 70–99)
Potassium: 4 mmol/L (ref 3.5–5.1)
Sodium: 141 mmol/L (ref 135–145)
Total Bilirubin: 0.8 mg/dL (ref 0.0–1.2)
Total Protein: 7.1 g/dL (ref 6.5–8.1)

## 2023-09-11 LAB — ETHANOL: Alcohol, Ethyl (B): <10 mg/dL (ref <10)

## 2023-09-11 LAB — CBG MONITORING, ED: Glucose-Capillary: 85 mg/dL (ref 70–99)

## 2023-09-11 MED ORDER — ONDANSETRON 4 MG PO TBDP: 4.0000 mg | ORAL_TABLET | Freq: Three times a day (TID) | ORAL | 0 refills | Status: DC | PRN

## 2023-09-11 MED ORDER — MECLIZINE HCL 25 MG PO TABS: 12.5000 mg | ORAL_TABLET | Freq: Three times a day (TID) | ORAL | 0 refills | Status: DC | PRN

## 2023-09-11 MED ORDER — IOHEXOL 350 MG/ML SOLN
75.0000 mL | Freq: Once | INTRAVENOUS | Status: AC | PRN
  Administered 2023-09-11: 75 mL via INTRAVENOUS

## 2023-09-11 NOTE — ED Notes (Signed)
 Pt does not want to get in the bed, wants to remain in her wheelchair.

## 2023-09-11 NOTE — ED Provider Triage Note (Signed)
Emergency Medicine Provider Triage Evaluation Note  Haley Kennedy , a 78 y.o. female  was evaluated in triage.  Pt complains of dizziness and near syncope.  Patient states that she rolled over in bed today while she was lying down and had room spinning dizziness followed by near loss of consciousness.  Patient states that when she sat up her symptoms improved.  Patient reports though that every time she looks down toward her feet her vision begins to darken and she feels like she is going to lose consciousness.  This occurred so significantly earlier today that she was unable to go to the bathroom by herself.  She uses a wheel chair that has a lift that she can normally stand up and get to the toilet by herself but states that she was too afraid she would lose consciousness.  She denies chest pain or shortness of breath.  She denies any other visual deficits..  She denies headache or neck pain  Review of Systems  Positive: LOC Negative: Fever  Physical Exam  BP 130/61 (BP Location: Right Arm)   Pulse 64   Temp 97.8 F (36.6 C)   Resp 17   Ht 5\' 6"  (1.676 m)   Wt 86.2 kg   SpO2 100%   BMI 30.67 kg/m  Gen:   Awake, no distress   Resp:  Normal effort  MSK:   Moves extremities without difficulty  Other:  No obvious neurodeficits  Medical Decision Making  Medically screening exam initiated at 12:47 PM.  Appropriate orders placed.  Haley Kennedy was informed that the remainder of the evaluation will be completed by another provider, this initial triage assessment does not replace that evaluation, and the importance of remaining in the ED until their evaluation is complete.  CT angiogram ordered."  Ordered on for   Arthor Captain, PA-C 09/11/23 1248

## 2023-09-11 NOTE — ED Provider Notes (Signed)
 [place] EMERGENCY DEPARTMENT AT [facility] Provider Note   CSN: [id number] Arrival date & time: 09/11/23  1227     History  Chief Complaint  Patient presents with   Dizziness    Haley Kennedy is a 78 y.o. female.   Dizziness Patient presented dizziness.  States she felt spinning dizziness.  Then tried to stand up felt weak.  Felt as if she could pass out.  Has had some issues moving her head.  Has had some ringing in her left ear.  No numbness or weakness.  Has had some near syncope before but not associated with the dizziness.  Uses a motorized wheelchair at baseline but does ambulate some.  Patient states she has seen ENT previously but does not know who it was.  States she was told she may have a tumor in her left ear but then had an MRI and said it was fine.    Past Medical History:  Diagnosis Date   Allergy    Arthritis    low back and neck   Asthma    as a child   Chronic back pain    CMT (Charcot-Marie-Tooth disease)    Diverticulosis    Gastric ulcer    Hyperlipidemia    Hyperlipidemia    Peripheral neuropathy    Rotator cuff (capsule) sprain 07/09/2011   Shortness of breath    with exertion;pt states its bc shes not in shape    Swelling    from knee down;takes furosemide  daily   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   Ulcer     Home Medications Prior to Admission medications   Medication Sig Start Date End Date Taking? Authorizing Provider  meclizine  (ANTIVERT ) 25 MG tablet Take 0.5-1 tablets (12.5-25 mg total) by mouth 3 (three) times daily as needed for dizziness. 09/11/23  Yes Mozell Arias, MD  ondansetron  (ZOFRAN -ODT) 4 MG disintegrating tablet Take 1 tablet (4 mg total) by mouth every 8 (eight) hours as needed. 09/11/23  Yes Mozell Arias, MD  Grape Seed 60 MG CAPS Take 60 mg by mouth daily.     [provider]  Multiple Vitamins-Minerals (ANTIOXIDANT PO) Take by mouth 1 day or 1 dose.     [provider]  Omega-3 Fatty Acids (FISH OIL) 1000 MG CAPS Take by mouth. Take one capsule once daily    [provider]      Allergies    Corticosteroids, Nsaids, Penicillins, Prednisone, Vincristine, Adhesive [tape], Ciprofloxacin, Doxycycline , and Phenylephrine-guaifenesin     Review of Systems   Review of Systems  Neurological:  Positive for dizziness.    Physical Exam Updated Vital Signs BP (!) 142/68   Pulse 80   Temp 97.7 F (36.5 C)   Resp 18   Ht 5\' 6"  (1.676 m)   Wt 86.2 kg   SpO2 100%   BMI 30.67 kg/m  Physical Exam Vitals and nursing note reviewed.  HENT:     Head: Atraumatic.     Right Ear: Tympanic membrane normal.     Left Ear: Tympanic membrane normal.  Eyes:     Extraocular Movements: Extraocular movements intact.     Comments: Some nystagmus with gaze to the left.  Cardiovascular:     Rate and Rhythm: Regular rhythm.  Musculoskeletal:     Cervical back: Neck supple.  Neurological:     Mental Status: She is alert.     Comments: Awake and appropriate.  Finger-nose  intact.  Some mild nystagmus with gaze to left.  Pupils otherwise reactive and eye movements intact.  No unilateral weakness.     ED Results / Procedures / Treatments   Labs (all labs ordered are listed, but only abnormal results are displayed) Labs Reviewed  COMPREHENSIVE METABOLIC PANEL - Abnormal; Notable for the following components:      Result Value   Creatinine, Ser 0.36 (*)    Alkaline Phosphatase 36 (*)    All other components within normal limits  I-STAT CHEM 8, ED - Abnormal; Notable for the following components:   Creatinine, Ser 0.30 (*)    Calcium, Ion 1.06 (*)    All other components within normal limits  I-STAT CHEM 8, ED - Abnormal; Notable for the following components:   Creatinine, Ser 0.40 (*)    All other components within normal limits  CBC  DIFFERENTIAL  ETHANOL  CBG MONITORING, ED    EKG EKG Interpretation Date/Time:  Wednesday  September 11 2023 12:37:31 EST Ventricular Rate:  66 PR Interval:  160 QRS Duration:  84 QT Interval:  390 QTC Calculation: 408 R Axis:   76  Text Interpretation: Normal sinus rhythm Nonspecific ST abnormality Abnormal ECG When compared with ECG of 17-Aug-2023 22:17, No significant change since last tracing Confirmed by Mozell Arias [phone]) on 09/11/2023 6:08:12 PM  Radiology CT ANGIO HEAD NECK W WO CM Result Date: 09/11/2023 CLINICAL DATA:  Vision loss EXAM: CT ANGIOGRAPHY HEAD AND NECK WITH AND WITHOUT CONTRAST TECHNIQUE: Multidetector CT imaging of the head and neck was performed using the standard protocol during bolus administration of intravenous contrast. Multiplanar CT image reconstructions and MIPs were obtained to evaluate the vascular anatomy. Carotid stenosis measurements (when applicable) are obtained utilizing NASCET criteria, using the distal internal carotid diameter as the denominator. RADIATION DOSE REDUCTION: This exam was performed according to the departmental dose-optimization program which includes automated exposure control, adjustment of the mA and/or kV according to patient size and/or use of iterative reconstruction technique. CONTRAST:  75mL OMNIPAQUE  IOHEXOL  350 MG/ML SOLN COMPARISON:  None Available. FINDINGS: CT HEAD FINDINGS Brain: No evidence of acute infarct, hemorrhage, mass, mass effect, or midline shift. No hydrocephalus or extra-axial fluid collection. Vascular: No hyperdense vessel. Skull: Negative for fracture or focal lesion. Sinuses/Orbits: No acute finding. Status post bilateral lens replacements. Other: The mastoid air cells are well aerated. CTA NECK FINDINGS Aortic arch: Standard branching. Imaged portion shows no evidence of aneurysm or dissection. No significant stenosis of the major arch vessel origins. Right carotid system: No evidence of dissection, occlusion, or hemodynamically significant stenosis (greater than 50%). Left carotid system: No evidence  of dissection, occlusion, or hemodynamically significant stenosis (greater than 50%). Vertebral arteries: No evidence of dissection, occlusion, or hemodynamically significant stenosis (greater than 50%). Skeleton: No acute osseous abnormality. Degenerative changes in the cervical spine. Other neck: No acute finding. Upper chest: No focal pulmonary opacity or pleural effusion. Review of the MIP images confirms the above findings CTA HEAD FINDINGS Anterior circulation: Both internal carotid arteries are patent to the termini, without significant stenosis. A1 segments patent. Normal anterior communicating artery. Anterior cerebral arteries are patent to their distal aspects without significant stenosis. No M1 stenosis or occlusion. MCA branches perfused to their distal aspects without significant stenosis. Posterior circulation: Vertebral arteries patent to the vertebrobasilar junction without significant stenosis. Posterior inferior cerebellar arteries patent proximally. Basilar patent to its distal aspect without significant stenosis. Superior cerebellar arteries patent proximally. Patent P1 segments. PCAs perfused to  their distal aspects without significant stenosis. The bilateral posterior communicating arteries are not visualized. Venous sinuses: As permitted by contrast timing, patent. Anatomic variants: None significant. No evidence of aneurysm or vascular malformation. Review of the MIP images confirms the above findings IMPRESSION: 1. No acute intracranial process. 2. No intracranial large vessel occlusion or significant stenosis. 3. No hemodynamically significant stenosis in the neck. Electronically Signed   By: Zoila Hines M.D.   On: 09/11/2023 16:11    Procedures Procedures    Medications Ordered in ED Medications  iohexol  (OMNIPAQUE ) 350 MG/ML injection 75 mL (75 mLs Intravenous Contrast Given 09/11/23 1441)    ED Course/ Medical Decision Making/ A&P                                 Medical  Decision Making Risk Prescription drug management.   Patient with dizziness and near syncope.  Workup overall reassuring.  Does sound somewhat like a peripheral vertigo.  Had been worse with certain positions of head.  States it was ringing in her left ear.  Left TM reassuring.  Mild nystagmus.  Otherwise nonacute exam.  I think most likely peripheral vertigo as opposed with central cause.  Doubt arrhythmia.  States she has had medicine before that dissolve in her mouth and helped her symptoms.  Potentially will try Zofran  but also give some meclizine .  Follow-up with PCP.  Doubt stroke.  Patient is eager to go home.        Final Clinical Impression(s) / ED Diagnoses Final diagnoses:  Vertigo    Rx / DC Orders ED Discharge Orders          Ordered    ondansetron  (ZOFRAN -ODT) 4 MG disintegrating tablet  Every 8 hours PRN        09/11/23 1826    meclizine  (ANTIVERT ) 25 MG tablet  3 times daily PRN        09/11/23 1826              Mozell Arias, MD 09/11/23 1831

## 2023-09-11 NOTE — ED Triage Notes (Signed)
 Pt came to ED for "sizzling" in left ear and states when she turns head, she feels like she is about to pass out. Denies falls and denies LOC. Axox4. Denies blurred vision and weakness.

## 2023-09-12 ENCOUNTER — Encounter: Payer: Self-pay | Admitting: Family Medicine

## 2023-09-16 ENCOUNTER — Ambulatory Visit (HOSPITAL_BASED_OUTPATIENT_CLINIC_OR_DEPARTMENT_OTHER): Payer: Medicare Other

## 2023-09-17 ENCOUNTER — Encounter: Payer: Self-pay | Admitting: Family Medicine

## 2023-09-17 ENCOUNTER — Ambulatory Visit (INDEPENDENT_AMBULATORY_CARE_PROVIDER_SITE_OTHER): Payer: Medicare Other | Admitting: Family Medicine

## 2023-09-17 VITALS — BP 138/70 | HR 67 | Temp 97.9°F

## 2023-09-17 DIAGNOSIS — R5383 Other fatigue: Secondary | ICD-10-CM

## 2023-09-17 DIAGNOSIS — R42 Dizziness and giddiness: Secondary | ICD-10-CM

## 2023-09-17 DIAGNOSIS — R7989 Other specified abnormal findings of blood chemistry: Secondary | ICD-10-CM

## 2023-09-17 NOTE — Progress Notes (Signed)
Established Patient Office Visit  Subjective   Patient ID: Haley Kennedy, female    DOB: 01-Jan-1946  Age: 78 y.o. MRN: [id number]  Chief Complaint  Patient presents with   Follow-up    Covid- Dec 22 nd and Inner Ear- Left- jan 15 seen in the ED,  patient's calcium and creatinine is low     HPI   Mabree has history of Charcot-Marie-Tooth, essential tremor, peripheral neuropathy.  Recent issues of vertigo.  She was diagnosed with COVID December 22.  She feels like she has recovered from that.  She then developed some vertigo symptoms and went to the ER on the 15th.  CT angiogram head and neck was done which showed no significant abnormalities.  No large vessel occlusion.  Patient had labs done and was concerned because she had a low creatinine-which has been low in the past.  As above, she does have Charcot-Marie-Tooth disease with some muscle atrophy.  She was placed on meclizine for vertigo symptoms and has had some increased fatigue since then.  Also some mild throat irritation.  No fever.  Vertigo symptoms have resolved at this time.  Patient had recently requested ENT referral and she has pending appointment in February  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   Ulcer    Past Surgical History:  Procedure Laterality Date   ABDOMINAL HYSTERECTOMY     65yrs ago   BACK SURGERY     after back surgery had to have iron infusion   BACK SURGERY     2009   CHOLECYSTECTOMY     1997   FOOT SURGERY     1963   FRACTURE  SURGERY     KNEE SURGERY     SPINE SURGERY     TONSILLECTOMY      reports that she quit smoking about 45 years ago. Her smoking use included cigarettes. She has never used smokeless tobacco. She reports that she does not drink alcohol and does not use drugs. family history includes Cancer in her father; Pneumonia in her father. Allergies  Allergen Reactions   Corticosteroids     CMT Disease   Nsaids    Penicillins Other (See Comments)    Increased body temp   Prednisone     Patient has Charcot-Marie-Tooth Disease and cannot have any steroids   Vincristine     CMT disease   Adhesive [Tape] Rash    plastic   Ciprofloxacin Rash   Doxycycline Rash   Phenylephrine-Guaifenesin Rash    Review of Systems  Constitutional:  Negative for chills and fever.  HENT:  Negative for sinus pain.   Respiratory:  Negative for cough.   Cardiovascular:  Negative for chest pain.  Gastrointestinal:  Negative for abdominal pain.  Genitourinary:  Negative for dysuria.  Neurological:  Negative for dizziness, focal weakness and headaches.  Objective:     BP 138/70 (BP Location: Left Arm, Patient Position: Sitting, Cuff Size: Normal)   Pulse 67   Temp 97.9 F (36.6 C) (Oral)   SpO2 98%  BP Readings from Last 3 Encounters:  09/17/23 138/70  09/11/23 (!) 142/68  09/06/23 130/74   Wt Readings from Last 3 Encounters:  09/11/23 190 lb (86.2 kg)  06/17/23 190 lb (86.2 kg)  04/24/22 230 lb (104.3 kg)      Physical Exam Vitals reviewed.  Constitutional:      General: She is not in acute distress.    Appearance: She is not ill-appearing.  HENT:     Right Ear: Tympanic membrane normal.     Left Ear: Tympanic membrane normal.     Mouth/Throat:     Comments: Posterior pharynx slightly dry but no exudate.  No lesions. Eyes:     Pupils: Pupils are equal, round, and reactive to light.  Cardiovascular:     Rate and Rhythm: Normal rate and regular rhythm.  Pulmonary:     Effort: Pulmonary  effort is normal.     Breath sounds: Normal breath sounds. No wheezing or rales.  Neurological:     General: No focal deficit present.     Mental Status: She is alert.      No results found for any visits on 09/17/23.  Last CBC Lab Results  Component Value Date   WBC 5.3 09/11/2023   HGB 12.6 09/11/2023   HCT 37.0 09/11/2023   MCV 93.6 09/11/2023   MCH 31.2 09/11/2023   RDW 12.8 09/11/2023   PLT 208 09/11/2023   Last metabolic panel Lab Results  Component Value Date   GLUCOSE 86 09/11/2023   NA 141 09/11/2023   K 4.0 09/11/2023   CL 103 09/11/2023   CO2 27 09/11/2023   BUN 9 09/11/2023   CREATININE 0.40 (L) 09/11/2023   GFRNONAA >60 09/11/2023   CALCIUM 9.4 09/11/2023   PROT 7.1 09/11/2023   ALBUMIN 3.8 09/11/2023   BILITOT 0.8 09/11/2023   ALKPHOS 36 (L) 09/11/2023   AST 21 09/11/2023   ALT 20 09/11/2023   ANIONGAP 10 09/11/2023   Last thyroid functions Lab Results  Component Value Date   TSH 2.54 09/06/2023   Last vitamin B12 and Folate No results found for: "VITAMINB12", "FOLATE"    The 10-year ASCVD risk score (Arnett DK, et al., 2019) is: 18.6%    Assessment & Plan:   #1 recent vertigo.  Symptoms finally resolved.  Suspect benign peripheral positional vertigo.  CT angiogram head and neck unremarkable.  She was prescribed meclizine by ER physician but had significant fatigue.  Has pending follow-up with ENT.  Be in touch for any recurrent vertigo symptoms or new symptoms  #2 low serum creatinine.  Patient reassured this is not clinically significant.  Suspect related to her Charcot-Marie-Tooth disease and muscle atrophy.  #3 fatigue.  Probably exacerbated by meclizine.  Recent TSH and CBC normal.  Discontinue meclizine at this time   Evelena Peat, MD

## 2023-10-18 ENCOUNTER — Telehealth (INDEPENDENT_AMBULATORY_CARE_PROVIDER_SITE_OTHER): Payer: Self-pay | Admitting: Otolaryngology

## 2023-10-18 NOTE — Telephone Encounter (Signed)
 Confirmed appt & location [id number]

## 2023-10-21 ENCOUNTER — Ambulatory Visit (INDEPENDENT_AMBULATORY_CARE_PROVIDER_SITE_OTHER): Payer: Medicare Other | Admitting: Audiology

## 2023-10-21 ENCOUNTER — Encounter (INDEPENDENT_AMBULATORY_CARE_PROVIDER_SITE_OTHER): Payer: Self-pay

## 2023-10-21 ENCOUNTER — Ambulatory Visit (INDEPENDENT_AMBULATORY_CARE_PROVIDER_SITE_OTHER): Payer: Medicare Other | Admitting: Otolaryngology

## 2023-10-21 VITALS — BP 120/73 | HR 68 | Ht 66.0 in | Wt 190.0 lb

## 2023-10-21 DIAGNOSIS — H903 Sensorineural hearing loss, bilateral: Secondary | ICD-10-CM

## 2023-10-21 DIAGNOSIS — R42 Dizziness and giddiness: Secondary | ICD-10-CM

## 2023-10-21 DIAGNOSIS — H9312 Tinnitus, left ear: Secondary | ICD-10-CM

## 2023-10-21 NOTE — Progress Notes (Unsigned)
 Patient ID: Haley Kennedy, female   DOB: 1946/04/03, 78 y.o.   MRN: [id number]  CC: Recurrent dizziness, left ear tinnitus  HPI:  Haley Kennedy is a 78 y.o. female who presents today for evaluation of her recurrent dizziness and left ear tinnitus.  According to the patient, she started experiencing recurrent dizziness 1 month ago.  She describes her dizziness as a spinning vertigo that lasts for several seconds to a minute.  The dizziness is often triggered with sudden head movement.  She was seen at a [facility] health emergency room.  She was treated with meclizine.  The patient self treated her dizziness with Epley maneuver, with improvement in her dizziness.  The patient had another episode of acute vertigo 25 years ago.  She was seen by an ENT physician at that time.  Her MRI scan was negative.  Her recent head CT and CT angiography were also negative.  Her dizziness at that time lasted for 2 weeks.  The patient is currently wheelchair-bound, secondary to her Charcot-Marie-Tooth syndrome and peripheral neuropathy.  The patient also complains of chronic left ear tinnitus.  The tinnitus is high-pitched and nonpulsatile.  Currently she denies Haley otalgia, otorrhea, or vertigo.  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   Ulcer     Past Surgical History:  Procedure Laterality Date   ABDOMINAL HYSTERECTOMY     65yrs ago   BACK SURGERY     after back surgery had to have iron infusion   BACK SURGERY     2009   CHOLECYSTECTOMY     1997    FOOT SURGERY     1963   FRACTURE SURGERY     KNEE SURGERY     SPINE SURGERY     TONSILLECTOMY      Family History  Problem Relation Age of Onset   Pneumonia Father    Cancer Father        tonsillar cancer    Social History:  reports that she quit smoking about 45 years ago. Her smoking use included cigarettes. She has never used smokeless tobacco. She reports that she does not drink alcohol and does not use drugs.  Allergies:  Allergies  Allergen Reactions   Corticosteroids     CMT Disease   Nsaids    Penicillins Other (See Comments)    Increased body temp   Prednisone     Patient has Charcot-Marie-Tooth Disease and cannot have Haley steroids   Vincristine     CMT disease   Adhesive [Tape] Rash    plastic   Ciprofloxacin Rash   Doxycycline Rash   Phenylephrine-Guaifenesin Rash    Prior to Admission medications   Medication Sig Start Date End Date Taking? Authorizing Provider  Grape Seed 60 MG CAPS Take 60 mg by mouth daily.     [provider]  meclizine (ANTIVERT) 25 MG  tablet Take 0.5-1 tablets (12.5-25 mg total) by mouth 3 (three) times daily as needed for dizziness. 09/11/23   Benjiman Core, MD  Multiple Vitamins-Minerals (ANTIOXIDANT PO) Take by mouth 1 day or 1 dose.    [provider]  Omega-3 Fatty Acids (FISH OIL) 1000 MG CAPS Take by mouth. Take one capsule once daily    [provider]  ondansetron (ZOFRAN-ODT) 4 MG disintegrating tablet Take 1 tablet (4 mg total) by mouth every 8 (eight) hours as needed. 09/11/23   Benjiman Core, MD    There were no vitals taken for this visit. Exam: General: Communicates without difficulty, well nourished, no acute distress. Head: Normocephalic, no evidence injury, no tenderness, facial buttresses intact without stepoff. Face/sinus: No tenderness to palpation and percussion. Facial movement is normal and symmetric. Eyes: PERRL, EOMI. No scleral icterus, conjunctivae clear. Neuro: CN II exam  reveals vision grossly intact.  No nystagmus at Haley point of gaze. Ears: Auricles well formed without lesions.  Ear canals are intact without mass or lesion.  No erythema or edema is appreciated.  The TMs are intact without fluid. Nose: External evaluation reveals normal support and skin without lesions.  Dorsum is intact.  Anterior rhinoscopy reveals congested mucosa over anterior aspect of inferior turbinates and intact septum.  No purulence noted. Oral:  Oral cavity and oropharynx are intact, symmetric, without erythema or edema.  Mucosa is moist without lesions. Neck: Full range of motion without pain.  There is no significant lymphadenopathy.  No masses palpable.  Thyroid bed within normal limits to palpation.  Parotid glands and submandibular glands equal bilaterally without mass.  Trachea is midline. Neuro:  CN 2-12 grossly intact.  The patient is wheelchair-bound.  Vestibular: No nystagmus at Haley point of gaze. Vestibular: There is no nystagmus with pneumatic pressure on either tympanic membrane or Valsalva. The cerebellar examination is unremarkable.    Her hearing test shows bilateral mild high-frequency sensorineural hearing loss.  Assessment: 1.  Recurrent dizziness, likely secondary to benign paroxysmal positional vertigo.  Her dizziness has improved with the Epley maneuver. 2.  Her ear canals, tympanic membranes, and middle ear spaces are normal. 3.  Bilateral mild high-frequency sensorineural hearing loss. 4.  Subjective left ear tinnitus.  Plan: 1.  The physical exam findings and the hearing test results are reviewed with the patient. 2.  The pathophysiology of dizziness and BPPV are extensively discussed with the patient.  Questions are invited and answered. 3.  The strategies to cope with tinnitus, including the use of masker, hearing aids, tinnitus retraining therapy, and avoidance of caffeine and alcohol are discussed.  4.  The patient is encouraged to call with Haley questions or  concerns.  If her dizziness worsens, she may benefit from referral to a physical therapist.  Karn Pickler 10/21/2023, 12:51 PM

## 2023-10-21 NOTE — Progress Notes (Signed)
  [address] [place], [place] [address] [phone]  Audiological Evaluation    Name: Haley Kennedy     DOB:   Dec 31, 1945      MRN:   [id number]                                                                                     Service Date: 10/21/2023     Accompanied by: unaccompanied   Patient comes today after Dr. Suszanne Conners, ENT sent a referral for a hearing evaluation due to concerns with dizziness.   Symptoms Yes Details  Hearing loss  []  Not perceived  Tinnitus  []    Ear pain/ infections/pressure  []    Balance problems  [x]  Spinning sensation, likely lasts minutes or seconds  Noise exposure history  []    Previous ear surgeries  []    Family history of hearing loss  []    Amplification  []    Other  []  Remembers once she was told to have a tumor or growth on her left ear(?).    Otoscopy: Right ear: Clear external ear canals and notable landmarks visualized on the tympanic membrane. Left ear:  Clear external ear canals and notable landmarks visualized on the tympanic membrane.  Tympanometry: Right ear: Type A- Normal external ear canal volume with normal middle ear pressure and tympanic membrane compliance Left ear: Type A- Normal external ear canal volume with normal middle ear pressure and tympanic membrane compliance   Pure tone Audiometry: Both ears- Normal hearing from [phone], then mild presumably  sensorineural hearing loss at 8000 Hz.    Speech Audiometry: Right ear- Speech Reception Threshold (SRT) was obtained at 10 dBHL. Left ear-Speech Reception Threshold (SRT) was obtained at 15 dBHL.   Word Recognition Score Tested using NU-6 (MLV) Right ear: 100% was obtained at a presentation level of 50 dBHL with contralateral masking which is deemed as  excellent. Left ear: 100% was obtained at a presentation level of 50 dBHL with contralateral masking which is deemed as  excellent.   The hearing test results were completed under headphones and  results are deemed to be of good reliability. Test technique:  conventional      Recommendations: Return for a hearing evaluation if concerns with hearing changes arise or per MD recommendation.   [name], AUD

## 2023-10-22 DIAGNOSIS — H9312 Tinnitus, left ear: Secondary | ICD-10-CM | POA: Insufficient documentation

## 2023-10-22 DIAGNOSIS — R42 Dizziness and giddiness: Secondary | ICD-10-CM | POA: Insufficient documentation

## 2023-10-22 DIAGNOSIS — H903 Sensorineural hearing loss, bilateral: Secondary | ICD-10-CM | POA: Insufficient documentation

## 2023-10-24 ENCOUNTER — Encounter: Payer: Self-pay | Admitting: Audiology

## 2023-11-05 ENCOUNTER — Encounter: Payer: Self-pay | Admitting: Family Medicine

## 2023-11-05 ENCOUNTER — Ambulatory Visit (INDEPENDENT_AMBULATORY_CARE_PROVIDER_SITE_OTHER): Admitting: Family Medicine

## 2023-11-05 VITALS — BP 132/78 | HR 61 | Temp 97.4°F | Ht 66.0 in

## 2023-11-05 DIAGNOSIS — R030 Elevated blood-pressure reading, without diagnosis of hypertension: Secondary | ICD-10-CM | POA: Diagnosis not present

## 2023-11-05 NOTE — Progress Notes (Signed)
 Established Patient Office Visit  Subjective   Patient ID: Haley Kennedy, female    DOB: 1945-11-18  Age: 78 y.o. MRN: [id number]  Chief Complaint  Patient presents with   Hypertension    Patient states that Bp has been running higher than normal at home, 136/84 133/75 136/73,    HPI   Haley Kennedy is here with concerns regarding recent marginal elevated blood pressures at home.  Haley Kennedy brings in some home readings including 136/84, 133/75, 136/73.  Haley Kennedy at one point was treated with metoprolol for essential tremor but really has never been treated for hypertension.  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  Haley Kennedy is very conscious regarding diet.  Haley Kennedy keeps her sodium intake down.  Haley Kennedy does hope to lose some weight.  No history of diabetes.  Denies any recent headaches.  Haley Kennedy has had multiple recent labs including CBC, CMP, lipid, TSH back in January and these were all basically stable  Haley Kennedy has been dealing with some stress issues.  Her husband had vascular surgery February 11 has some complications.  He is gradually recovering.   Haley Kennedy does not use any alcohol.  No nonsteroidal use.   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   Ulcer    Past Surgical History:  Procedure Laterality Date   ABDOMINAL HYSTERECTOMY     33yrs ago   BACK SURGERY     after back surgery had to have iron infusion   BACK SURGERY     2009   CHOLECYSTECTOMY     1997   FOOT  SURGERY     1963   FRACTURE SURGERY     KNEE SURGERY     SPINE SURGERY     TONSILLECTOMY      reports that Haley Kennedy quit smoking about 45 years ago. Her smoking use included cigarettes. Haley Kennedy has never used smokeless tobacco. Haley Kennedy reports that Haley Kennedy does not drink alcohol and does not use drugs. family history includes Cancer in her father; Pneumonia in her father. Allergies  Allergen Reactions   Corticosteroids     CMT Disease   Nsaids    Penicillins Other (See Comments)    Increased body temp   Prednisone     Patient has Charcot-Marie-Tooth Disease and cannot have any steroids   Vincristine     CMT disease   Adhesive [Tape] Rash    plastic   Ciprofloxacin Rash   Doxycycline Rash   Phenylephrine-Guaifenesin Rash     Review of Systems  Constitutional:  Negative for malaise/fatigue.  Eyes:  Negative for blurred vision.  Respiratory:  Negative for shortness of breath.   Cardiovascular:  Negative for chest pain.  Neurological:  Negative for dizziness, weakness and headaches.      Objective:     BP 132/78 (BP Location: Left Arm,  Cuff Size: Normal)   Pulse 61   Temp (!) 97.4 F (36.3 C) (Oral)   Ht 5\' 6"  (1.676 m)   SpO2 99%   BMI 30.67 kg/m  BP Readings from Last 3 Encounters:  11/05/23 132/78  10/21/23 120/73  09/17/23 138/70   Wt Readings from Last 3 Encounters:  10/21/23 190 lb (86.2 kg)  09/11/23 190 lb (86.2 kg)  06/17/23 190 lb (86.2 kg)      Physical Exam Vitals reviewed.  Constitutional:      General: Haley Kennedy is not in acute distress.    Appearance: Haley Kennedy is well-developed.  Eyes:     Pupils: Pupils are equal, round, and reactive to light.  Neck:     Thyroid: No thyromegaly.     Vascular: No JVD.  Cardiovascular:     Rate and Rhythm: Normal rate and regular rhythm.     Heart sounds:     No gallop.  Pulmonary:     Effort: Pulmonary effort is normal. No respiratory distress.     Breath sounds: Normal breath sounds. No wheezing or rales.  Musculoskeletal:      Cervical back: Neck supple.  Neurological:     Mental Status: Haley Kennedy is alert.      No results found for any visits on 11/05/23.  Last CBC Lab Results  Component Value Date   WBC 5.3 09/11/2023   HGB 12.6 09/11/2023   HCT 37.0 09/11/2023   MCV 93.6 09/11/2023   MCH 31.2 09/11/2023   RDW 12.8 09/11/2023   PLT 208 09/11/2023   Last metabolic panel Lab Results  Component Value Date   GLUCOSE 86 09/11/2023   NA 141 09/11/2023   K 4.0 09/11/2023   CL 103 09/11/2023   CO2 27 09/11/2023   BUN 9 09/11/2023   CREATININE 0.40 (L) 09/11/2023   GFRNONAA >60 09/11/2023   CALCIUM 9.4 09/11/2023   PROT 7.1 09/11/2023   ALBUMIN 3.8 09/11/2023   BILITOT 0.8 09/11/2023   ALKPHOS 36 (L) 09/11/2023   AST 21 09/11/2023   ALT 20 09/11/2023   ANIONGAP 10 09/11/2023   Last lipids Lab Results  Component Value Date   CHOL 220 (H) 09/06/2023   HDL 70.20 09/06/2023   LDLCALC 133 (H) 09/06/2023   LDLDIRECT 133.5 10/15/2012   TRIG 80.0 09/06/2023   CHOLHDL 3 09/06/2023   Last thyroid functions Lab Results  Component Value Date   TSH 2.54 09/06/2023      The 10-year ASCVD risk score (Arnett DK, et al., 2019) is: 17.2%    Assessment & Plan:   Elevated blood pressures without diagnosis of hypertension.  Initial reading here today 144/82 and repeat left arm seated after rest 132/78.  -We discussed nonpharmacologic management of elevated blood pressure.  Handout on DASH diet given. -Try to keep daily sodium intake less than 2000 mg -Continue regular exercise habits -Work on weight control -Continue home monitoring and be in touch if consistent readings greater than 140 systolic or 90 diastolic.  Evelena Peat, MD

## 2023-11-05 NOTE — Patient Instructions (Signed)
 Monitor blood pressure and be in touch if top number consistently > 140 or bottom number > 90.

## 2024-03-02 DIAGNOSIS — H01013 Ulcerative blepharitis right eye, unspecified eyelid: Secondary | ICD-10-CM | POA: Diagnosis not present

## 2024-03-02 DIAGNOSIS — H524 Presbyopia: Secondary | ICD-10-CM | POA: Diagnosis not present

## 2024-03-09 DIAGNOSIS — H01013 Ulcerative blepharitis right eye, unspecified eyelid: Secondary | ICD-10-CM | POA: Diagnosis not present

## 2024-03-10 ENCOUNTER — Ambulatory Visit (INDEPENDENT_AMBULATORY_CARE_PROVIDER_SITE_OTHER): Admitting: Family Medicine

## 2024-03-10 DIAGNOSIS — Z0000 Encounter for general adult medical examination without abnormal findings: Secondary | ICD-10-CM | POA: Diagnosis not present

## 2024-03-10 NOTE — Progress Notes (Signed)
 PATIENT CHECK-IN and HEALTH RISK ASSESSMENT QUESTIONNAIRE:  -completed by phone/video for upcoming Medicare Preventive Visit   Pre-Visit Check-in: 1)Vitals (height, wt, BP, etc) - record in vitals section for visit on day of visit Request home vitals (wt, BP, etc.) and enter into vitals, THEN update Vital Signs SmartPhrase below at the top of the HPI. See below.  2)Review and Update Medications, Allergies PMH, Surgeries, Social history in Epic 3)Hospitalizations in the last year with date/reason? n  4)Review and Update Care Team (patient's specialists) in Epic 5) Complete PHQ9 in Epic  6) Complete Fall Screening in Epic 7)Review all Health Maintenance Due and order under PCP if not done.  Medicare Wellness Patient Questionnaire:  Answer theses question about your habits: How often do you have a drink containing alcohol?never How many drinks containing alcohol do you have on a typical day when you are drinking?na How often do you have six or more drinks on one occasion?na Have you ever smoked? Quit in 1980 How many packs a day do/did you smoke? na Do you use smokeless tobacco?na Do you use an illicit drugs?na On average, how many days per week do you engage in moderate to strenuous exercise (like a brisk walk)?7, new step On average, how many minutes do you engage in exercise at this level?50 minutes Typical breakfast: doing weight watchers and has cut out carbs and sugars, boiled eggs or high protein cereal Typical lunch/dinner: varies, usually protein and veggies Typical snacks: frozen fruit  Beverages:  unsweet tea Social connection: family/friends > 3 times per week  Answer theses question about your everyday activities: Can you perform most household chores?husband does some Are you deaf or have significant trouble hearing?n Do you feel that you have a problem with memory?n Do you feel safe at home?y Last dentist visit? Has dentist 8. Do you have any difficulty performing  your everyday activities?some, she does dishes and some, husband does some Are you having any difficulty walking, taking medications on your own, and or difficulty managing daily home needs?n Do you have difficulty walking or climbing stairs?husband helps Do you have difficulty dressing or bathing?husband helps Do you have difficulty doing errands alone such as visiting a doctor's office or shopping? Husband helps Do you currently have any difficulty preparing food and eating?n Do you currently have any difficulty using the toilet?n - she is able to transfer Do you have any difficulty managing your finances?n Do you have any difficulties with housekeeping of managing your housekeeping?husband helps   Do you have Advanced Directives in place (Living Will, Healthcare Power or Attorney)? no   Last eye Exam and location?saw her eye doctor yesterday   Do you currently use prescribed or non-prescribed narcotic or opioid pain medications?n  Do you have a history or close family history of breast, ovarian, tubal or peritoneal cancer or a family member with BRCA (breast cancer susceptibility 1 and 2) gene mutations? Tonsil cancer    ----------------------------------------------------------------------------------------------------------------------------------------------------------------------------------------------------------------------  Because this visit was a virtual/telehealth visit, some criteria may be missing or patient reported. Any vitals not documented were not able to be obtained and vitals that have been documented are patient reported.    MEDICARE ANNUAL PREVENTIVE VISIT WITH PROVIDER: (Welcome to Medicare, initial annual wellness or annual wellness exam)  Virtual Visit via Video Note  I connected with Haley Kennedy on 03/10/24 by a video enabled telemedicine application and verified that I am speaking with the correct person using two identifiers.  Location  patient: home Location  provider:work or home office Persons participating in the virtual visit: patient, provider  Concerns and/or follow up today:  Saw a naturopathic doctor recently - is taking some magnesium  citrate 100mg , magnesium  blend 200mg , potassium 99mg . In a wheelchair but can transfer with her wheelchair elevator.  See HM section in Epic for other details of completed HM.    ROS: negative for report of fevers, unintentional weight loss, vision changes, vision loss, hearing loss or change, chest pain, sob, hemoptysis, melena, hematochezia, hematuria, falls, bleeding or bruising, thoughts of suicide or self harm, memory loss  Patient-completed extensive health risk assessment - reviewed and discussed with the patient: See Health Risk Assessment completed with patient prior to the visit either above or in recent phone note. This was reviewed in detailed with the patient today and appropriate recommendations, orders and referrals were placed as needed per Summary below and patient instructions.   Review of Medical History: -PMH, PSH, Family History and current specialty and care providers reviewed and updated and listed below   Patient Care Team: Micheal Wolm ORN, MD as PCP - General (Family Medicine)   Past Medical History:  Diagnosis Date   Allergy    Arthritis    low back and neck   Asthma    as a child   Chronic back pain    CMT (Charcot-Marie-Tooth disease)    Diverticulosis    Gastric ulcer    Hyperlipidemia    Hyperlipidemia    Peripheral neuropathy    Rotator cuff (capsule) sprain 07/09/2011   Shortness of breath    with exertion;pt states its bc shes not in shape    Swelling    from knee down;takes furosemide  daily   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   Ulcer     Past Surgical History:  Procedure Laterality Date   ABDOMINAL HYSTERECTOMY     35yrs ago   BACK SURGERY     after back surgery had to have iron infusion   BACK  SURGERY     2009   CHOLECYSTECTOMY     1997   FOOT SURGERY     1963   FRACTURE SURGERY     KNEE SURGERY     SPINE SURGERY     TONSILLECTOMY      Social History   Socioeconomic History   Marital status: Married    Spouse name: Not on file   Number of children: Not on file   Years of education: Not on file   Highest education level: Professional school degree (e.g., MD, DDS, DVM, JD)  Occupational History   Not on file  Tobacco Use   Smoking status: Former    Current packs/day: 0.00    Types: Cigarettes    Quit date: 08/27/1978    Years since quitting: 45.5   Smokeless tobacco: Never  Vaping Use   Vaping status: Never Used  Substance and Sexual Activity   Alcohol use: No   Drug use: No   Sexual activity: Never  Other Topics Concern   Not on file  Social History Narrative   Not on file   Social Drivers of Health   Financial Resource Strain: Low Risk  (03/10/2024)   Overall Financial Resource Strain (CARDIA)    Difficulty of Paying Living Expenses: Not hard at all  Food Insecurity: No Food Insecurity (03/10/2024)   Hunger Vital Sign    Worried About Running Out of Food in the Last Year: Never true  Ran Out of Food in the Last Year: Never true  Transportation Needs: No Transportation Needs (03/10/2024)   PRAPARE - Administrator, Civil Service (Medical): No    Lack of Transportation (Non-Medical): No  Physical Activity: Sufficiently Active (03/10/2024)   Exercise Vital Sign    Days of Exercise per Week: 7 days    Minutes of Exercise per Session: 50 min  Stress: No Stress Concern Present (03/10/2024)   [employer] of Occupational Health - Occupational Stress Questionnaire    Feeling of Stress: Only a little  Social Connections: Socially Integrated (03/10/2024)   Social Connection and Isolation Panel    Frequency of Communication with Friends and Family: More than three times a week    Frequency of Social Gatherings with Friends and Family: Twice a  week    Attends Religious Services: More than 4 times per year    Active Member of [employer] or Organizations: Yes    Attends Engineer, structural: More than 4 times per year    Marital Status: Married  Catering manager Violence: Not At Risk (10/05/2022)   Humiliation, Afraid, Rape, and Kick questionnaire    Fear of Current or Ex-Partner: No    Emotionally Abused: No    Physically Abused: No    Sexually Abused: No    Family History  Problem Relation Age of Onset   Pneumonia Father    Cancer Father        tonsillar cancer    Current Outpatient Medications on File Prior to Visit  Medication Sig Dispense Refill   Magnesium  Citrate 100 MG CAPS Take by mouth.     Potassium Gluconate 80 MG TABS Take by mouth.     Grape Seed 60 MG CAPS Take 60 mg by mouth daily.      Multiple Vitamins-Minerals (ANTIOXIDANT PO) Take by mouth 1 day or 1 dose.     Omega-3 Fatty Acids (FISH OIL) 1000 MG CAPS Take by mouth. Take one capsule once daily     No current facility-administered medications on file prior to visit.    Allergies  Allergen Reactions   Corticosteroids     CMT Disease   Nsaids    Penicillins Other (See Comments)    Increased body temp   Prednisone     Patient has Charcot-Marie-Tooth Disease and cannot have any steroids   Vincristine     CMT disease   Adhesive [Tape] Rash    plastic   Ciprofloxacin Rash   Doxycycline  Rash   Phenylephrine-Guaifenesin  Rash       Physical Exam Vitals requested from patient and listed below if patient had equipment and was able to obtain at home for this virtual visit: There were no vitals filed for this visit. Estimated body mass index is 30.67 kg/m as calculated from the following:   Height as of 11/05/23: 5' 6 (1.676 m).   Weight as of 10/21/23: 190 lb (86.2 kg).  EKG (optional): deferred due to virtual visit  GENERAL: alert, oriented, no acute distress detected, full vision exam deferred due to pandemic and/or virtual  encounter  HEENT: atraumatic, conjunttiva clear, no obvious abnormalities on inspection of external nose and ears  NECK: normal movements of the head and neck  LUNGS: on inspection no signs of respiratory distress, breathing rate appears normal, no obvious gross SOB, gasping or wheezing  CV: no obvious cyanosis  PSYCH/NEURO: pleasant and cooperative, no obvious depression or anxiety, speech and thought processing grossly intact, Cognitive function grossly  intact  Flowsheet Row Office Visit from 11/05/2023 in [facility] [place]  PHQ-9 Total Score 3        03/10/2024   12:13 PM 11/05/2023    2:25 PM 09/17/2023    1:12 PM 08/16/2023    2:42 PM 04/02/2023    2:43 PM  Depression screen PHQ 2/9  Decreased Interest 0 0 0 0 0  Down, Depressed, Hopeless 0  0 0 0  PHQ - 2 Score 0 0 0 0 0  Altered sleeping  2 1 2  0  Tired, decreased energy  0 1 0 0  Change in appetite  1 0 0 0  Feeling bad or failure about yourself   0 0 0 0  Trouble concentrating  0 0 0 0  Moving slowly or fidgety/restless  0 0 0 0  Suicidal thoughts  0 0 0 0  PHQ-9 Score  3 2 2  0  Difficult doing work/chores  Not difficult at all  Not difficult at all Not difficult at all       05/04/2022    9:30 AM 10/05/2022   12:50 PM 09/17/2023   11:31 AM 11/05/2023    2:25 PM 03/10/2024   12:07 PM  Fall Risk  Falls in the past year? 0 0 Exclusion - non ambulatory 0 0  Was there an injury with Fall? 0 0  0 0  Fall Risk Category Calculator 0 0  0 0  Fall Risk Category (Retired) Low       (RETIRED) Patient Fall Risk Level Low fall risk       Patient at Risk for Falls Due to No Fall Risks No Fall Risks     Fall risk Follow up Falls evaluation completed  Falls prevention discussed        Data saved with a previous flowsheet row definition     SUMMARY AND PLAN:  Encounter for Medicare annual wellness exam   Discussed applicable health maintenance/preventive health measures and advised and referred or  ordered per patient preferences: -discussed vaccines due recs/risks -declines bone density -she is considering mammogram and agrees to schedule if wishes -she has cologuard kit - advised to complete   Health Maintenance  Topic Date Due   DTaP/Tdap/Td (2 - Td or Tdap) 10/15/2022   Fecal DNA (Cologuard)  04/06/2023   Zoster Vaccines- Shingrix (1 of 2) 08/07/2034 (Originally 05/02/1998)   INFLUENZA VACCINE  03/27/2024   Medicare Annual Wellness (AWV)  03/10/2025   Pneumococcal Vaccine: 50+ Years  Completed   DEXA SCAN  Completed   Hepatitis C Screening  Completed   Hepatitis B Vaccines  Aged Out   HPV VACCINES  Aged Out   Meningococcal B Vaccine  Aged Out   Colonoscopy  Discontinued   COVID-19 Vaccine  Discontinued      Education and counseling on the following was provided based on the above review of health and a plan/checklist for the patient, along with additional information discussed, was provided for the patient in the patient instructions :  -Advised on importance of completing advanced directives, discussed options for completing and provided information in patient instructions as well -Advised and counseled on a healthy lifestyle - including the importance of a healthy diet, regular physical activity -Reviewed patient's current diet. Advised and counseled on a whole foods based healthy diet. A summary of a healthy diet was provided in the Patient Instructions.  -reviewed patient's current physical activity level and congratulated on her regular exercise! Further  resources provided in patient instructions.  -Advise yearly dental visits at minimum and regular eye exams   Follow up: see patient instructions     Patient Instructions  I really enjoyed getting to talk with you today! I am available on Tuesdays and Thursdays for virtual visits if you have any questions or concerns, or if I can be of any further assistance.   CHECKLIST FROM ANNUAL WELLNESS VISIT:  -Follow up  (please call to schedule if not scheduled after visit):   -yearly for annual wellness visit with primary care office  Here is a list of your preventive care/health maintenance measures and the plan for each if any are due:  PLAN For any measures below that may be due:    1. Complete and mail the cologuard test. If expired please call the company.   2. Please call to schedule your mammogram.   3. Can get vaccines at the pharmacy. Let us  know if you do so that we can update your record.   Health Maintenance  Topic Date Due   DTaP/Tdap/Td (2 - Td or Tdap) 10/15/2022   Fecal DNA (Cologuard)  04/06/2023   Medicare Annual Wellness (AWV)  10/06/2023   Zoster Vaccines- Shingrix (1 of 2) 08/07/2034 (Originally 05/02/1998)   INFLUENZA VACCINE  03/27/2024   Pneumococcal Vaccine: 50+ Years  Completed   DEXA SCAN  Completed   Hepatitis C Screening  Completed   Hepatitis B Vaccines  Aged Out   HPV VACCINES  Aged Out   Meningococcal B Vaccine  Aged Out   Colonoscopy  Discontinued   COVID-19 Vaccine  Discontinued    -See a dentist at least yearly  -Get your eyes checked and then per your eye specialist's recommendations  -Other issues addressed today:   -I have included below further information regarding a healthy whole foods based diet, physical activity guidelines for adults, stress management and opportunities for social connections. I hope you find this information useful.   -----------------------------------------------------------------------------------------------------------------------------------------------------------------------------------------------------------------------------------------------------------    NUTRITION: -eat real food: lots of colorful vegetables (half the plate) and fruits -5-7 servings of vegetables and fruits per day (fresh or steamed is best), exp. 2 servings of vegetables with lunch and dinner and 2 servings of fruit per day. Berries and greens  such as kale and collards are great choices.  -consume on a regular basis:  fresh fruits, fresh veggies, fish, nuts, seeds, healthy oils (such as olive oil, avocado oil), whole grains (make sure for bread/pasta/crackers/etc., that the first ingredient on label contains the word whole), legumes. -can eat small amounts of dairy and lean meat (no larger than the palm of your hand), but avoid processed meats such as ham, bacon, lunch meat, etc. -drink water -try to avoid fast food and pre-packaged foods, processed meat, ultra processed foods/beverages (donuts, candy, etc.) -most experts advise limiting sodium to < 2300mg  per day, should limit further is any chronic conditions such as high blood pressure, heart disease, diabetes, etc. The American Heart Association advised that < 1500mg  is is ideal -try to avoid foods/beverages that contain any ingredients with names you do not recognize  -try to avoid foods/beverages  with added sugar or sweeteners/sweets  -try to avoid sweet drinks (including diet drinks): soda, juice, Gatorade, sweet tea, power drinks, diet drinks -try to avoid white rice, white bread, pasta (unless whole grain)  EXERCISE GUIDELINES FOR ADULTS: -if you wish to increase your physical activity, do so gradually and with the approval of your doctor -STOP and seek medical  care immediately if you have any chest pain, chest discomfort or trouble breathing when starting or increasing exercise  -move and stretch your body, legs, feet and arms when sitting for long periods -Physical activity guidelines for optimal health in adults: -get at least 150 minutes per week of moderate exercise (can talk, but not sing); this is about 20-30 minutes of sustained activity 5-7 days per week or two 10-15 minute episodes of sustained activity 5-7 days per week -do some muscle building/resistance training/strength training at least 2 days per week  -balance exercises 3+ days per week:   Stand somewhere  where you have something sturdy to hold onto if you lose balance    1) lift up on toes, then back down, start with 5x per day and work up to 20x   2) stand and lift one leg straight out to the side so that foot is a few inches of the floor, start with 5x each side and work up to 20x each side   3) stand on one foot, start with 5 seconds each side and work up to 20 seconds on each side  If you need ideas or help with getting more active:  -Silver sneakers https://tools.silversneakers.com  -Walk with a Doc: http://www.duncan-williams.com/  -try to include resistance (weight lifting/strength building) and balance exercises twice per week: or the following link for ideas: http://castillo-powell.com/  BuyDucts.dk  STRESS MANAGEMENT: -can try meditating, or just sitting quietly with deep breathing while intentionally relaxing all parts of your body for 5 minutes daily -if you need further help with stress, anxiety or depression please follow up with your primary doctor or contact the wonderful folks at [facility]: [phone]  SOCIAL CONNECTIONS: -options in [place] if you wish to engage in more social and exercise related activities:  -Silver sneakers https://tools.silversneakers.com  -Walk with a Doc: http://www.duncan-williams.com/  -Check out the [facility] Active Adults 50+ section on the [place] [employer] (hiking clubs, book clubs, cards and games, chess, exercise classes, aquatic classes and much more) - see the website for details: https://www.[place].gov/departments/parks-recreation/active-adults50  -YouTube has lots of exercise videos for different ages and abilities as well  -[facility] (a variety of indoor and outdoor inperson activities for adults). [phone]. [address]  -Virtual Online Classes (a variety of topics): see  seniorplanet.org or call ([phone]  -consider volunteering at a school, hospice center, church, senior center or elsewhere     ADVANCED HEALTHCARE DIRECTIVES:  [place] Advanced Directives assistance:   ExpressWeek.com.cy  Everyone should have advanced health care directives in place. This is so that you get the care you want, should you ever be in a situation where you are unable to make your own medical decisions.   From the [place] Advanced Directive Website: Advance Health Care Directives are legal documents in which you give written instructions about your health care if, in the future, you cannot speak for yourself.   A health care power of attorney allows you to name a person you trust to make your health care decisions if you cannot make them yourself. A declaration of a desire for a natural death (or living will) is document, which states that you desire not to have your life prolonged by extraordinary measures if you have a terminal or incurable illness or if you are in a vegetative state. An advance instruction for mental health treatment makes a declaration of instructions, information and preferences regarding your mental health treatment. It also states that you are aware  that the advance instruction authorizes a mental health treatment provider to act according to your wishes. It may also outline your consent or refusal of mental health treatment. A declaration of an anatomical gift allows anyone over the age of 32 to make a gift by will, organ donor card or other document.   Please see the following website or an elder law attorney for forms, FAQs and for completion of advanced directives: [place]  Print production planner Health Care Directives Advance Health Care Directives (http://guzman.com/)  Or copy and paste the following to your web browser: [email or web address]         Chiquita JONELLE Cramp, DO

## 2024-03-10 NOTE — Patient Instructions (Addendum)
 I really enjoyed getting to talk with you today! I am available on Tuesdays and Thursdays for virtual visits if you have any questions or concerns, or if I can be of any further assistance.   CHECKLIST FROM ANNUAL WELLNESS VISIT:  -Follow up (please call to schedule if not scheduled after visit):   -yearly for annual wellness visit with primary care office  Here is a list of your preventive care/health maintenance measures and the plan for each if any are due:  PLAN For any measures below that may be due:    1. Complete and mail the cologuard test. If expired please call the company.   2. Please call to schedule your mammogram.   3. Can get vaccines at the pharmacy. Let us  know if you do so that we can update your record.   Health Maintenance  Topic Date Due   DTaP/Tdap/Td (2 - Td or Tdap) 10/15/2022   Fecal DNA (Cologuard)  04/06/2023   Medicare Annual Wellness (AWV)  10/06/2023   Zoster Vaccines- Shingrix (1 of 2) 08/07/2034 (Originally 05/02/1998)   INFLUENZA VACCINE  03/27/2024   Pneumococcal Vaccine: 50+ Years  Completed   DEXA SCAN  Completed   Hepatitis C Screening  Completed   Hepatitis B Vaccines  Aged Out   HPV VACCINES  Aged Out   Meningococcal B Vaccine  Aged Out   Colonoscopy  Discontinued   COVID-19 Vaccine  Discontinued    -See a dentist at least yearly  -Get your eyes checked and then per your eye specialist's recommendations  -Other issues addressed today:   -I have included below further information regarding a healthy whole foods based diet, physical activity guidelines for adults, stress management and opportunities for social connections. I hope you find this information useful.   -----------------------------------------------------------------------------------------------------------------------------------------------------------------------------------------------------------------------------------------------------------    NUTRITION: -eat  real food: lots of colorful vegetables (half the plate) and fruits -5-7 servings of vegetables and fruits per day (fresh or steamed is best), exp. 2 servings of vegetables with lunch and dinner and 2 servings of fruit per day. Berries and greens such as kale and collards are great choices.  -consume on a regular basis:  fresh fruits, fresh veggies, fish, nuts, seeds, healthy oils (such as olive oil, avocado oil), whole grains (make sure for bread/pasta/crackers/etc., that the first ingredient on label contains the word whole), legumes. -can eat small amounts of dairy and lean meat (no larger than the palm of your hand), but avoid processed meats such as ham, bacon, lunch meat, etc. -drink water -try to avoid fast food and pre-packaged foods, processed meat, ultra processed foods/beverages (donuts, candy, etc.) -most experts advise limiting sodium to < 2300mg  per day, should limit further is any chronic conditions such as high blood pressure, heart disease, diabetes, etc. The American Heart Association advised that < 1500mg  is is ideal -try to avoid foods/beverages that contain any ingredients with names you do not recognize  -try to avoid foods/beverages  with added sugar or sweeteners/sweets  -try to avoid sweet drinks (including diet drinks): soda, juice, Gatorade, sweet tea, power drinks, diet drinks -try to avoid white rice, white bread, pasta (unless whole grain)  EXERCISE GUIDELINES FOR ADULTS: -if you wish to increase your physical activity, do so gradually and with the approval of your doctor -STOP and seek medical care immediately if you have any chest pain, chest discomfort or trouble breathing when starting or increasing exercise  -move and stretch your body, legs, feet and arms when sitting for  long periods -Physical activity guidelines for optimal health in adults: -get at least 150 minutes per week of moderate exercise (can talk, but not sing); this is about 20-30 minutes of  sustained activity 5-7 days per week or two 10-15 minute episodes of sustained activity 5-7 days per week -do some muscle building/resistance training/strength training at least 2 days per week  -balance exercises 3+ days per week:   Stand somewhere where you have something sturdy to hold onto if you lose balance    1) lift up on toes, then back down, start with 5x per day and work up to 20x   2) stand and lift one leg straight out to the side so that foot is a few inches of the floor, start with 5x each side and work up to 20x each side   3) stand on one foot, start with 5 seconds each side and work up to 20 seconds on each side  If you need ideas or help with getting more active:  -Silver sneakers https://tools.silversneakers.com  -Walk with a Doc: http://www.duncan-williams.com/  -try to include resistance (weight lifting/strength building) and balance exercises twice per week: or the following link for ideas: http://castillo-powell.com/  BuyDucts.dk  STRESS MANAGEMENT: -can try meditating, or just sitting quietly with deep breathing while intentionally relaxing all parts of your body for 5 minutes daily -if you need further help with stress, anxiety or depression please follow up with your primary doctor or contact the wonderful folks at [facility]: [phone]  SOCIAL CONNECTIONS: -options in [place] if you wish to engage in more social and exercise related activities:  -Silver sneakers https://tools.silversneakers.com  -Walk with a Doc: http://www.duncan-williams.com/  -Check out the [facility] Active Adults 50+ section on the [place] of [employer] (hiking clubs, book clubs, cards and games, chess, exercise classes, aquatic classes and much more) - see the website for details: https://www.[place].gov/departments/parks-recreation/active-adults50  -YouTube has lots of  exercise videos for different ages and abilities as well  -[facility] (a variety of indoor and outdoor inperson activities for adults). (986)713-3435. [address]  -Virtual Online Classes (a variety of topics): see seniorplanet.org or call [phone]  -consider volunteering at a school, hospice center, church, senior center or elsewhere     ADVANCED HEALTHCARE DIRECTIVES:  [place] Advanced Directives assistance:   ExpressWeek.com.cy  Everyone should have advanced health care directives in place. This is so that you get the care you want, should you ever be in a situation where you are unable to make your own medical decisions.   From the [place] Advanced Directive Website: Advance Health Care Directives are legal documents in which you give written instructions about your health care if, in the future, you cannot speak for yourself.   A health care power of attorney allows you to name a person you trust to make your health care decisions if you cannot make them yourself. A declaration of a desire for a natural death (or living will) is document, which states that you desire not to have your life prolonged by extraordinary measures if you have a terminal or incurable illness or if you are in a vegetative state. An advance instruction for mental health treatment makes a declaration of instructions, information and preferences regarding your mental health treatment. It also states that you are aware that the advance instruction authorizes a mental health treatment provider to act according to your wishes. It may also outline your consent or refusal of mental health treatment. A declaration of  an anatomical gift allows anyone over the age of 18 to make a gift by will, organ donor card or other document.   Please see the following website or an elder law attorney for forms, FAQs and for completion of advanced  directives: [place]  Print production planner Health Care Directives Advance Health Care Directives ([email or web address]/)  Or copy and paste the following to your web browser: [email or web address]

## 2024-03-16 DIAGNOSIS — D1801 Hemangioma of skin and subcutaneous tissue: Secondary | ICD-10-CM | POA: Diagnosis not present

## 2024-03-16 DIAGNOSIS — L538 Other specified erythematous conditions: Secondary | ICD-10-CM | POA: Diagnosis not present

## 2024-03-16 DIAGNOSIS — L820 Inflamed seborrheic keratosis: Secondary | ICD-10-CM | POA: Diagnosis not present

## 2024-03-16 DIAGNOSIS — K130 Diseases of lips: Secondary | ICD-10-CM | POA: Diagnosis not present

## 2024-04-04 DIAGNOSIS — N390 Urinary tract infection, site not specified: Secondary | ICD-10-CM | POA: Diagnosis not present

## 2024-04-14 ENCOUNTER — Ambulatory Visit (INDEPENDENT_AMBULATORY_CARE_PROVIDER_SITE_OTHER): Admitting: Family Medicine

## 2024-04-14 ENCOUNTER — Encounter: Payer: Self-pay | Admitting: Family Medicine

## 2024-04-14 VITALS — BP 114/66 | HR 70 | Temp 98.2°F

## 2024-04-14 DIAGNOSIS — G4700 Insomnia, unspecified: Secondary | ICD-10-CM | POA: Diagnosis not present

## 2024-04-14 DIAGNOSIS — L270 Generalized skin eruption due to drugs and medicaments taken internally: Secondary | ICD-10-CM | POA: Diagnosis not present

## 2024-04-14 DIAGNOSIS — K5900 Constipation, unspecified: Secondary | ICD-10-CM

## 2024-04-14 NOTE — Progress Notes (Signed)
 Established Patient Office Visit  Subjective   Patient ID: Haley Kennedy, female    DOB: 10-30-1945  Age: 78 y.o. MRN: [id number]  Chief Complaint  Patient presents with   Rash   Constipation    HPI   Haley Kennedy has history of Charcot-Marie-Tooth disease, essential tremor, chronic peripheral neuropathy.  She is seen today for several items as follows  She has skin rash which first came about 3 days ago involving her anterior chest, groin, thighs, and stomach region.  Erythematous and blanches with pressure and slightly pruritic.  No fever.  She states she developed UTI type symptoms a week ago Saturday went to local urgent care and was placed on Septra DS and was on her last day of Septra when the rash broke out.  She suspects this was probably allergic.  No oropharyngeal lesions.  She has multiple other drug allergies.  She took Benadryl  but had some increased sedation  She has had some recent increased constipation issues and is struggled with this to some extent for years.  Takes Colace but nominal benefit.  She had Cologuard 2021 and this was ordered in 2024 but apparently not completed.  She also relates some recent insomnia issues.  Frequently wakes up early morning and cannot get back to sleep.  She tries to avoid computers and bright lights at night.  No daytime napping.  No late day use of caffeine.  No alcohol use.  Past Medical History:  Diagnosis Date   Allergy    Arthritis    low back and neck   Asthma    as a child   Chronic back pain    CMT (Charcot-Marie-Tooth disease)    Diverticulosis    Gastric ulcer    Hyperlipidemia    Hyperlipidemia    Peripheral neuropathy    Rotator cuff (capsule) sprain 07/09/2011   Shortness of breath    with exertion;pt states its bc shes not in shape    Swelling    from knee down;takes furosemide  daily   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   Ulcer    Past Surgical History:  Procedure Laterality Date    ABDOMINAL HYSTERECTOMY     95yrs ago   BACK SURGERY     after back surgery had to have iron infusion   BACK SURGERY     2009   CHOLECYSTECTOMY     1997   FOOT SURGERY     1963   FRACTURE SURGERY     KNEE SURGERY     SPINE SURGERY     TONSILLECTOMY      reports that she quit smoking about 45 years ago. Her smoking use included cigarettes. She has never used smokeless tobacco. She reports that she does not drink alcohol and does not use drugs. family history includes Cancer in her father; Pneumonia in her father. Allergies  Allergen Reactions   Corticosteroids     CMT Disease   Nsaids    Penicillins Other (See Comments)    Increased body temp   Prednisone     Patient has Charcot-Marie-Tooth Disease and cannot have any steroids   Vincristine     CMT disease   Adhesive [Tape] Rash    plastic   Ciprofloxacin Rash   Doxycycline  Rash   Phenylephrine-Guaifenesin  Rash   Septra [Sulfamethoxazole-Trimethoprim] Rash    Review of Systems  Constitutional:  Negative for chills and fever.  Respiratory:  Negative for cough and shortness  of breath.   Cardiovascular:  Negative for chest pain.  Gastrointestinal:  Positive for constipation and heartburn. Negative for abdominal pain, diarrhea, nausea and vomiting.  Skin:  Positive for rash.  Psychiatric/Behavioral:  The patient has insomnia.       Objective:     BP 114/66   Pulse 70   Temp 98.2 F (36.8 C) (Oral)   SpO2 96%  BP Readings from Last 3 Encounters:  04/14/24 114/66  11/05/23 132/78  10/21/23 120/73   Wt Readings from Last 3 Encounters:  10/21/23 190 lb (86.2 kg)  09/11/23 190 lb (86.2 kg)  06/17/23 190 lb (86.2 kg)      Physical Exam Vitals reviewed.  Constitutional:      General: She is not in acute distress.    Appearance: She is not ill-appearing.  Cardiovascular:     Rate and Rhythm: Normal rate and regular rhythm.  Pulmonary:     Effort: Pulmonary effort is normal.     Breath sounds: Normal  breath sounds. No wheezing or rales.  Skin:    Findings: Rash present.     Comments: She has maculopapular rash predominantly upper anterior chest which does blanch with pressure.  Nonscaly.  Neurological:     Mental Status: She is alert.      No results found for any visits on 04/14/24.    The 10-year ASCVD risk score (Arnett DK, et al., 2019) is: 13.2%    Assessment & Plan:   #1 skin rash.  Suspect probably allergic rash related to Septra.  We placed Septra/sulfa on her allergy list.  Recommend nonsedating antihistamine such as Claritin, Allegra, or Zyrtec.  May add Pepcid 20 mg twice daily.  She is aware that this may take several days to resolve  #2 chronic constipation.  Discussed importance of adequate fluid, adequate fiber, consider MiraLAX  17 g once daily as needed.  Be in touch if she is not seeing adequate relief with MiraLAX   #3 intermittent insomnia.  Discussed sleep hygiene with handout given.  She has taken Benadryl  in the past which helps but she tries to avoid regular use.  Discussed nonpharmacologic suggestions for trying to improve sleep hygiene   No follow-ups on file.    Wolm Scarlet, MD

## 2024-04-14 NOTE — Patient Instructions (Addendum)
 Suspect allergic rash- possibly secondary.to Septra  Can try over the counter Pepcid 20 mg twice daily.  Could also try Claritan, Zyrtec, or Allegra.   For future UTI could use Macrobid  or Keflex .   Consider Miralax  as needed for constipation.

## 2024-05-12 DIAGNOSIS — M21371 Foot drop, right foot: Secondary | ICD-10-CM | POA: Diagnosis not present

## 2024-05-12 DIAGNOSIS — R29898 Other symptoms and signs involving the musculoskeletal system: Secondary | ICD-10-CM | POA: Diagnosis not present

## 2024-05-12 DIAGNOSIS — M21372 Foot drop, left foot: Secondary | ICD-10-CM | POA: Diagnosis not present

## 2024-05-28 DIAGNOSIS — M79674 Pain in right toe(s): Secondary | ICD-10-CM | POA: Diagnosis not present

## 2024-05-28 DIAGNOSIS — L03031 Cellulitis of right toe: Secondary | ICD-10-CM | POA: Diagnosis not present

## 2024-06-11 DIAGNOSIS — M79674 Pain in right toe(s): Secondary | ICD-10-CM | POA: Diagnosis not present

## 2024-06-11 DIAGNOSIS — L03031 Cellulitis of right toe: Secondary | ICD-10-CM | POA: Diagnosis not present

## 2024-06-17 DIAGNOSIS — H26493 Other secondary cataract, bilateral: Secondary | ICD-10-CM | POA: Diagnosis not present

## 2024-06-17 DIAGNOSIS — Z961 Presence of intraocular lens: Secondary | ICD-10-CM | POA: Diagnosis not present

## 2024-09-04 ENCOUNTER — Ambulatory Visit: Admitting: Family Medicine

## 2024-09-04 ENCOUNTER — Encounter: Payer: Self-pay | Admitting: Family Medicine

## 2024-09-04 VITALS — BP 130/64 | HR 71 | Temp 98.0°F

## 2024-09-04 DIAGNOSIS — H5789 Other specified disorders of eye and adnexa: Secondary | ICD-10-CM | POA: Diagnosis not present

## 2024-09-04 DIAGNOSIS — H9313 Tinnitus, bilateral: Secondary | ICD-10-CM | POA: Diagnosis not present

## 2024-09-04 DIAGNOSIS — G609 Hereditary and idiopathic neuropathy, unspecified: Secondary | ICD-10-CM

## 2024-09-04 NOTE — Progress Notes (Signed)
 "  Established Patient Office Visit  Subjective   Patient ID: Haley Kennedy, female    DOB: [birth year]  Age: 79 y.o. MRN: [id number]  Chief Complaint  Patient presents with   Tinnitus    HPI    Athina has history of Charcot-Marie-Tooth disease and associated peripheral neuropathy.  She recently established with CMT specialist down in [facility].  They had suggested pregabalin 50 mg nightly for her neuropathy symptoms but she declined taking this for fear of side effects.  She does have frequent burning in her legs at night.  Ezelle is very health-conscious and exercises regularly.  She is able to do exercise type bike and exercises several minutes per day.  She has had some recent issues with bilateral tinnitus.  Has seen audiologist previously and states her hearing was fine .  Denies any recent acute hearing changes.  No pulsatile tinnitus.  No vertigo recently.  She states she ate some purple TicTac's around Christmas and that is when her tinnitus started.  She has tried a couple over-the-counter medications which have not helped.  Symptoms are relatively mild.  No aspirin use.  She has had some bilateral eye irritation with frequent drainage from both eyes.  She has seen another provider and was prescribed TobraDex.  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  Past Medical History:  Diagnosis Date   Allergy    Arthritis    low back and neck   Asthma    as a child   Chronic back pain    CMT (Charcot-Marie-Tooth disease)    Diverticulosis    Gastric ulcer    Hyperlipidemia    Hyperlipidemia    Peripheral neuropathy    Rotator cuff (capsule) sprain 07/09/2011   Shortness of breath    with exertion;pt states its bc shes not in shape    Swelling    from knee down;takes furosemide  daily   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   Ulcer    Past Surgical History:  Procedure Laterality Date   ABDOMINAL HYSTERECTOMY     80yrs ago   BACK SURGERY      after back surgery had to have iron infusion   BACK SURGERY     2009   CHOLECYSTECTOMY     1997   FOOT SURGERY     1963   FRACTURE SURGERY     KNEE SURGERY     SPINE SURGERY     TONSILLECTOMY      reports that she quit smoking about 46 years ago. Her smoking use included cigarettes. She has never used smokeless tobacco. She reports that she does not drink alcohol and does not use drugs. family history includes Cancer in her father; Pneumonia in her father. Allergies[1]  Review of Systems  Constitutional:  Negative for fever and malaise/fatigue.  HENT:  Positive for tinnitus. Negative for congestion, ear discharge, ear pain and hearing loss.   Eyes:  Positive for redness. Negative for blurred vision.  Respiratory:  Negative for shortness of breath.   Cardiovascular:  Negative for chest pain and palpitations.  Genitourinary:  Negative for dysuria.  Neurological:  Negative for dizziness, weakness and headaches.      Objective:     BP 130/64   Pulse 71   Temp 98 F (36.7 C) (Oral)   SpO2 96%  BP Readings from Last 3 Encounters:  09/04/24 130/64  04/14/24 114/66  11/05/23 132/78   Wt Readings  from Last 3 Encounters:  10/21/23 190 lb (86.2 kg)  09/11/23 190 lb (86.2 kg)  06/17/23 190 lb (86.2 kg)      Physical Exam Vitals reviewed.  Constitutional:      General: She is not in acute distress.    Appearance: She is not ill-appearing.  HENT:     Right Ear: Tympanic membrane normal.     Left Ear: Tympanic membrane normal.  Eyes:     Comments: Conjunctive appear normal.  She has very mild erythema lateral canthus bilaterally  Cardiovascular:     Rate and Rhythm: Normal rate and regular rhythm.  Pulmonary:     Effort: Pulmonary effort is normal.     Breath sounds: Normal breath sounds. No wheezing or rales.  Musculoskeletal:     Comments: She has significant muscle atrophy especially involving her hands with interosseous muscles consistent with her  Charcot-Marie-Tooth disease  Neurological:     General: No focal deficit present.     Mental Status: She is alert.     Cranial Nerves: No cranial nerve deficit.      No results found for any visits on 09/04/24.    The 10-year ASCVD risk score (Arnett DK, et al., 2019) is: 18.6%    Assessment & Plan:   #1 peripheral neuropathy related to Charcot-Marie-Tooth disease.  Patient has seen CMT specialist and prescribed pregabalin but never started this for fear of side effects.  We explained that there would be very similar side effect profile with gabapentin.  Would not recommend anticholinergic medication such as amitriptyline secondary to side effect risk.  #2 bilateral tinnitus.  This is nonpulsatile.  No cerumen.  No associated symptoms such as vertigo or acute hearing loss.  Offered referral to audiologist and she declines at this time.  #3 eye irritation.  She has seen another specialist and was told she had blocked tear ducts .  She has scheduled follow-up with specialist to further evaluate.  Wolm Scarlet, MD     [1]  Allergies Allergen Reactions   Corticosteroids     CMT Disease   Nsaids    Penicillins Other (See Comments)    Increased body temp   Prednisone     Patient has Charcot-Marie-Tooth Disease and cannot have any steroids   Vincristine     CMT disease   Adhesive [Tape] Rash    plastic   Ciprofloxacin Rash   Doxycycline  Rash   Phenylephrine-Guaifenesin  Rash   Septra [Sulfamethoxazole-Trimethoprim] Rash   "

## 2024-09-14 ENCOUNTER — Emergency Department (HOSPITAL_COMMUNITY)
Admission: EM | Admit: 2024-09-14 | Discharge: 2024-09-15 | Disposition: A | Attending: Emergency Medicine | Admitting: Emergency Medicine

## 2024-09-14 ENCOUNTER — Other Ambulatory Visit: Payer: Self-pay

## 2024-09-14 ENCOUNTER — Encounter (HOSPITAL_COMMUNITY): Payer: Self-pay

## 2024-09-14 DIAGNOSIS — J45909 Unspecified asthma, uncomplicated: Secondary | ICD-10-CM | POA: Insufficient documentation

## 2024-09-14 DIAGNOSIS — Z87891 Personal history of nicotine dependence: Secondary | ICD-10-CM | POA: Diagnosis not present

## 2024-09-14 DIAGNOSIS — R42 Dizziness and giddiness: Secondary | ICD-10-CM | POA: Insufficient documentation

## 2024-09-14 LAB — COMPREHENSIVE METABOLIC PANEL WITH GFR
ALT: 18 U/L (ref 0–44)
AST: 22 U/L (ref 15–41)
Albumin: 4.2 g/dL (ref 3.5–5.0)
Alkaline Phosphatase: 43 U/L (ref 38–126)
Anion gap: 12 (ref 5–15)
BUN: 11 mg/dL (ref 8–23)
CO2: 27 mmol/L (ref 22–32)
Calcium: 9.4 mg/dL (ref 8.9–10.3)
Chloride: 101 mmol/L (ref 98–111)
Creatinine, Ser: 0.33 mg/dL — ABNORMAL LOW (ref 0.44–1.00)
GFR, Estimated: >60 mL/min
Glucose, Bld: 98 mg/dL (ref 70–99)
Potassium: 4.4 mmol/L (ref 3.5–5.1)
Sodium: 140 mmol/L (ref 135–145)
Total Bilirubin: 0.4 mg/dL (ref 0.0–1.2)
Total Protein: 7.2 g/dL (ref 6.5–8.1)

## 2024-09-14 LAB — CBC
HCT: 40.9 % (ref 36.0–46.0)
Hemoglobin: 13.3 g/dL (ref 12.0–15.0)
MCH: 30.4 pg (ref 26.0–34.0)
MCHC: 32.5 g/dL (ref 30.0–36.0)
MCV: 93.6 fL (ref 80.0–100.0)
Platelets: 222 K/uL (ref 150–400)
RBC: 4.37 MIL/uL (ref 3.87–5.11)
RDW: 12.7 % (ref 11.5–15.5)
WBC: 6.1 K/uL (ref 4.0–10.5)
nRBC: 0 % (ref 0.0–0.2)

## 2024-09-14 LAB — CBG MONITORING, ED: Glucose-Capillary: 96 mg/dL (ref 70–99)

## 2024-09-14 NOTE — ED Triage Notes (Signed)
 Patient in ED with complaints of weakness and lightheadedness that started several days ago. She says today has been worse. Denies tingling or numbness.  She states she has tinnitus and blocked tear ducts. She had been taking tobamycin eye drops for the past month to help with the tear ducts.

## 2024-09-15 MED ORDER — MECLIZINE HCL 25 MG PO TABS: 25.0000 mg | ORAL_TABLET | Freq: Three times a day (TID) | ORAL | 0 refills | Status: AC | PRN

## 2024-09-15 NOTE — ED Provider Notes (Signed)
 " [facility] DEPT [facility] Emergency Department Provider Note MRN:  [id number]  Arrival date & time: 09/15/24     Chief Complaint   Dizziness History of Present Illness   Haley Kennedy is a 79 y.o. year-old female with a history of chronic pain presenting to the ED with chief complaint of dizziness.  Patient explains that with certain positions of her head she gets really dizzy and feels like she is going to pass out.  When her head is not in this particular position, she has no symptoms.  Denies any chest pain or shortness of breath, no fever, no cough, no nasal congestion, no numbness or weakness to the arms or legs.  Review of Systems  A thorough review of systems was obtained and all systems are negative except as noted in the HPI and PMH.   Patient's Health History    Past Medical History:  Diagnosis Date   Allergy    Arthritis    low back and neck   Asthma    as a child   Chronic back pain    CMT (Charcot-Marie-Tooth disease)    Diverticulosis    Gastric ulcer    Hyperlipidemia    Hyperlipidemia    Peripheral neuropathy    Rotator cuff (capsule) sprain 07/09/2011   Shortness of breath    with exertion;pt states its bc shes not in shape    Swelling    from knee down;takes furosemide  daily   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   Ulcer     Past Surgical History:  Procedure Laterality Date   ABDOMINAL HYSTERECTOMY     87yrs ago   BACK SURGERY     after back surgery had to have iron infusion   BACK SURGERY     2009   CHOLECYSTECTOMY     1997   FOOT SURGERY     1963   FRACTURE SURGERY     KNEE SURGERY     SPINE SURGERY     TONSILLECTOMY      Family History  Problem Relation Age of Onset   Pneumonia Father    Cancer Father        tonsillar cancer    Social History   Socioeconomic History   Marital status: Married    Spouse name: Not on file   Number of children: Not on file   Years of education: Not on file    Highest education level: Professional school degree (e.g., MD, DDS, DVM, JD)  Occupational History   Not on file  Tobacco Use   Smoking status: Former    Current packs/day: 0.00    Types: Cigarettes    Quit date: 08/27/1978    Years since quitting: 46.0   Smokeless tobacco: Never  Vaping Use   Vaping status: Never Used  Substance and Sexual Activity   Alcohol use: No   Drug use: No   Sexual activity: Never  Other Topics Concern   Not on file  Social History Narrative   Not on file   Social Drivers of Health   Tobacco Use: Medium Risk (09/14/2024)   Patient History    Smoking Tobacco Use: Former    Smokeless Tobacco Use: Never    Passive Exposure: Not on file  Financial Resource Strain: Low Risk (03/10/2024)   Overall Financial Resource Strain (CARDIA)    Difficulty of Paying Living Expenses: Not hard at all  Food Insecurity: No Food Insecurity (05/12/2024)  [facility]   Epic    Within the past 12 months, you worried that your food would run out before you got the money to buy more.: Never true    Within the past 12 months, the food you bought just didn't last and you didn't have money to get more.: Never true  Transportation Needs: No Transportation Needs (05/12/2024)   Received from [facility] - Transportation    Lack of Transportation (Medical): No    Lack of Transportation (Non-Medical): No  Physical Activity: Sufficiently Active (03/10/2024)   Exercise Vital Sign    Days of Exercise per Week: 7 days    Minutes of Exercise per Session: 50 min  Stress: No Stress Concern Present (03/10/2024)   [employer]ational [employer] - Occupational Stress Questionnaire    Feeling of Stress: Only a little  Social Connections: Socially Integrated (03/10/2024)   Social Connection and Isolation Panel    Frequency of Communication with Friends and Family: More than three times a week    Frequency of Social Gatherings with Friends and Family:  Twice a week    Attends Religious Services: More than 4 times per year    Active Member of [employer] or Organizations: Yes    Attends Engineer, Structural: More than 4 times per year    Marital Status: Married  Catering Manager Violence: Not At Risk (10/05/2022)   Humiliation, Afraid, Rape, and Kick questionnaire    Fear of Current or Ex-Partner: No    Emotionally Abused: No    Physically Abused: No    Sexually Abused: No  Depression (PHQ2-9): Low Risk (03/10/2024)   Depression (PHQ2-9)    PHQ-2 Score: 0  Alcohol Screen: Not on file  Housing: Unknown (03/10/2024)   Epic    Unable to Pay for Housing in the Last Year: No    Number of Times Moved in the Last Year: Not on file    Homeless in the Last Year: No  Utilities: Low Risk (05/12/2024)   Received from [facility]   Utilities    Within the past 12 months, have you been unable to get utilities(heat, electricity) when it was really needed?: No  Health Literacy: Not on file     Physical Exam   Vitals:   09/14/24 1802 09/14/24 2238  BP: 128/66 (!) 129/55  Pulse: 79 78  Resp: 18 16  Temp: 97.7 F (36.5 C) 98.4 F (36.9 C)  SpO2: 98% 96%    CONSTITUTIONAL: Well-appearing, NAD NEURO/PSYCH:  Alert and oriented x 3, wheelchair-bound EYES:  eyes equal and reactive ENT/NECK:  no LAD, no JVD CARDIO: Regular rate, well-perfused, normal S1 and S2 PULM:  CTAB no wheezing or rhonchi GI/GU:  non-distended, non-tender MSK/SPINE:  No gross deformities, no edema SKIN:  no rash, atraumatic   *Additional and/or pertinent findings included in MDM below  Diagnostic and Interventional Summary    EKG Interpretation Date/Time:    Ventricular Rate:    PR Interval:    QRS Duration:    QT Interval:    QTC Calculation:   R Axis:      Text Interpretation:         Labs Reviewed  COMPREHENSIVE METABOLIC PANEL WITH GFR - Abnormal; Notable for the following components:      Result Value   Creatinine, Ser 0.33 (*)    All  other components within normal limits  CBC  URINALYSIS, ROUTINE W REFLEX MICROSCOPIC  CBG MONITORING, ED    No orders to display    Medications - No data to display   Procedures  /  Critical Care Procedures  ED Course and Medical Decision Making  Initial Impression and Ddx 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  In a wheelchair.  History suggests peripheral vertigo.  No other symptoms to suggest central vertigo, reassuring vitals, no other signs or symptoms to suggest emergent process.  Past medical/surgical history that increases complexity of ED encounter: CIDP  Interpretation of Diagnostics I personally reviewed the Laboratory Testing and my interpretation is as follows: No significant blood count or electrolyte disturbance.    Patient Reassessment and Ultimate Disposition/Management     Discharge  Patient management required discussion with the following services or consulting groups:  None  Complexity of Problems Addressed Acute illness or injury that poses threat of life of bodily function  Additional Data Reviewed and Analyzed Further history obtained from: Further history from spouse/family member  Additional Factors Impacting ED Encounter Risk Prescriptions  Ozell HERO. Theadore, MD [facility] Emergency Medicine [facility] [email or web address]  Final Clinical Impressions(s) / ED Diagnoses     ICD-10-CM   1. Vertigo  R42       ED Discharge Orders          Ordered    meclizine  (ANTIVERT ) 25 MG tablet  3 times daily PRN        09/15/24 0237             Discharge Instructions Discussed with and Provided to Patient:    Discharge Instructions      You were evaluated in the Emergency Department and after careful evaluation, we did not find any emergent condition requiring admission or further testing in the hospital.  Your exam/testing today is overall reassuring.  Symptoms likely due to vertigo.  Take the meclizine  medication as  needed, follow-up with your primary care doctor.  Follow-up with your ophthalmologist regarding your eye issues.  Please return to the Emergency Department if you experience any worsening of your condition.   Thank you for allowing us  to be a part of your care.      Theadore Ozell HERO, MD 09/15/24 [phone]  "

## 2024-09-15 NOTE — Discharge Instructions (Signed)
 You were evaluated in the Emergency Department and after careful evaluation, we did not find any emergent condition requiring admission or further testing in the hospital.  Your exam/testing today is overall reassuring.  Symptoms likely due to vertigo.  Take the meclizine  medication as needed, follow-up with your primary care doctor.  Follow-up with your ophthalmologist regarding your eye issues.  Please return to the Emergency Department if you experience any worsening of your condition.   Thank you for allowing us  to be a part of your care.
